# Patient Record
Sex: Female | Born: 1935
Health system: Southern US, Community
[De-identification: ages and names within clinical notes are randomized; demographics above are authoritative.]

## PROBLEM LIST (undated history)

## (undated) DIAGNOSIS — E7800 Pure hypercholesterolemia, unspecified: Secondary | ICD-10-CM

## (undated) DIAGNOSIS — I447 Left bundle-branch block, unspecified: Secondary | ICD-10-CM

## (undated) DIAGNOSIS — R55 Syncope and collapse: Secondary | ICD-10-CM

## (undated) DIAGNOSIS — E559 Vitamin D deficiency, unspecified: Secondary | ICD-10-CM

## (undated) DIAGNOSIS — N183 Chronic kidney disease, stage 3 (moderate): Secondary | ICD-10-CM

## (undated) DIAGNOSIS — I10 Essential (primary) hypertension: Secondary | ICD-10-CM

## (undated) DIAGNOSIS — H3530 Unspecified macular degeneration: Secondary | ICD-10-CM

## (undated) DIAGNOSIS — D649 Anemia, unspecified: Secondary | ICD-10-CM

## (undated) DIAGNOSIS — K219 Gastro-esophageal reflux disease without esophagitis: Secondary | ICD-10-CM

## (undated) HISTORY — DX: Syncope and collapse: R55

## (undated) HISTORY — DX: Anemia, unspecified: D64.9

## (undated) HISTORY — DX: Chronic kidney disease, stage 3 (moderate): N18.3

## (undated) HISTORY — DX: Gastro-esophageal reflux disease without esophagitis: K21.9

## (undated) HISTORY — DX: Unspecified macular degeneration: H35.30

## (undated) HISTORY — DX: Vitamin D deficiency, unspecified: E55.9

## (undated) HISTORY — DX: Left bundle-branch block, unspecified: I44.7

---

## 2015-05-23 DIAGNOSIS — R51 Headache: Secondary | ICD-10-CM | POA: Diagnosis not present

## 2015-05-23 DIAGNOSIS — E784 Other hyperlipidemia: Secondary | ICD-10-CM | POA: Diagnosis not present

## 2015-05-23 DIAGNOSIS — J309 Allergic rhinitis, unspecified: Secondary | ICD-10-CM | POA: Diagnosis not present

## 2015-05-23 DIAGNOSIS — I10 Essential (primary) hypertension: Secondary | ICD-10-CM | POA: Diagnosis not present

## 2015-05-24 DIAGNOSIS — H353221 Exudative age-related macular degeneration, left eye, with active choroidal neovascularization: Secondary | ICD-10-CM | POA: Diagnosis not present

## 2015-08-16 DIAGNOSIS — H35361 Drusen (degenerative) of macula, right eye: Secondary | ICD-10-CM | POA: Diagnosis not present

## 2015-08-16 DIAGNOSIS — H353221 Exudative age-related macular degeneration, left eye, with active choroidal neovascularization: Secondary | ICD-10-CM | POA: Diagnosis not present

## 2015-08-26 DIAGNOSIS — Z79891 Long term (current) use of opiate analgesic: Secondary | ICD-10-CM | POA: Diagnosis not present

## 2015-08-26 DIAGNOSIS — I10 Essential (primary) hypertension: Secondary | ICD-10-CM | POA: Diagnosis not present

## 2015-08-26 DIAGNOSIS — E784 Other hyperlipidemia: Secondary | ICD-10-CM | POA: Diagnosis not present

## 2015-08-26 DIAGNOSIS — K219 Gastro-esophageal reflux disease without esophagitis: Secondary | ICD-10-CM | POA: Diagnosis not present

## 2015-10-05 DIAGNOSIS — Z1231 Encounter for screening mammogram for malignant neoplasm of breast: Secondary | ICD-10-CM | POA: Diagnosis not present

## 2015-10-05 LAB — HM MAMMOGRAPHY

## 2015-10-26 DIAGNOSIS — H353221 Exudative age-related macular degeneration, left eye, with active choroidal neovascularization: Secondary | ICD-10-CM | POA: Diagnosis not present

## 2015-12-30 DIAGNOSIS — E784 Other hyperlipidemia: Secondary | ICD-10-CM | POA: Diagnosis not present

## 2015-12-30 DIAGNOSIS — K635 Polyp of colon: Secondary | ICD-10-CM | POA: Diagnosis not present

## 2015-12-30 DIAGNOSIS — I10 Essential (primary) hypertension: Secondary | ICD-10-CM | POA: Diagnosis not present

## 2015-12-30 DIAGNOSIS — Z79891 Long term (current) use of opiate analgesic: Secondary | ICD-10-CM | POA: Diagnosis not present

## 2015-12-30 LAB — HEPATIC FUNCTION PANEL
ALK PHOS: 84 (ref 25–125)
ALT: 14 (ref 7–35)
AST: 18 (ref 13–35)
BILIRUBIN, TOTAL: 0.7

## 2015-12-30 LAB — LIPID PANEL
Cholesterol: 177 (ref 0–200)
HDL: 59 (ref 35–70)
LDL CALC: 87
LDl/HDL Ratio: 1.5
TRIGLYCERIDES: 156 (ref 40–160)

## 2015-12-30 LAB — CBC AND DIFFERENTIAL
HCT: 41 (ref 36–46)
Hemoglobin: 13.1 (ref 12.0–16.0)
Platelets: 289 (ref 150–399)
WBC: 7.8

## 2015-12-30 LAB — BASIC METABOLIC PANEL
BUN: 16 (ref 4–21)
CREATININE: 1.4 — AB (ref <=1.1)
GLUCOSE: 97
Potassium: 4.3 (ref 3.4–5.3)
Sodium: 141 (ref 137–147)

## 2016-01-17 DIAGNOSIS — H353221 Exudative age-related macular degeneration, left eye, with active choroidal neovascularization: Secondary | ICD-10-CM | POA: Diagnosis not present

## 2016-01-17 DIAGNOSIS — H35363 Drusen (degenerative) of macula, bilateral: Secondary | ICD-10-CM | POA: Diagnosis not present

## 2016-01-25 DIAGNOSIS — Z86010 Personal history of colonic polyps: Secondary | ICD-10-CM | POA: Diagnosis not present

## 2016-01-25 DIAGNOSIS — K219 Gastro-esophageal reflux disease without esophagitis: Secondary | ICD-10-CM | POA: Diagnosis not present

## 2016-03-12 DIAGNOSIS — Z0000 Encounter for general adult medical examination without abnormal findings: Secondary | ICD-10-CM | POA: Diagnosis not present

## 2016-03-20 DIAGNOSIS — H26493 Other secondary cataract, bilateral: Secondary | ICD-10-CM | POA: Diagnosis not present

## 2016-04-06 DIAGNOSIS — L259 Unspecified contact dermatitis, unspecified cause: Secondary | ICD-10-CM | POA: Diagnosis not present

## 2016-04-06 DIAGNOSIS — I10 Essential (primary) hypertension: Secondary | ICD-10-CM | POA: Diagnosis not present

## 2016-04-06 DIAGNOSIS — M25519 Pain in unspecified shoulder: Secondary | ICD-10-CM | POA: Diagnosis not present

## 2016-04-11 DIAGNOSIS — H353221 Exudative age-related macular degeneration, left eye, with active choroidal neovascularization: Secondary | ICD-10-CM | POA: Diagnosis not present

## 2016-07-04 ENCOUNTER — Encounter (INDEPENDENT_AMBULATORY_CARE_PROVIDER_SITE_OTHER): Payer: Medicare Other | Admitting: Ophthalmology

## 2016-07-04 DIAGNOSIS — H43813 Vitreous degeneration, bilateral: Secondary | ICD-10-CM | POA: Diagnosis not present

## 2016-07-04 DIAGNOSIS — H353221 Exudative age-related macular degeneration, left eye, with active choroidal neovascularization: Secondary | ICD-10-CM

## 2016-07-04 DIAGNOSIS — H35033 Hypertensive retinopathy, bilateral: Secondary | ICD-10-CM

## 2016-07-04 DIAGNOSIS — I10 Essential (primary) hypertension: Secondary | ICD-10-CM | POA: Diagnosis not present

## 2016-07-25 DIAGNOSIS — I10 Essential (primary) hypertension: Secondary | ICD-10-CM | POA: Diagnosis not present

## 2016-07-25 DIAGNOSIS — M159 Polyosteoarthritis, unspecified: Secondary | ICD-10-CM | POA: Diagnosis not present

## 2016-07-25 DIAGNOSIS — K219 Gastro-esophageal reflux disease without esophagitis: Secondary | ICD-10-CM | POA: Diagnosis not present

## 2016-07-25 DIAGNOSIS — E785 Hyperlipidemia, unspecified: Secondary | ICD-10-CM | POA: Diagnosis not present

## 2016-09-01 ENCOUNTER — Emergency Department (HOSPITAL_COMMUNITY): Payer: Medicare Other

## 2016-09-01 ENCOUNTER — Encounter (HOSPITAL_COMMUNITY): Payer: Self-pay | Admitting: *Deleted

## 2016-09-01 ENCOUNTER — Observation Stay (HOSPITAL_COMMUNITY)
Admission: EM | Admit: 2016-09-01 | Discharge: 2016-09-02 | Disposition: A | Discharge status: Home / Self Care | Payer: Medicare Other | Attending: Internal Medicine | Admitting: Internal Medicine

## 2016-09-01 ENCOUNTER — Observation Stay (HOSPITAL_COMMUNITY): Payer: Medicare Other

## 2016-09-01 DIAGNOSIS — R404 Transient alteration of awareness: Secondary | ICD-10-CM | POA: Diagnosis not present

## 2016-09-01 DIAGNOSIS — Z79899 Other long term (current) drug therapy: Secondary | ICD-10-CM | POA: Insufficient documentation

## 2016-09-01 DIAGNOSIS — M25512 Pain in left shoulder: Secondary | ICD-10-CM | POA: Insufficient documentation

## 2016-09-01 DIAGNOSIS — R52 Pain, unspecified: Secondary | ICD-10-CM

## 2016-09-01 DIAGNOSIS — I447 Left bundle-branch block, unspecified: Secondary | ICD-10-CM | POA: Diagnosis not present

## 2016-09-01 DIAGNOSIS — N1832 Chronic kidney disease, stage 3b: Secondary | ICD-10-CM | POA: Diagnosis present

## 2016-09-01 DIAGNOSIS — R42 Dizziness and giddiness: Secondary | ICD-10-CM | POA: Diagnosis not present

## 2016-09-01 DIAGNOSIS — N179 Acute kidney failure, unspecified: Secondary | ICD-10-CM | POA: Diagnosis not present

## 2016-09-01 DIAGNOSIS — R55 Syncope and collapse: Principal | ICD-10-CM | POA: Insufficient documentation

## 2016-09-01 DIAGNOSIS — I10 Essential (primary) hypertension: Secondary | ICD-10-CM | POA: Insufficient documentation

## 2016-09-01 DIAGNOSIS — E785 Hyperlipidemia, unspecified: Secondary | ICD-10-CM | POA: Diagnosis not present

## 2016-09-01 DIAGNOSIS — N183 Chronic kidney disease, stage 3 unspecified: Secondary | ICD-10-CM

## 2016-09-01 DIAGNOSIS — Z7982 Long term (current) use of aspirin: Secondary | ICD-10-CM | POA: Diagnosis not present

## 2016-09-01 HISTORY — DX: Syncope and collapse: R55

## 2016-09-01 HISTORY — DX: Left bundle-branch block, unspecified: I44.7

## 2016-09-01 HISTORY — DX: Chronic kidney disease, stage 3 unspecified: N18.30

## 2016-09-01 HISTORY — DX: Essential (primary) hypertension: I10

## 2016-09-01 HISTORY — DX: Pure hypercholesterolemia, unspecified: E78.00

## 2016-09-01 LAB — BASIC METABOLIC PANEL
Anion gap: 9 (ref 5–15)
BUN: 18 mg/dL (ref 6–20)
CHLORIDE: 105 mmol/L (ref 101–111)
CO2: 25 mmol/L (ref 22–32)
Calcium: 9 mg/dL (ref 8.9–10.3)
Creatinine, Ser: 1.4 mg/dL — ABNORMAL HIGH (ref 0.44–1.00)
GFR calc Af Amer: 39 mL/min — ABNORMAL LOW (ref >60)
GFR calc non Af Amer: 33 mL/min — ABNORMAL LOW (ref >60)
Glucose, Bld: 99 mg/dL (ref 65–99)
POTASSIUM: 4.2 mmol/L (ref 3.5–5.1)
SODIUM: 139 mmol/L (ref 135–145)

## 2016-09-01 LAB — CBC
HCT: 35.5 % — ABNORMAL LOW (ref 36.0–46.0)
HEMATOCRIT: 36.6 % (ref 36.0–46.0)
Hemoglobin: 12.1 g/dL (ref 12.0–15.0)
Hemoglobin: 11.8 g/dL — ABNORMAL LOW (ref 12.0–15.0)
MCH: 27 pg (ref 26.0–34.0)
MCH: 26.9 pg (ref 26.0–34.0)
MCHC: 33.1 g/dL (ref 30.0–36.0)
MCHC: 33.2 g/dL (ref 30.0–36.0)
MCV: 81.7 fL (ref 78.0–100.0)
MCV: 80.9 fL (ref 78.0–100.0)
Platelets: 284 10*3/uL (ref 150–400)
Platelets: 302 10*3/uL (ref 150–400)
RBC: 4.48 MIL/uL (ref 3.87–5.11)
RBC: 4.39 MIL/uL (ref 3.87–5.11)
RDW: 13.7 % (ref 11.5–15.5)
RDW: 13.7 % (ref 11.5–15.5)
WBC: 7.4 10*3/uL (ref 4.0–10.5)
WBC: 9.1 10*3/uL (ref 4.0–10.5)

## 2016-09-01 LAB — D-DIMER, QUANTITATIVE: D-Dimer, Quant: 0.4 ug/mL-FEU (ref 0.00–0.50)

## 2016-09-01 LAB — CREATININE, SERUM
CREATININE: 1.21 mg/dL — AB (ref 0.44–1.00)
GFR calc Af Amer: 46 mL/min — ABNORMAL LOW (ref >60)
GFR calc non Af Amer: 40 mL/min — ABNORMAL LOW (ref >60)

## 2016-09-01 LAB — I-STAT TROPONIN, ED: Troponin i, poc: 0 ng/mL (ref 0.00–0.08)

## 2016-09-01 LAB — TROPONIN I

## 2016-09-01 IMAGING — DX DG SHOULDER 2+V*L*
3 series · 4 of 4 positions shown · non-contrast
Comparison: None.

CLINICAL DATA: Left shoulder pain for 3 weeks, no known injury.

EXAM:
LEFT SHOULDER - 2+ VIEW

[Series 1: shoulder grashey · 0.14mm/px · 2 of 2 slices shown]
[im 1/2]
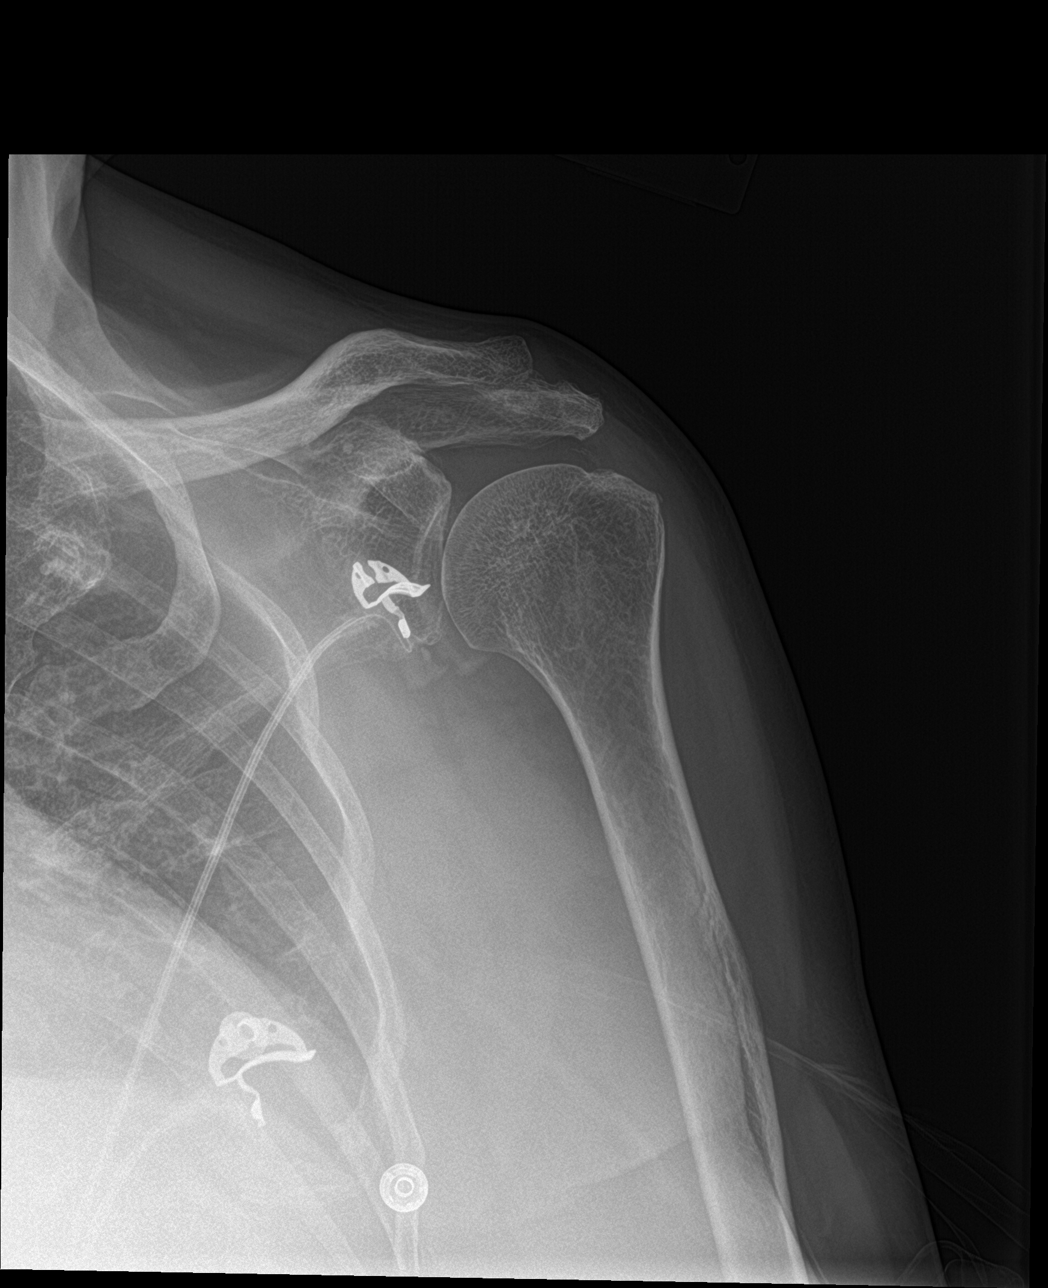
[im 2/2]
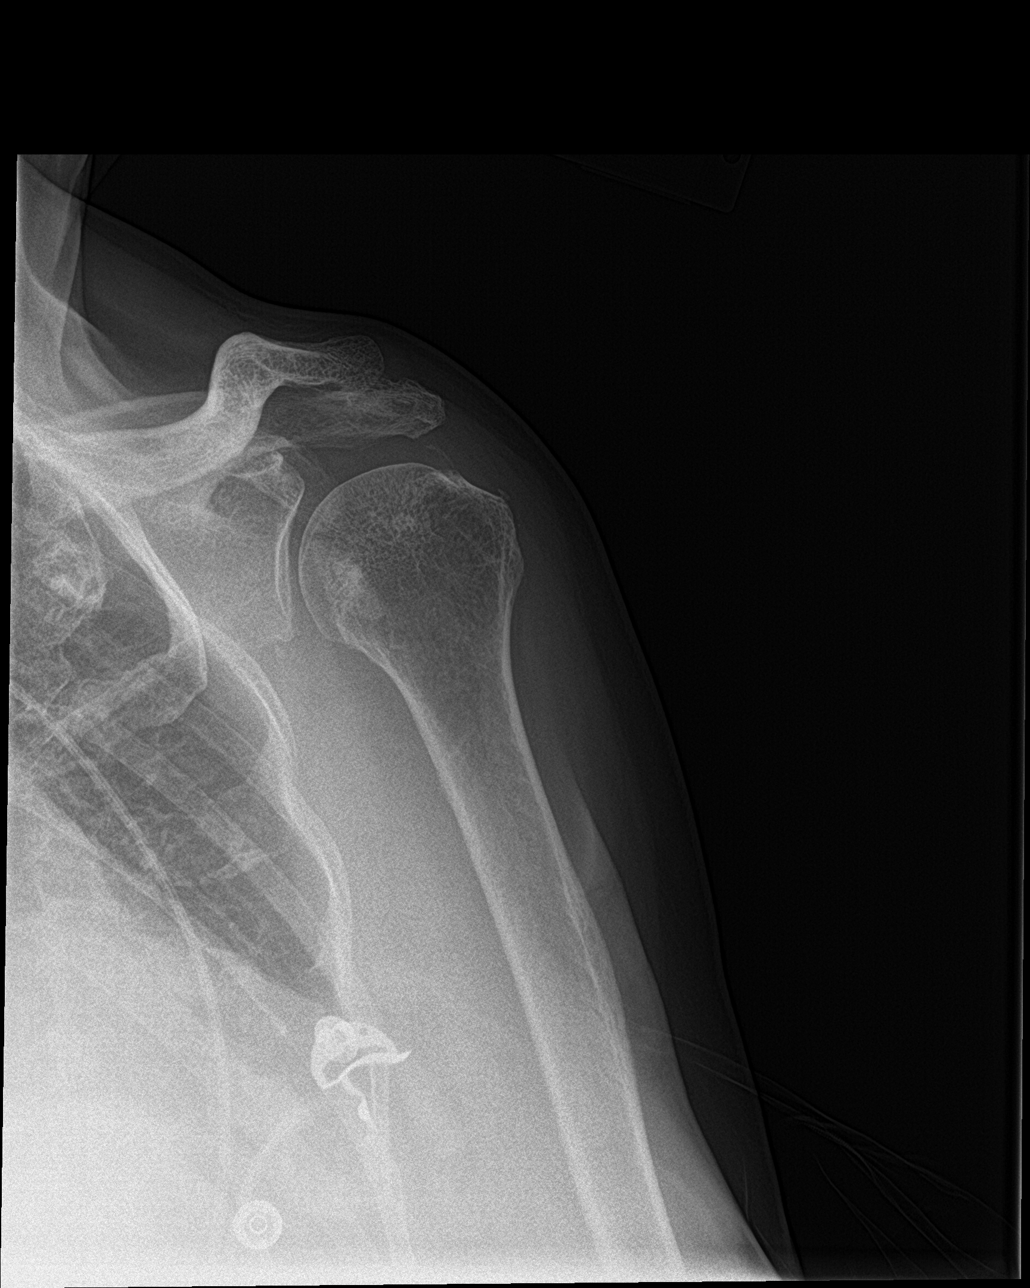

[shoulder y view]
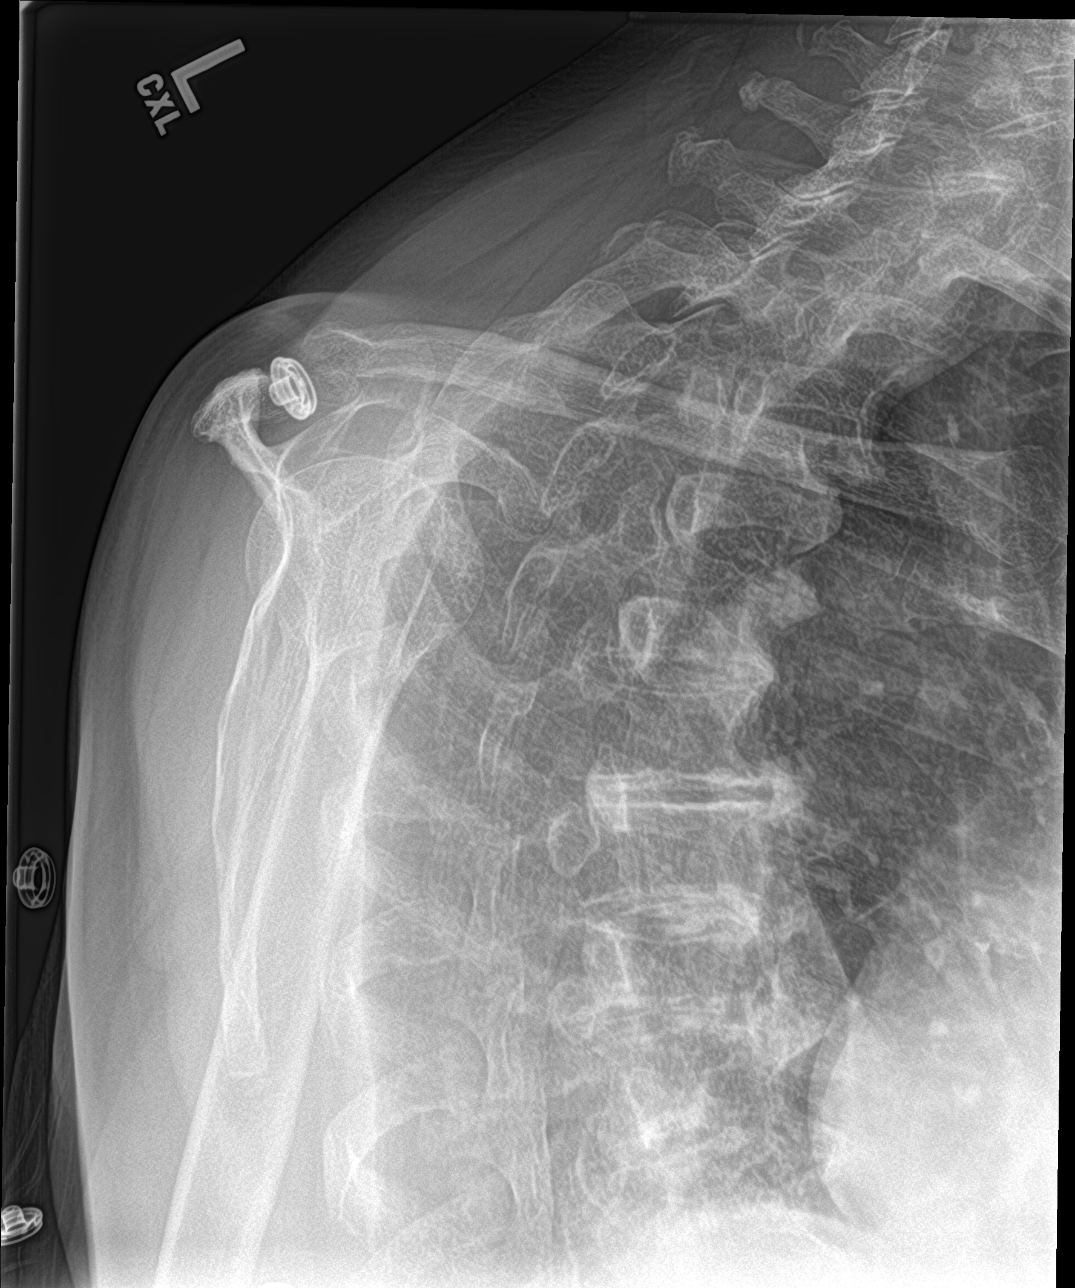

[shoulder axillary]
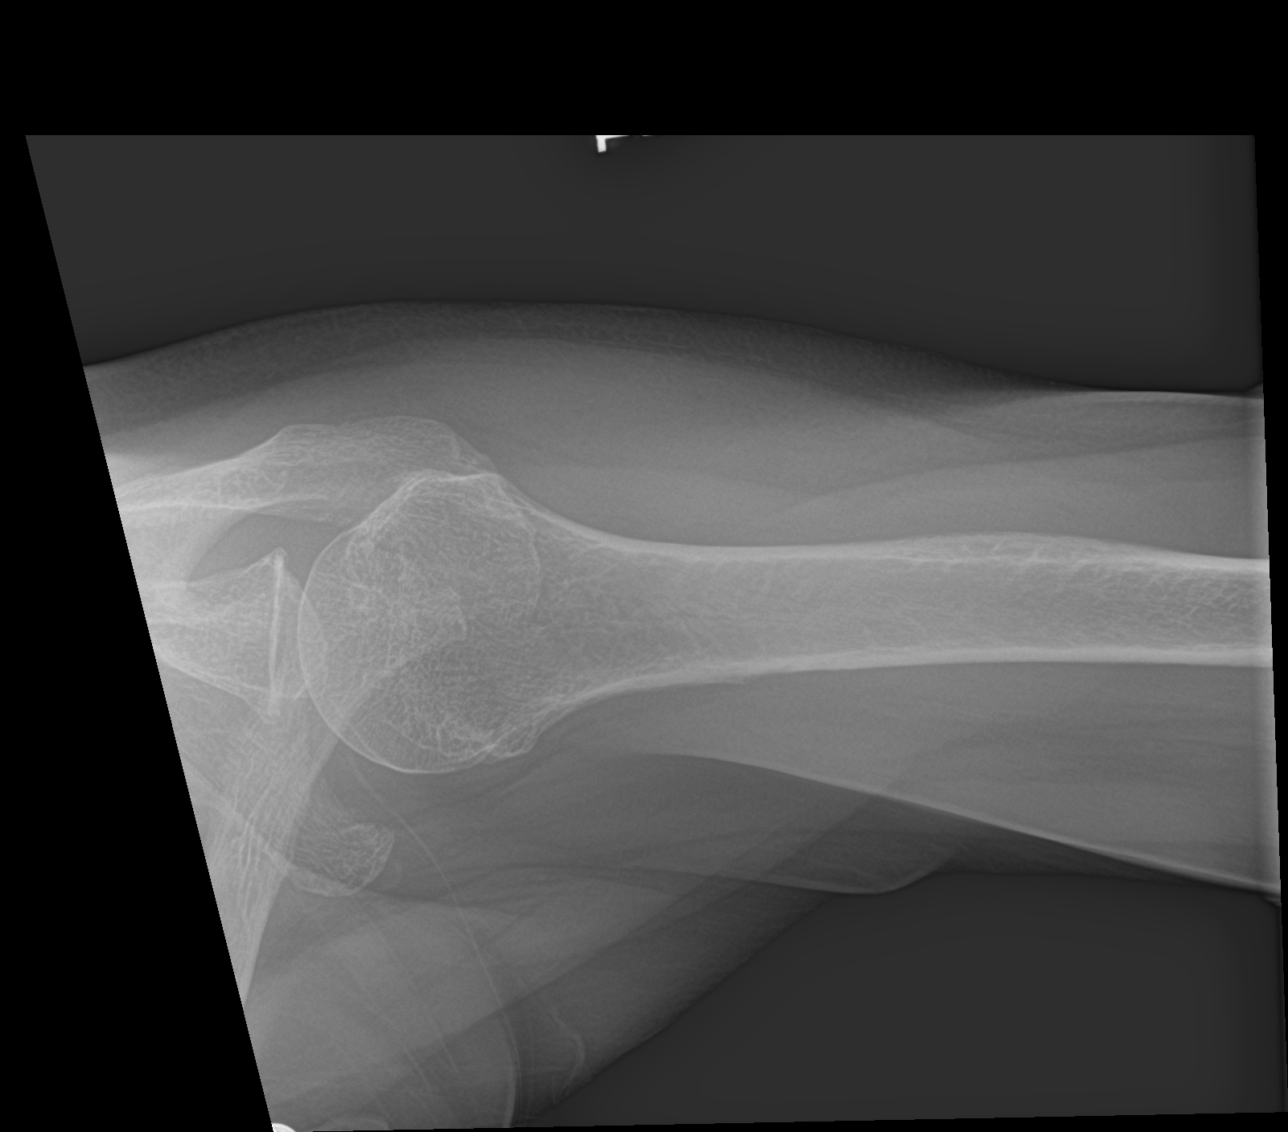

[4 of 4 positions shown; findings below may reference images not displayed]

FINDINGS: Osseous alignment is normal. No fracture seen. Mild degenerative
spurring noted along the inferior margin of the glenoid. Subtle
calcific density overlies the left humeral head, likely related to
chronic calcific tendinopathy of the rotator cuff.
IMPRESSION: 1. No acute findings.  No osseous fracture or dislocation.
2. Subtle calcific changes overlying the left humeral head,
suggesting chronic calcific tendinopathy of the rotator cuff.
Probable additional degenerative spurring along the inferior margin
of the glenoid labrum.

## 2016-09-01 MED ORDER — ASPIRIN EC 81 MG PO TBEC
81.0000 mg | DELAYED_RELEASE_TABLET | Freq: Every day | ORAL | Status: DC
  Administered 2016-09-01 – 2016-09-02 (×2): 81 mg via ORAL
  Filled 2016-09-01 (×2): qty 1

## 2016-09-01 MED ORDER — AMLODIPINE BESYLATE 5 MG PO TABS
5.0000 mg | ORAL_TABLET | Freq: Every day | ORAL | Status: DC
  Administered 2016-09-01 – 2016-09-02 (×2): 5 mg via ORAL
  Filled 2016-09-01 (×2): qty 1

## 2016-09-01 MED ORDER — PRAVASTATIN SODIUM 20 MG PO TABS
20.0000 mg | ORAL_TABLET | Freq: Every day | ORAL | Status: DC
  Administered 2016-09-01 – 2016-09-02 (×2): 20 mg via ORAL
  Filled 2016-09-01 (×2): qty 1

## 2016-09-01 MED ORDER — ACETAMINOPHEN 325 MG PO TABS: 650.0000 mg | ORAL_TABLET | Freq: Four times a day (QID) | ORAL | Status: DC | PRN

## 2016-09-01 MED ORDER — SODIUM CHLORIDE 0.9% FLUSH
3.0000 mL | Freq: Two times a day (BID) | INTRAVENOUS | Status: DC
  Administered 2016-09-02: 3 mL via INTRAVENOUS

## 2016-09-01 MED ORDER — CHOLECALCIFEROL 25 MCG (1000 UT) PO CAPS: 1000.0000 [IU] | ORAL_CAPSULE | Freq: Every day | ORAL | Status: DC

## 2016-09-01 MED ORDER — OMEGA-3-ACID ETHYL ESTERS 1 G PO CAPS
1.0000 g | ORAL_CAPSULE | Freq: Two times a day (BID) | ORAL | Status: DC
  Administered 2016-09-01 – 2016-09-02 (×2): 1 g via ORAL
  Filled 2016-09-01 (×2): qty 1

## 2016-09-01 MED ORDER — VITAMIN D 1000 UNITS PO TABS
1000.0000 [IU] | ORAL_TABLET | Freq: Every day | ORAL | Status: DC
  Administered 2016-09-02: 1000 [IU] via ORAL
  Filled 2016-09-01 (×2): qty 1

## 2016-09-01 MED ORDER — ACETAMINOPHEN 650 MG RE SUPP: 650.0000 mg | Freq: Four times a day (QID) | RECTAL | Status: DC | PRN

## 2016-09-01 MED ORDER — PANTOPRAZOLE SODIUM 40 MG PO TBEC
40.0000 mg | DELAYED_RELEASE_TABLET | Freq: Every day | ORAL | Status: DC
  Administered 2016-09-01 – 2016-09-02 (×2): 40 mg via ORAL
  Filled 2016-09-01 (×2): qty 1

## 2016-09-01 MED ORDER — ENOXAPARIN SODIUM 30 MG/0.3ML ~~LOC~~ SOLN
30.0000 mg | SUBCUTANEOUS | Status: DC
  Administered 2016-09-01: 30 mg via SUBCUTANEOUS
  Filled 2016-09-01: qty 0.3

## 2016-09-01 MED ORDER — SODIUM CHLORIDE 0.9 % IV SOLN
INTRAVENOUS | Status: DC
  Administered 2016-09-01 – 2016-09-02 (×2): via INTRAVENOUS

## 2016-09-01 NOTE — ED Provider Notes (Signed)
[facility]-EMERGENCY DEPT Provider Note   CSN: [id number] Arrival date & time: 09/01/16  1307     History   Chief Complaint Chief Complaint  Patient presents with  . Loss of Consciousness  . Chest Pain    HPI Whitney Davidson is a 83 y.o. female.  HPI  83 year old female history of hypertension and high cholesterol who presents today complaining of left shoulder pain and syncope. She states that she is having intermittent left shoulder pain for the past month. It comes and goes. She cannot tell you what brings it on. She has had no trauma. Pain is posterior aspect of the shoulder but is not reproducible. Today she was sitting and talking to her nephew when she stated that she felt lightheaded. She then had a syncopal episode slumping in the chair. He caught her before she fell out. He states that last about 10 seconds. There is no evidence of seizure activity without loss of bowel or bladder control. After the event she was awake and alert. She is not currently having left shoulder pain. She has not had any previous syncopal episodes.  Past Medical History:  Diagnosis Date  . High cholesterol   . Hypertension     There are no active problems to display for this patient.   History reviewed. No pertinent surgical history.  OB History    No data available       Home Medications    Prior to Admission medications   Not on File    Family History No family history on file.  Social History Social History  Substance Use Topics  . Smoking status: Never Smoker  . Smokeless tobacco: Never Used  . Alcohol use No     Allergies   Patient has no allergy information on record.   Review of Systems Review of Systems  All other systems reviewed and are negative.    Physical Exam Updated Vital Signs BP (!) 158/71   Pulse 63   Temp 98.4 F (36.9 C) (Oral)   Resp 11   SpO2 100%   Physical Exam  Constitutional: She is oriented to person, place, and time. She appears  well-developed and well-nourished. No distress.  HENT:  Head: Normocephalic and atraumatic.  Right Ear: External ear normal.  Left Ear: External ear normal.  Mouth/Throat: Oropharynx is clear and moist.  Eyes: EOM are normal. Pupils are equal, round, and reactive to light.  Neck: Normal range of motion. Neck supple.  Cardiovascular: Normal rate, regular rhythm and normal heart sounds.   Pulmonary/Chest: Effort normal.  Abdominal: Soft.  Musculoskeletal: Normal range of motion.  Neurological: She is alert and oriented to person, place, and time.  Nursing note and vitals reviewed.    ED Treatments / Results  Labs (all labs ordered are listed, but only abnormal results are displayed) Labs Reviewed  CBC  BASIC METABOLIC PANEL  I-STAT TROPOININ, ED    EKG  EKG Interpretation  Date/Time:  Saturday Sep 01 2016 13:14:47 EDT Ventricular Rate:  62 PR Interval:    QRS Duration: 128 QT Interval:  443 QTC Calculation: 450 R Axis:   62 Text Interpretation:  Sinus or ectopic atrial rhythm Prolonged PR interval Left bundle branch block Confirmed by Tyjuan Demetro MD, Duwayne Heck ([id number]) on 09/01/2016 1:24:13 PM Also confirmed by Breanna Mcdaniel MD, Duwayne Heck [phone]), editor Misty Stanley [id number])  on 09/01/2016 1:40:31 PM       Radiology Dg Chest 2 View  Result Date: 09/01/2016 CLINICAL DATA:  Syncope.  Left arm  pain. EXAM: CHEST  2 VIEW COMPARISON:  None. FINDINGS: The heart size and mediastinal contours are within normal limits. Both lungs are clear. The visualized skeletal structures are unremarkable. IMPRESSION: No active cardiopulmonary disease. Electronically Signed   By: Gerome Samavid  Williams III M.D   On: 09/01/2016 14:09    Procedures Procedures (including critical care time)  Medications Ordered in ED Medications - No data to display   Initial Impression / Assessment and Plan / ED Course  I have reviewed the triage vital signs and the nursing notes.  Pertinent labs & imaging results that were  available during my care of the patient were reviewed by me and considered in my medical decision making (see chart for details).     Patient with left bundle branch block on EKG and no old to compare. Patient is not considered low risk [facility]. Plan admission to unassigned medicine for further evaluation. She has remained hemodynamically stable here in ED. I discussed plan with patient and family.  Final Clinical Impressions(s) / ED Diagnoses   Final diagnoses:  Syncope, unspecified syncope type   Left shoulder pain Discussed with hospitalist and will see for admission New Prescriptions New Prescriptions   No medications on file     Margarita Grizzleay, Jacie Tristan, MD 09/01/16 1541

## 2016-09-01 NOTE — H&P (Addendum)
History and Physical        Hospital Admission Note Date: 09/01/2016  Patient name: Whitney Davidson Medical record number: [id number] Date of birth: 01/22/1931 Age: 83 y.o. Gender: female  PCP:  just relocated from [place], has not established any PCP yet.     Patient coming from: Home   Chief Complaint:  Passed out at home  HPI: Patient is a 83 year old female with history of hypertension, hyperlipidemia, GERD presented from home today with the syncopal episode. 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 Her niece's husband went to get his wife who lives next door. When she arrived, patient had another episode of dizziness and subsequently slumped in the chair for about 10 seconds. Patient denied any chest pain, palpitations, shortness of breath or any diaphoresis. She reported nausea and one episode of vomiting. Patient's family did not notice any seizure activity or loss of bowel or bladder. Patient was alert and oriented after she gained consciousness. Patient denies any prior episodes of syncopal episodes. Per family, she did not have any breakfast yet this morning. Also patient has just relocated 3 weeks ago from [place], she has not established any PCP yet. Patient also has been complaining of left shoulder and arm pain off and on for last 1 month. Per patient's family, she was also complaining of the pain today. However during my examination, patient has no pain. Per patient, the pain feels similar to the prior episodes of bursitis in her right shoulder in the past.  ED work-up/course:  Temp 98.4, pulse 60, BP 145/68 at the time of triage EKG showed rate 62, prolonged PR interval, left bundle branch block, no prior EKG  to compare with BMET showed BUN 18, creatinine 1.4, no prior labs to compare with CBC unremarkable, troponin 0.0  Review of Systems: Positives marked in 'bold' Constitutional: Denies fever, chills, diaphoresis, poor appetite and fatigue.  HEENT: Denies photophobia, eye pain, redness, hearing loss, ear pain, congestion, sore throat, rhinorrhea, sneezing, mouth sores, trouble swallowing, neck pain, neck stiffness and tinnitus.   Respiratory: Denies SOB, DOE, cough, chest tightness,  and wheezing.   Cardiovascular: Denies chest pain, palpitations and leg swelling.  Gastrointestinal: Denies nausea, vomiting, abdominal pain, diarrhea, constipation, blood in stool and abdominal distention.  Genitourinary: Denies dysuria, urgency, frequency, hematuria, flank pain and difficulty urinating.  Musculoskeletal: Denies myalgias, back pain, joint swelling, arthralgias and gait problem. + left shoulder and arm pain for last 1 month off and on Skin: Denies pallor, rash and wound.  Neurological: Please see history of present illness, denies any seizures Hematological: Denies adenopathy. Easy bruising, personal or family bleeding history  Psychiatric/Behavioral: Denies suicidal ideation, mood changes, confusion, nervousness, sleep disturbance and agitation  Past Medical History: Past Medical History:  Diagnosis Date  . High cholesterol   . Hypertension     History reviewed. No pertinent surgical history.  Medications: Prior to Admission medications   Medication Sig Start Date End Date Taking? Authorizing Provider  amLODipine (NORVASC) 5 MG tablet Take 5  mg by mouth daily.   Yes [provider]  aspirin EC 81 MG tablet Take 81 mg by mouth daily.   Yes [provider]  bisoprolol (ZEBETA) 5 MG tablet Take 5 mg by mouth daily.   Yes [provider]  Cholecalciferol (D3-1000) 1000 units capsule Take 1,000 Units by mouth daily.   Yes [provider]  Omega-3 Fatty Acids  (FISH OIL) 1000 MG CAPS Take 1,000 mg by mouth 2 (two) times daily.   Yes [provider]  omeprazole (PRILOSEC) 40 MG capsule Take 40 mg by mouth daily before breakfast.   Yes [provider]  pravastatin (PRAVACHOL) 20 MG tablet Take 20 mg by mouth daily.   Yes [provider]    Allergies:   Allergies  Allergen Reactions  . Lisinopril-Hydrochlorothiazide Swelling    Lips became swollen  . Penicillins     Has patient had a PCN reaction causing immediate rash, facial/tongue/throat swelling, SOB or lightheadedness with hypotension: Yes Has patient had a PCN reaction causing severe rash involving mucus membranes or skin necrosis: Unknown Has patient had a PCN reaction that required hospitalization: Unknown Has patient had a PCN reaction occurring within the last 10 years: No If all of the above answers are "NO", then may proceed with Cephalosporin use.     Social History:  reports that she has never smoked. She has never used smokeless tobacco. She reports that she does not drink alcohol or use drugs.Currently lives at home, patient's niece and her husband lives next door  Family History: The patient reports that her brother had coronary disease and diabetes mellitus Her mother had coronary artery disease No history of cancers in the family  Physical Exam: Blood pressure 133/65, pulse 66, temperature 98 F (36.7 C), temperature source Oral, resp. rate 11, SpO2 97 %. General: Alert, awake, oriented x3, in no acute distress. HEENT: normocephalic, atraumatic, anicteric sclera, pink conjunctiva, pupils equal and reactive to light and accomodation, oropharynx clear Neck: supple, no masses or lymphadenopathy, no goiter, no bruits  Heart: Regular rate and rhythm, without murmurs, rubs or gallops. Lungs: Clear to auscultation bilaterally, no wheezing, rales or rhonchi. Abdomen: Soft, nontender, nondistended, positive bowel sounds, no masses. Extremities: No  clubbing, cyanosis or edema with positive pedal pulses. Range of movement intact in the left shoulder. Neuro: Grossly intact, no focal neurological deficits, strength 5/5 upper and lower extremities bilaterally Psych: alert and oriented x 3, normal mood and affect Skin: no rashes or lesions, warm and dry   LABS on Admission:  Basic Metabolic Panel:  Recent Labs Lab 09/01/16 1330  NA 139  K 4.2  CL 105  CO2 25  GLUCOSE 99  BUN 18  CREATININE 1.40*  CALCIUM 9.0   Liver Function Tests: No results for input(s): AST, ALT, ALKPHOS, BILITOT, PROT, ALBUMIN in the last 168 hours. No results for input(s): LIPASE, AMYLASE in the last 168 hours. No results for input(s): AMMONIA in the last 168 hours. CBC:  Recent Labs Lab 09/01/16 1330  WBC 7.4  HGB 12.1  HCT 36.6  MCV 81.7  PLT 284   Cardiac Enzymes: No results for input(s): CKTOTAL, CKMB, CKMBINDEX, TROPONINI in the last 168 hours. BNP: Invalid input(s): POCBNP CBG: No results for input(s): GLUCAP in the last 168 hours.  Radiological Exams on Admission:  CLINICAL DATA:  Syncope.  Left arm pain.  EXAM: CHEST  2 VIEW  COMPARISON:  None.  FINDINGS: The heart size and mediastinal contours are within  normal limits. Both lungs are clear. The visualized skeletal structures are unremarkable.  IMPRESSION: No active cardiopulmonary disease.   *I have personally reviewed the images above*  EKG: Independently reviewed. EKG showed rate 62, prolonged PR interval, left bundle branch block, no prior EKG to compare with.    Assessment/Plan Principal Problem:   Syncope: Likely vasovagal, however possibly due to dehydration as patient also has acute kidney injury, rule out cardiac  - Admit for observation under telemetry, obtain serial cardiac enzymes, d-dimer - EKG showed rate 62 with prolonged PR interval and LBBB, no prior EKG to compare with, currently troponin 0.0. Will obtain repeat EKG in the morning, hold beta  blocker.  - Obtain 2-D echocardiogram for further workup. Discussed with the Dr. Jens Somrenshaw with cardiology regarding LBBB with no prior EKG (official consult not obtained), agreed with the current management.   Active Problems:   Essential hypertension - BP currently stable, for now continue amlodipine, hold beta blocker due to bradycardia    Hyperlipidemia -Continue statin, obtain lipid panel     LBBB (left bundle branch block) - No old EKG to compare with, obtain 2-D echocardiogram for further workup and management as #1    AKI (acute kidney injury) (HCC) - Place on gentle hydration, no prior creatinine function to compare with - will follow with BMET in a.m.   DVT prophylaxis: Lovenox   CODE STATUS: Full CODE STATUS   Consults called:   Family Communication: Admission, patients condition and plan of care including tests being ordered have been discussed with the patient, niece, niece's husband who indicates understanding and agree with the plan and Code Status  Admission status:   Disposition plan: Further plan will depend as patient's clinical course evolves and further radiologic and laboratory data become available. Likely home tomorrow  At the time of admission, it appears that the appropriate admission status for this patient is observation.  This is judged to be reasonable and necessary in order to provide the required intensity of service to ensure the patient's safety given the presenting symptoms syncopal episode, left shoulder pain, physical exam findings, and initial radiographic and laboratory data, creatinine 1.4, EKG,  in the context of their chronic comorbidities.     Time Spent on Admission: 65 minutes    Anesa Fronek M.D. [facility] 09/01/2016, 4:50 PM Pager: [phone]  If 7PM-7AM, please contact night-coverage [email or web address] Password [email or web address]

## 2016-09-01 NOTE — ED Triage Notes (Signed)
Pt in from home c/o L CP & L arm pain onset x 2 wks, reports witnessed syncopal episode today lasting 30 secs denies hitting head, A&O x4 upon arrival to ED, baseline ambulatory, rcvd 324 mg ASA pta, CBG 131

## 2016-09-02 ENCOUNTER — Observation Stay (HOSPITAL_BASED_OUTPATIENT_CLINIC_OR_DEPARTMENT_OTHER): Payer: Medicare Other

## 2016-09-02 ENCOUNTER — Encounter (HOSPITAL_COMMUNITY): Payer: Self-pay | Admitting: *Deleted

## 2016-09-02 DIAGNOSIS — E785 Hyperlipidemia, unspecified: Secondary | ICD-10-CM | POA: Diagnosis not present

## 2016-09-02 DIAGNOSIS — R52 Pain, unspecified: Secondary | ICD-10-CM | POA: Diagnosis not present

## 2016-09-02 DIAGNOSIS — R55 Syncope and collapse: Secondary | ICD-10-CM

## 2016-09-02 DIAGNOSIS — I10 Essential (primary) hypertension: Secondary | ICD-10-CM | POA: Diagnosis not present

## 2016-09-02 DIAGNOSIS — N179 Acute kidney failure, unspecified: Secondary | ICD-10-CM | POA: Diagnosis not present

## 2016-09-02 DIAGNOSIS — I447 Left bundle-branch block, unspecified: Secondary | ICD-10-CM | POA: Diagnosis not present

## 2016-09-02 LAB — TROPONIN I: Troponin I: <0.03 ng/mL (ref <0.03)

## 2016-09-02 LAB — LIPID PANEL
Cholesterol: 138 mg/dL (ref 0–200)
HDL: 44 mg/dL (ref >40)
LDL Cholesterol: 77 mg/dL (ref 0–99)
TRIGLYCERIDES: 86 mg/dL (ref <150)
Total CHOL/HDL Ratio: 3.1 RATIO
VLDL: 17 mg/dL (ref 0–40)

## 2016-09-02 LAB — ECHOCARDIOGRAM COMPLETE
Height: 62 in
Weight: 2355.2 oz

## 2016-09-02 LAB — BASIC METABOLIC PANEL
ANION GAP: 7 (ref 5–15)
BUN: 14 mg/dL (ref 6–20)
CHLORIDE: 109 mmol/L (ref 101–111)
CO2: 24 mmol/L (ref 22–32)
CREATININE: 1.18 mg/dL — AB (ref 0.44–1.00)
Calcium: 8.5 mg/dL — ABNORMAL LOW (ref 8.9–10.3)
GFR calc non Af Amer: 41 mL/min — ABNORMAL LOW (ref >60)
GFR, EST AFRICAN AMERICAN: 48 mL/min — AB (ref >60)
Glucose, Bld: 97 mg/dL (ref 65–99)
Potassium: 3.9 mmol/L (ref 3.5–5.1)
SODIUM: 140 mmol/L (ref 135–145)

## 2016-09-02 LAB — GLUCOSE, CAPILLARY: Glucose-Capillary: 83 mg/dL (ref 65–99)

## 2016-09-02 MED ORDER — ACETAMINOPHEN 325 MG PO TABS: 650.0000 mg | ORAL_TABLET | Freq: Four times a day (QID) | ORAL | 0 refills | Status: DC | PRN

## 2016-09-02 MED ORDER — BISOPROLOL FUMARATE 5 MG PO TABS: 5.0000 mg | ORAL_TABLET | Freq: Every day | ORAL | Status: DC

## 2016-09-02 MED ORDER — UNABLE TO FIND: 0 refills | Status: DC

## 2016-09-02 NOTE — Evaluation (Signed)
Physical Therapy Evaluation Patient Details Name: Whitney Davidson MRN: [id number] DOB: [birth year] Today's Date: 09/02/2016   History of Present Illness  Patient is a 83 year old female with history of hypertension, hyperlipidemia, GERD presented from home today with the syncopal episode.  Clinical Impression   Patient evaluated by Physical Therapy with no further acute PT needs identified. All education has been completed and the patient has no further questions. We discussed going to Outpt PT for her shoulder -- she will need a prescription for Outpt PT; she can get it from Dr. Tana Coast here (paged Dr. Tana Coast - thanks!) or her PCP (she has plans to establish with one as soon as she can; and then she can call the PT clinic of her choice to set up an appt;  See below for any follow-up Physical Therapy or equipment needs. PT is signing off. Thank you for this referral.     Follow Up Recommendations Outpatient PT;Other (comment) (for L shoulder)    Equipment Recommendations  None recommended by PT    Recommendations for Other Services       Precautions / Restrictions Precautions Precautions: None      Mobility  Bed Mobility                  Transfers Overall transfer level: Independent Equipment used: None                Ambulation/Gait Ambulation/Gait assistance: Modified independent (Device/Increase time) Ambulation Distance (Feet): 150 Feet Assistive device: None Gait Pattern/deviations: Step-through pattern   Gait velocity interpretation: Below normal speed for age/gender General Gait Details: A bit slower cadence, but walking well with no loss of balance  Stairs            Wheelchair Mobility    Modified Rankin (Stroke Patients Only)       Balance Overall balance assessment: No apparent balance deficits (not formally assessed)                                           Pertinent Vitals/Pain Pain Assessment: 0-10 Pain Score: 5  Pain  Location: L shoulder Pain Descriptors / Indicators: Aching Pain Intervention(s): Monitored during session;Ice applied    Home Living Family/patient expects to be discharged to:: Private residence Living Arrangements: Alone Available Help at Discharge: Family;Available PRN/intermittently (Neice lives next door) Type of Home: House Home Access: Level entry     Home Layout: One level Home Equipment: None      Prior Function Level of Independence: Independent         Comments: Recently moved here from [place]: Right    Extremity/Trunk Assessment   Upper Extremity Assessment Upper Extremity Assessment: LUE deficits/detail LUE Deficits / Details: Full shoulder flexion and abduction ROM against gravity, though not pain free    Lower Extremity Assessment Lower Extremity Assessment: Overall WFL for tasks assessed       Communication   Communication: No difficulties  Cognition Arousal/Alertness: Awake/alert Behavior During Therapy: WFL for tasks assessed/performed Overall Cognitive Status: Within Functional Limits for tasks assessed                                        General Comments General comments (skin integrity, edema, etc.): We  discussed therapeutic use of Ice and Heat for L shoulder    Exercises     Assessment/Plan    PT Assessment All further PT needs can be met in the next venue of care  PT Problem List Decreased strength;Decreased activity tolerance;Pain       PT Treatment Interventions      PT Goals (Current goals can be found in the Care Plan section)  Acute Rehab PT Goals Patient Stated Goal: hopes to get home soon PT Goal Formulation: All assessment and education complete, DC therapy    Frequency     Barriers to discharge        Co-evaluation               AM-PAC PT "6 Clicks" Daily Activity  Outcome Measure Difficulty turning over in bed (including adjusting bedclothes, sheets  and blankets)?: None Difficulty moving from lying on back to sitting on the side of the bed? : None Difficulty sitting down on and standing up from a chair with arms (e.g., wheelchair, bedside commode, etc,.)?: None Help needed moving to and from a bed to chair (including a wheelchair)?: None Help needed walking in hospital room?: None Help needed climbing 3-5 steps with a railing? : None 6 Click Score: 24    End of Session   Activity Tolerance: Patient tolerated treatment well Patient left: in chair;with call bell/phone within reach;with family/visitor present Nurse Communication: Mobility status PT Visit Diagnosis: Pain Pain - Right/Left: Left Pain - part of body: Shoulder    Time: 1345-1410 PT Time Calculation (min) (ACUTE ONLY): 25 min   Charges:   PT Evaluation $PT Eval Low Complexity: 1 Procedure PT Treatments $Gait Training: 8-22 mins   PT G Codes:   PT G-Codes **NOT FOR INPATIENT CLASS** Functional Assessment Tool Used: Clinical judgement Functional Limitation: Mobility: Walking and moving around Mobility: Walking and Moving Around Current Status (C7639): 0 percent impaired, limited or restricted Mobility: Walking and Moving Around Goal Status (E3200): 0 percent impaired, limited or restricted Mobility: Walking and Moving Around Discharge Status (V7944): 0 percent impaired, limited or restricted    Roney Marion, PT  Acute Rehabilitation Services Pager [phone] Office [phone]   [place] 09/02/2016, 2:24 PM

## 2016-09-02 NOTE — Progress Notes (Signed)
  Echocardiogram 2D Echocardiogram has been performed.  Whitney PartridgeBrooke Davidson Whitney Davidson 09/02/2016, 12:27 PM

## 2016-09-02 NOTE — Progress Notes (Signed)
Had spoken with pt and nephew in regards to establishing a primary care physician and they have indicated they will get her nephew's PCP to see the pt going forward as her new PCP.

## 2016-09-02 NOTE — Discharge Summary (Addendum)
Physician Discharge Summary   Patient ID: Matilde SprangRuby Faul MRN: [id number] DOB/AGE: 81/27/1932 81 y.o.  Admit date: 09/01/2016 Discharge date: 09/02/2016  Primary Care Physician:  Patient, No Pcp Per  Discharge Diagnoses:    . Essential hypertension . Hyperlipidemia . Syncope Likely vasovagal . LBBB (left bundle branch block) . AKI (acute kidney injury) (HCC)   Consults:  None  Recommendations for Outpatient Follow-up:  1. Please repeat CBC/BMET at next visit 2. Patient recommended to follow up outpatient with orthopedics regarding her chronic left shoulder pain   DIET: Heart healthy diet    Allergies:   Allergies  Allergen Reactions  . Lisinopril-Hydrochlorothiazide Swelling    Lips became swollen  . Penicillins     Has patient had a PCN reaction causing immediate rash, facial/tongue/throat swelling, SOB or lightheadedness with hypotension: Yes Has patient had a PCN reaction causing severe rash involving mucus membranes or skin necrosis: Unknown Has patient had a PCN reaction that required hospitalization: Unknown Has patient had a PCN reaction occurring within the last 10 years: No If all of the above answers are "NO", then may proceed with Cephalosporin use.      DISCHARGE MEDICATIONS: Current Discharge Medication List    START taking these medications   Details  acetaminophen (TYLENOL) 325 MG tablet Take 2 tablets (650 mg total) by mouth every 6 (six) hours as needed for mild pain or moderate pain. OTC Qty: 30 tablet, Refills: 0    UNABLE TO FIND Outpatient Physical Therapy for Left shoulder   Diagnosis: possible rotator cuff arthropathy Qty: 1 Mutually Defined, Refills: 0      CONTINUE these medications which have CHANGED   Details  bisoprolol (ZEBETA) 5 MG tablet Take 1 tablet (5 mg total) by mouth daily. Please HOLD and follow your BP daily for a week      CONTINUE these medications which have NOT CHANGED   Details  amLODipine (NORVASC) 5 MG tablet  Take 5 mg by mouth daily.    aspirin EC 81 MG tablet Take 81 mg by mouth daily.    Cholecalciferol (D3-1000) 1000 units capsule Take 1,000 Units by mouth daily.    Omega-3 Fatty Acids (FISH OIL) 1000 MG CAPS Take 1,000 mg by mouth 2 (two) times daily.    omeprazole (PRILOSEC) 40 MG capsule Take 40 mg by mouth daily before breakfast.    pravastatin (PRAVACHOL) 20 MG tablet Take 20 mg by mouth daily.         Brief H and P: For complete details please refer to admission H and P, but in briefPatient is a 83 year old female with history of hypertension, hyperlipidemia, GERD presented from home today with the syncopal episode. 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 Her niece's husband went to get his wife who lives next door. When she arrived, patient had another episode of dizziness and subsequently slumped in the chair for about 10 seconds. Patient denied any chest pain, palpitations, shortness of breath or any diaphoresis. She reported nausea and one episode of vomiting. Patient's family did not notice any seizure activity or loss of bowel or bladder. Patient was alert and oriented after she gained consciousness. Patient denies any prior episodes of syncopal episodes. Per family, she did not have any breakfast yet this morning. Also patient has just relocated 3 weeks ago from [place], she has  not established any PCP yet. Patient also has been complaining of left shoulder and arm pain off and on for last 1 month. Per patient's family, she was also complaining of the pain on the day of admission but none during the examination.   Hospital Course:   Syncope: Likely vasovagal, however possibly due to dehydration as patient also has acute kidney injury, rule out cardiac  - Patient was admitted, no arrhythmias on telemetry. Serial cardiac  enzymes were negative, d-dimer negative -  EKG showed rate 62 with prolonged PR interval and LBBB, no prior EKG to compare with, troponins remained negative. Beta blocker was held. - 2-D echo showed EF of 50-55% with normal wall motion, no regional wall motion abnormalities, grade 1 diastolic dysfunction     Essential hypertension - BP currently stable, for now continue amlodipine, hold beta blocker due to bradycardia. I strongly recommend the patient to check her BP daily. If her BP starts to rise, she may restart bisoprolol. However I strongly recommended to follow-up and establish with PCP here in [place].    Hyperlipidemia -Continue statin, lipid panel showed LDL 77      LBBB (left bundle branch block) - No old EKG to compare with, 2-D echo showed EF of 55%, no regional wall motion abnormalities    AKI (acute kidney injury) (HCC) - Creatinine 1.4 at the time of admission, no prior creatinine function to compare with - Patient was placed on gentle hydration, creatinine improved 1.1 at the time of discharge.   Left shoulder pain chronic Left shoulder x-ray was obtained, showed no fracture or dislocation however showed subtle calcific changes over the left humeral head suggesting chronic calcific tendinopathy of the rotator cuff Strongly recommended to follow up with PCP and a referral for outpatient orthopedics, may need further workup with MRI to rule out rotator cuff tear. Currently no pain, she can take as needed Tylenol - PT to see patient prior to discharge  Day of Discharge BP (!) 139/44 (BP Location: Right Arm)   Pulse 61   Temp 97.9 F (36.6 C) (Oral)   Resp 18   Ht 5\' 2"  (1.575 m)   Wt 66.8 kg (147 lb 3.2 oz)   SpO2 99%   BMI 26.92 kg/m   Physical Exam: General: Alert and awake oriented x3 not in any acute distress. HEENT: anicteric sclera, pupils reactive to light and accommodation CVS: S1-S2 clear no murmur rubs or gallops Chest: clear to auscultation  bilaterally, no wheezing rales or rhonchi Abdomen: soft nontender, nondistended, normal bowel sounds Extremities: no cyanosis, clubbing or edema noted bilaterally Neuro: Cranial nerves II-XII intact, no focal neurological deficits   The results of significant diagnostics from this hospitalization (including imaging, microbiology, ancillary and laboratory) are listed below for reference.    LAB RESULTS: Basic Metabolic Panel:  Recent Labs Lab 09/01/16 1330 09/01/16 1755 09/02/16 0557  NA 139  --  140  K 4.2  --  3.9  CL 105  --  109  CO2 25  --  24  GLUCOSE 99  --  97  BUN 18  --  14  CREATININE 1.40* 1.21* 1.18*  CALCIUM 9.0  --  8.5*   Liver Function Tests: No results for input(s): AST, ALT, ALKPHOS, BILITOT, PROT, ALBUMIN in the last 168 hours. No results for input(s): LIPASE, AMYLASE in the last 168 hours. No results for input(s): AMMONIA in the last 168 hours. CBC:  Recent Labs Lab 09/01/16 1330 09/01/16 1755  WBC 7.4  9.1  HGB 12.1 11.8*  HCT 36.6 35.5*  MCV 81.7 80.9  PLT 284 302   Cardiac Enzymes:  Recent Labs Lab 09/01/16 2334 09/02/16 0537  TROPONINI <0.03 <0.03   BNP: Invalid input(s): POCBNP CBG:  Recent Labs Lab 09/02/16 0506  GLUCAP 83    Significant Diagnostic Studies:  Dg Chest 2 View  Result Date: 09/01/2016 CLINICAL DATA:  Syncope.  Left arm pain. EXAM: CHEST  2 VIEW COMPARISON:  None. FINDINGS: The heart size and mediastinal contours are within normal limits. Both lungs are clear. The visualized skeletal structures are unremarkable. IMPRESSION: No active cardiopulmonary disease. Electronically Signed   By: Gerome Sam III M.D   On: 09/01/2016 14:09   Dg Shoulder Left  Result Date: 09/01/2016 CLINICAL DATA:  Left shoulder pain for 3 weeks, no known injury. EXAM: LEFT SHOULDER - 2+ VIEW COMPARISON:  None. FINDINGS: Osseous alignment is normal. No fracture seen. Mild degenerative spurring noted along the inferior margin of the  glenoid. Subtle calcific density overlies the left humeral head, likely related to chronic calcific tendinopathy of the rotator cuff. IMPRESSION: 1. No acute findings.  No osseous fracture or dislocation. 2. Subtle calcific changes overlying the left humeral head, suggesting chronic calcific tendinopathy of the rotator cuff. Probable additional degenerative spurring along the inferior margin of the glenoid labrum. Electronically Signed   By: Bary Richard M.D.   On: 09/01/2016 18:55    2D ECHO: Study Conclusions  - Left ventricle: The cavity size was normal. There was mild   concentric hypertrophy. Systolic function was normal. The   estimated ejection fraction was in the range of 50% to 55%. Wall   motion was normal; there were no regional wall motion   abnormalities. Doppler parameters are consistent with abnormal   left ventricular relaxation (grade 1 diastolic dysfunction).   There was no evidence of elevated ventricular filling pressure by   Doppler parameters. - Aortic valve: Trileaflet; normal thickness leaflets. There was   trivial regurgitation. - Aortic root: The aortic root was normal in size. - Mitral valve: Structurally normal valve. There was no   regurgitation. - Left atrium: The atrium was normal in size. - Right ventricle: Systolic function was normal. - Right atrium: The atrium was normal in size. - Tricuspid valve: There was mild regurgitation. - Pulmonary arteries: Systolic pressure was mildly increased. PA   peak pressure: 35 mm Hg (S). - Inferior vena cava: The vessel was normal in size. - Pericardium, extracardiac: There was no pericardial effusion.   Disposition and Follow-up: Discharge Instructions    Diet - low sodium heart healthy    Complete by:  As directed    Discharge instructions    Complete by:  As directed    Please check your BP daily. If your BP is above 140's /90's, you can restart zabeta (your second BP medication).   Please see Dr Clarene Duke  and discuss about your left shoulder. You will need to see an orthopedic doctor.   Increase activity slowly    Complete by:  As directed        DISPOSITION: home    DISCHARGE FOLLOW-UP Follow-up Information    Little, Caryn Bee, MD. Schedule an appointment as soon as possible for a visit in 2 week(s).   Specialty:  Family Medicine Contact information: [address] [place] [address] [phone]            Time spent on Discharge:   Signed:   Ripudeep Rai  M.D. [facility] 09/02/2016, 3:14 PM Pager: [phone]

## 2016-10-03 ENCOUNTER — Encounter (INDEPENDENT_AMBULATORY_CARE_PROVIDER_SITE_OTHER): Payer: Medicare Other | Admitting: Ophthalmology

## 2016-10-03 DIAGNOSIS — H35033 Hypertensive retinopathy, bilateral: Secondary | ICD-10-CM

## 2016-10-03 DIAGNOSIS — H43813 Vitreous degeneration, bilateral: Secondary | ICD-10-CM

## 2016-10-03 DIAGNOSIS — M67912 Unspecified disorder of synovium and tendon, left shoulder: Secondary | ICD-10-CM | POA: Diagnosis not present

## 2016-10-03 DIAGNOSIS — H353231 Exudative age-related macular degeneration, bilateral, with active choroidal neovascularization: Secondary | ICD-10-CM

## 2016-10-03 DIAGNOSIS — I10 Essential (primary) hypertension: Secondary | ICD-10-CM | POA: Diagnosis not present

## 2016-10-08 DIAGNOSIS — M25512 Pain in left shoulder: Secondary | ICD-10-CM | POA: Diagnosis not present

## 2016-10-08 DIAGNOSIS — M7542 Impingement syndrome of left shoulder: Secondary | ICD-10-CM | POA: Diagnosis not present

## 2016-10-12 ENCOUNTER — Telehealth: Payer: Self-pay

## 2016-10-12 NOTE — Telephone Encounter (Signed)
Unable to conduct prev-isit call.  No phone number on file for patient.

## 2016-10-15 ENCOUNTER — Ambulatory Visit (INDEPENDENT_AMBULATORY_CARE_PROVIDER_SITE_OTHER): Payer: Medicare Other | Admitting: Family Medicine

## 2016-10-15 ENCOUNTER — Telehealth: Payer: Self-pay

## 2016-10-15 ENCOUNTER — Encounter: Payer: Self-pay | Admitting: Family Medicine

## 2016-10-15 VITALS — BP 156/78 | HR 90 | Temp 98.1°F | Ht 62.0 in | Wt 154.6 lb

## 2016-10-15 DIAGNOSIS — R8299 Other abnormal findings in urine: Secondary | ICD-10-CM | POA: Diagnosis not present

## 2016-10-15 DIAGNOSIS — N179 Acute kidney failure, unspecified: Secondary | ICD-10-CM | POA: Diagnosis not present

## 2016-10-15 DIAGNOSIS — R55 Syncope and collapse: Secondary | ICD-10-CM

## 2016-10-15 DIAGNOSIS — I10 Essential (primary) hypertension: Secondary | ICD-10-CM

## 2016-10-15 DIAGNOSIS — H353130 Nonexudative age-related macular degeneration, bilateral, stage unspecified: Secondary | ICD-10-CM | POA: Diagnosis not present

## 2016-10-15 DIAGNOSIS — D649 Anemia, unspecified: Secondary | ICD-10-CM

## 2016-10-15 DIAGNOSIS — N183 Chronic kidney disease, stage 3 unspecified: Secondary | ICD-10-CM

## 2016-10-15 DIAGNOSIS — K219 Gastro-esophageal reflux disease without esophagitis: Secondary | ICD-10-CM | POA: Diagnosis not present

## 2016-10-15 DIAGNOSIS — E559 Vitamin D deficiency, unspecified: Secondary | ICD-10-CM | POA: Diagnosis not present

## 2016-10-15 DIAGNOSIS — E785 Hyperlipidemia, unspecified: Secondary | ICD-10-CM | POA: Diagnosis not present

## 2016-10-15 DIAGNOSIS — H3530 Unspecified macular degeneration: Secondary | ICD-10-CM

## 2016-10-15 DIAGNOSIS — R82998 Other abnormal findings in urine: Secondary | ICD-10-CM | POA: Insufficient documentation

## 2016-10-15 HISTORY — DX: Vitamin D deficiency, unspecified: E55.9

## 2016-10-15 HISTORY — DX: Anemia, unspecified: D64.9

## 2016-10-15 HISTORY — DX: Unspecified macular degeneration: H35.30

## 2016-10-15 HISTORY — DX: Gastro-esophageal reflux disease without esophagitis: K21.9

## 2016-10-15 LAB — URINALYSIS, ROUTINE W REFLEX MICROSCOPIC
Bilirubin Urine: NEGATIVE
Hgb urine dipstick: NEGATIVE
Ketones, ur: NEGATIVE
Nitrite: NEGATIVE
RBC / HPF: NONE SEEN (ref 0–?)
Specific Gravity, Urine: 1.01 (ref 1.000–1.030)
Total Protein, Urine: NEGATIVE
Urine Glucose: NEGATIVE
Urobilinogen, UA: 0.2 (ref 0.0–1.0)
pH: 6 (ref 5.0–8.0)

## 2016-10-15 LAB — CBC WITH DIFFERENTIAL/PLATELET
Basophils Absolute: 0 10*3/uL (ref 0.0–0.1)
Basophils Relative: 0.3 % (ref 0.0–3.0)
Eosinophils Absolute: 0.1 10*3/uL (ref 0.0–0.7)
Eosinophils Relative: 1.8 % (ref 0.0–5.0)
HCT: 39.7 % (ref 36.0–46.0)
Hemoglobin: 13 g/dL (ref 12.0–15.0)
Lymphocytes Relative: 50.2 % — ABNORMAL HIGH (ref 12.0–46.0)
Lymphs Abs: 3.8 10*3/uL (ref 0.7–4.0)
MCHC: 32.7 g/dL (ref 30.0–36.0)
MCV: 83.6 fl (ref 78.0–100.0)
Monocytes Absolute: 0.4 10*3/uL (ref 0.1–1.0)
Monocytes Relative: 5.7 % (ref 3.0–12.0)
Neutro Abs: 3.2 10*3/uL (ref 1.4–7.7)
Neutrophils Relative %: 42 % — ABNORMAL LOW (ref 43.0–77.0)
Platelets: 322 10*3/uL (ref 150.0–400.0)
RBC: 4.75 Mil/uL (ref 3.87–5.11)
RDW: 14.5 % (ref 11.5–15.5)
WBC: 7.6 10*3/uL (ref 4.0–10.5)

## 2016-10-15 LAB — COMPREHENSIVE METABOLIC PANEL
ALT: 13 U/L (ref 0–35)
AST: 13 U/L (ref 0–37)
Albumin: 4 g/dL (ref 3.5–5.2)
Alkaline Phosphatase: 88 U/L (ref 39–117)
BUN: 20 mg/dL (ref 6–23)
CO2: 29 mEq/L (ref 19–32)
Calcium: 9.7 mg/dL (ref 8.4–10.5)
Chloride: 101 mEq/L (ref 96–112)
Creatinine, Ser: 1.34 mg/dL — ABNORMAL HIGH (ref 0.40–1.20)
GFR: 48.43 mL/min — ABNORMAL LOW (ref >60.00)
Glucose, Bld: 106 mg/dL — ABNORMAL HIGH (ref 70–99)
Potassium: 4.6 mEq/L (ref 3.5–5.1)
Sodium: 138 mEq/L (ref 135–145)
Total Bilirubin: 0.3 mg/dL (ref 0.2–1.2)
Total Protein: 7.3 g/dL (ref 6.0–8.3)

## 2016-10-15 MED ORDER — OMEPRAZOLE 40 MG PO CPDR: 40.0000 mg | DELAYED_RELEASE_CAPSULE | Freq: Every day | ORAL | 3 refills | Status: DC

## 2016-10-15 NOTE — Telephone Encounter (Signed)
ROI faxed to Dr. Ahmad Ibrahim in [place].  [phone].

## 2016-10-15 NOTE — Progress Notes (Addendum)
Mora BellmanRuby Etheleen MayhewLeach is a 83 y.o. [facility]   Patient Care Team: Helane RimaWallace, Sheela Mcculley, DO as PCP - General (Family Medicine)   History of Present Illness:  Britt BottomJamie Wheeley CMA acting as scribe for Dr. Earlene PlaterWallace.  HPI:  Patient comes in today to establish care. She has moved from [place] here to be with family. She was recently in the ER for syncope thought to be secondary to dehydration. Visit, labs, and studies were reviewed with the patient. Chronic and new issues reviewed below.   Health Maintenance Due  Topic Date Due  . TETANUS/TDAP  07/03/1949  . DEXA SCAN  07/02/1997  . PNA vac Low Risk Adult (1 of 2 - PCV13) 07/02/1997   PMHx, SurgHx, SocialHx, Medications, and Allergies were reviewed in the Visit Navigator and updated as appropriate.   Past Medical History:  Diagnosis Date  . Anemia 10/15/2016  . CKD (chronic kidney disease) stage 3, GFR 30-59 ml/min 09/01/2016  . GERD (gastroesophageal reflux disease) 10/15/2016  . High cholesterol   . Hypertension   . LBBB (left bundle branch block) 09/01/2016  . Macular degeneration 10/15/2016  . Syncope 09/01/2016   Due to dehydration. See ER visit on 09/01/2016.  . Vitamin D deficiency 10/15/2016   No past surgical history on file. No family history on file..  Social History  Substance Use Topics  . Smoking status: Never Smoker  . Smokeless tobacco: Never Used  . Alcohol use No   Current Medications and Allergies:   .  acetaminophen (TYLENOL) 325 MG tablet, Take 2 tablets (650 mg total) by mouth every 6 (six) hours as needed for mild pain or moderate pain. OTC, Disp: 30 tablet, Rfl: 0 .  amLODipine (NORVASC) 5 MG tablet, Take 5 mg by mouth daily., Disp: , Rfl:  .  aspirin EC 81 MG tablet, Take 81 mg by mouth daily., Disp: , Rfl:  .  Cholecalciferol (D3-1000) 1000 units capsule, Take 1,000 Units by mouth daily., Disp: , Rfl:  .  Omega-3 Fatty Acids (FISH OIL) 1000 MG CAPS, Take 1,000 mg by mouth 2 (two) times  daily., Disp: , Rfl:  .  omeprazole (PRILOSEC) 40 MG capsule, Take 1 capsule (40 mg total) by mouth daily before breakfast., Disp: 90 capsule, Rfl: 3 .  pravastatin (PRAVACHOL) 20 MG tablet, Take 20 mg by mouth daily., Disp: , Rfl:  .  trimethoprim-polymyxin b (POLYTRIM) ophthalmic solution, INT 1 GTT INTO OS QID 2 DAYS AFTER EACH MONTHLY INJECTION, Disp: , Rfl: 12  Allergies  Allergen Reactions  . Lisinopril-Hydrochlorothiazide Swelling    Angioedema  . Penicillins     Has patient had a PCN reaction causing immediate rash, facial/tongue/throat swelling, SOB or lightheadedness with hypotension: Yes Has patient had a PCN reaction causing severe rash involving mucus membranes or skin necrosis: Unknown Has patient had a PCN reaction that required hospitalization: Unknown Has patient had a PCN reaction occurring within the last 10 years: No If all of the above answers are "NO", then may proceed with Cephalosporin use.   Review of Systems:   Review of Systems  Constitutional: Negative for chills, fever, malaise/fatigue and weight loss.  Respiratory: Negative for cough, shortness of breath and wheezing.   Cardiovascular: Negative for chest pain, palpitations and leg swelling.  Gastrointestinal: Negative for abdominal pain, constipation, diarrhea, nausea and vomiting.  Genitourinary: Negative for dysuria and urgency.  Musculoskeletal: Negative for joint pain and myalgias.  Skin: Negative for rash.  Neurological: Negative for  dizziness and headaches.  Psychiatric/Behavioral: Negative for depression, substance abuse and suicidal ideas. The patient is not nervous/anxious.    Vitals:   Vitals:   10/15/16 0754  BP: (!) 156/78  Pulse: 90  Temp: 98.1 F (36.7 C)  TempSrc: Oral  SpO2: 97%  Weight: 154 lb 9.6 oz (70.1 kg)  Height: 5\' 2"  (1.575 m)     Body mass index is 28.28 kg/m.  Physical Exam:   Physical Exam  Constitutional: She appears well-nourished.  HENT:  Head:  Normocephalic and atraumatic.  Eyes: EOM are normal. Pupils are equal, round, and reactive to light.  Neck: Normal range of motion. Neck supple.  Cardiovascular: Normal rate, regular rhythm, normal heart sounds and intact distal pulses.   Pulmonary/Chest: Effort normal.  Abdominal: Soft.  Skin: Skin is warm.  Psychiatric: She has a normal mood and affect. Her behavior is normal.  Nursing note and vitals reviewed.  Results for orders placed or performed in visit on 10/15/16  CBC with Differential/Platelet  Result Value Ref Range   WBC 7.6 4.0 - 10.5 K/uL   RBC 4.75 3.87 - 5.11 Mil/uL   Hemoglobin 13.0 12.0 - 15.0 g/dL   HCT 16.1 09.6 - 04.5 %   MCV 83.6 78.0 - 100.0 fl   MCHC 32.7 30.0 - 36.0 g/dL   RDW 40.9 81.1 - 91.4 %   Platelets 322.0 150.0 - 400.0 K/uL   Neutrophils Relative % 42.0 (L) 43.0 - 77.0 %   Lymphocytes Relative 50.2 (H) 12.0 - 46.0 %   Monocytes Relative 5.7 3.0 - 12.0 %   Eosinophils Relative 1.8 0.0 - 5.0 %   Basophils Relative 0.3 0.0 - 3.0 %   Neutro Abs 3.2 1.4 - 7.7 K/uL   Lymphs Abs 3.8 0.7 - 4.0 K/uL   Monocytes Absolute 0.4 0.1 - 1.0 K/uL   Eosinophils Absolute 0.1 0.0 - 0.7 K/uL   Basophils Absolute 0.0 0.0 - 0.1 K/uL  Comprehensive metabolic panel  Result Value Ref Range   Sodium 138 135 - 145 mEq/L   Potassium 4.6 3.5 - 5.1 mEq/L   Chloride 101 96 - 112 mEq/L   CO2 29 19 - 32 mEq/L   Glucose, Bld 106 (H) 70 - 99 mg/dL   BUN 20 6 - 23 mg/dL   Creatinine, Ser 7.82 (H) 0.40 - 1.20 mg/dL   Total Bilirubin 0.3 0.2 - 1.2 mg/dL   Alkaline Phosphatase 88 39 - 117 U/L   AST 13 0 - 37 U/L   ALT 13 0 - 35 U/L   Total Protein 7.3 6.0 - 8.3 g/dL   Albumin 4.0 3.5 - 5.2 g/dL   Calcium 9.7 8.4 - 95.6 mg/dL   GFR 21.30 (L) >86.57 mL/min  Urinalysis, Routine w reflex microscopic  Result Value Ref Range   Color, Urine YELLOW Yellow;Lt. Yellow   APPearance CLEAR Clear   Specific Gravity, Urine 1.010 1.000 - 1.030   pH 6.0 5.0 - 8.0   Total Protein, Urine  NEGATIVE Negative   Urine Glucose NEGATIVE Negative   Ketones, ur NEGATIVE Negative   Bilirubin Urine NEGATIVE Negative   Hgb urine dipstick NEGATIVE Negative   Urobilinogen, UA 0.2 0.0 - 1.0   Leukocytes, UA TRACE (A) Negative   Nitrite NEGATIVE Negative   WBC, UA 3-6/hpf (A) 0-2/hpf   RBC / HPF none seen 0-2/hpf   Squamous Epithelial / LPF Rare(0-4/hpf) Rare(0-4/hpf)    Assessment and Plan:   Diagnoses and all orders for this visit:  Problem  Gerd (Gastroesophageal Reflux Disease)   Medication: Omeprazole.   Macular Degeneration   Followed by [facility].   Vitamin D Deficiency   Taking vitamin D daily.   Leukocytes in Urine   Urine culture ordered. Genito-Urinary ROS: no dysuria, trouble voiding, or hematuria.   Essential Hypertension   Current medication: Norvasc, ASA. Home blood pressure readings not monitored.  Avoiding excessive salt intake? [x]   YES  []   NO Trying to exercise on a regular basis? []   YES  [x]   NO Review: taking medications as instructed, no medication side effects noted, no TIAs, no chest pain on exertion, no dyspnea on exertion, no swelling of ankles.   Wt Readings from Last 3 Encounters:  10/15/16 154 lb 9.6 oz (70.1 kg)  09/02/16 147 lb 3.2 oz (66.8 kg)   Reports that she has never smoked. She has never used smokeless tobacco.  BP Readings from Last 3 Encounters:  10/15/16 (!) 156/78  09/02/16 (!) 139/44   Lab Results  Component Value Date   CREATININE 1.34 (H) 10/15/2016     Hyperlipidemia   Medication: Pravastatin. Is the patient taking medications without problems? [x]   YES  []   NO Does the patient complain of muscle aches?   []   YES  [x]    NO Trying to exercise on a regular basis? []   YES  [x]   NO Diet Compliance: compliant most of the time.   Lipids:    Component Value Date/Time   CHOL 138 09/02/2016 0848   TRIG 86 09/02/2016 0848   HDL 44 09/02/2016 0848   VLDL 17 09/02/2016 0848   CHOLHDL 3.1 09/02/2016  0848     Ckd (Chronic Kidney Disease) Stage 3, Gfr 30-59 Ml/Min   Lab Results  Component Value Date   CREATININE 1.34 (H) 10/15/2016   CREATININE 1.18 (H) 09/02/2016   CREATININE 1.21 (H) 09/01/2016   Unknown baseline. Requesting records from previous physician in [place]. Patient not taking any concerning medications or supplements. I recommend hydration. Will refer to Nephrology for an establishing visit.    Anemia (Resolved)   CBC Latest Ref Rng & Units 10/15/2016 09/01/2016 09/01/2016  WBC 4.0 - 10.5 K/uL 7.6 9.1 7.4  Hemoglobin 12.0 - 15.0 g/dL 21.3 11.8(L) 12.1  Hematocrit 36.0 - 46.0 % 39.7 35.5(L) 36.6  Platelets 150.0 - 400.0 K/uL 322.0 302 284     Syncope (Resolved)   Due to dehydration. See ER visit on 09/01/2016.      Marland Kitchen Reviewed expectations re: course of current medical issues. . Discussed self-management of symptoms. . Outlined signs and symptoms indicating need for more acute intervention. . Patient verbalized understanding and all questions were answered. Marland Kitchen Health Maintenance issues including appropriate healthy diet, exercise, and smoking avoidance were discussed with patient. . See orders for this visit as documented in the electronic medical record. . Patient received an After Visit Summary.  CMA served as Neurosurgeon during this visit. History, Physical, and Plan performed by medical provider. The above documentation has been reviewed and is accurate and complete. Helane Rima, D.O.  Records requested if needed. Time spent with the patient: 45 minutes, of which >50% was spent in obtaining information about her symptoms, reviewing her previous labs, evaluations, and treatments, counseling her about her condition (please see the discussed topics above), and developing a plan to further investigate it; she had a number of questions which I addressed.   Helane Rima, DO [facility] [place] [place] 10/15/2016  Future Appointments Date  Time Provider Department Center    12/26/2016 7:30 AM Sherrie George, MD [facility] None  01/16/2017 7:30 AM Helane Rima, DO [facility] None

## 2016-10-16 DIAGNOSIS — M7542 Impingement syndrome of left shoulder: Secondary | ICD-10-CM | POA: Diagnosis not present

## 2016-10-16 DIAGNOSIS — M25512 Pain in left shoulder: Secondary | ICD-10-CM | POA: Diagnosis not present

## 2016-10-17 LAB — URINE CULTURE

## 2016-10-18 DIAGNOSIS — M25512 Pain in left shoulder: Secondary | ICD-10-CM | POA: Diagnosis not present

## 2016-10-18 DIAGNOSIS — M7542 Impingement syndrome of left shoulder: Secondary | ICD-10-CM | POA: Diagnosis not present

## 2016-10-22 DIAGNOSIS — M25512 Pain in left shoulder: Secondary | ICD-10-CM | POA: Diagnosis not present

## 2016-10-22 DIAGNOSIS — M7542 Impingement syndrome of left shoulder: Secondary | ICD-10-CM | POA: Diagnosis not present

## 2016-10-25 DIAGNOSIS — Z1231 Encounter for screening mammogram for malignant neoplasm of breast: Secondary | ICD-10-CM | POA: Diagnosis not present

## 2016-10-25 LAB — HM MAMMOGRAPHY

## 2016-10-29 DIAGNOSIS — M7542 Impingement syndrome of left shoulder: Secondary | ICD-10-CM | POA: Diagnosis not present

## 2016-10-29 DIAGNOSIS — M25512 Pain in left shoulder: Secondary | ICD-10-CM | POA: Diagnosis not present

## 2016-10-31 DIAGNOSIS — M25512 Pain in left shoulder: Secondary | ICD-10-CM | POA: Diagnosis not present

## 2016-10-31 DIAGNOSIS — M7542 Impingement syndrome of left shoulder: Secondary | ICD-10-CM | POA: Diagnosis not present

## 2016-11-05 ENCOUNTER — Encounter: Payer: Self-pay | Admitting: Family Medicine

## 2016-11-07 DIAGNOSIS — M7542 Impingement syndrome of left shoulder: Secondary | ICD-10-CM | POA: Diagnosis not present

## 2016-11-07 DIAGNOSIS — M25512 Pain in left shoulder: Secondary | ICD-10-CM | POA: Diagnosis not present

## 2016-11-12 DIAGNOSIS — M7542 Impingement syndrome of left shoulder: Secondary | ICD-10-CM | POA: Diagnosis not present

## 2016-11-12 DIAGNOSIS — M25512 Pain in left shoulder: Secondary | ICD-10-CM | POA: Diagnosis not present

## 2016-11-14 DIAGNOSIS — M25512 Pain in left shoulder: Secondary | ICD-10-CM | POA: Diagnosis not present

## 2016-11-14 DIAGNOSIS — M7542 Impingement syndrome of left shoulder: Secondary | ICD-10-CM | POA: Diagnosis not present

## 2016-11-19 DIAGNOSIS — M25512 Pain in left shoulder: Secondary | ICD-10-CM | POA: Diagnosis not present

## 2016-11-19 DIAGNOSIS — M7542 Impingement syndrome of left shoulder: Secondary | ICD-10-CM | POA: Diagnosis not present

## 2016-11-21 DIAGNOSIS — M7542 Impingement syndrome of left shoulder: Secondary | ICD-10-CM | POA: Diagnosis not present

## 2016-11-21 DIAGNOSIS — M25512 Pain in left shoulder: Secondary | ICD-10-CM | POA: Diagnosis not present

## 2016-12-05 DIAGNOSIS — N390 Urinary tract infection, site not specified: Secondary | ICD-10-CM | POA: Diagnosis not present

## 2016-12-05 DIAGNOSIS — I129 Hypertensive chronic kidney disease with stage 1 through stage 4 chronic kidney disease, or unspecified chronic kidney disease: Secondary | ICD-10-CM | POA: Diagnosis not present

## 2016-12-05 DIAGNOSIS — Z6827 Body mass index (BMI) 27.0-27.9, adult: Secondary | ICD-10-CM | POA: Diagnosis not present

## 2016-12-05 DIAGNOSIS — N183 Chronic kidney disease, stage 3 (moderate): Secondary | ICD-10-CM | POA: Diagnosis not present

## 2016-12-11 ENCOUNTER — Telehealth: Payer: Self-pay | Admitting: Family Medicine

## 2016-12-11 NOTE — Telephone Encounter (Signed)
MEDICATION: amLODipine (NORVASC) 5 MG tablet pravastatin (PRAVACHOL) 20 MG tablet  PHARMACY: [facility] [id number] - [place], [place] - [address]    IS THIS A 90 DAY SUPPLY : yes for both  IS PATIENT OUT OF MEDICATION: yes for both  IF NOT; HOW MUCH IS LEFT: n/a  LAST APPOINTMENT DATE:10/15/16  NEXT APPOINTMENT DATE:01/16/17  OTHER COMMENTS:    **Let patient know to contact pharmacy at the end of the day to make sure medication is ready. **  ** Please notify patient to allow 48-72 hours to process**  **Encourage patient to contact the pharmacy for refills or they can [facility]**

## 2016-12-12 MED ORDER — AMLODIPINE BESYLATE 5 MG PO TABS: 5.0000 mg | ORAL_TABLET | Freq: Every day | ORAL | 1 refills | Status: DC

## 2016-12-12 MED ORDER — PRAVASTATIN SODIUM 20 MG PO TABS: 20.0000 mg | ORAL_TABLET | Freq: Every day | ORAL | 1 refills | Status: DC

## 2016-12-12 NOTE — Telephone Encounter (Signed)
Medication sent in for patient. 

## 2016-12-26 ENCOUNTER — Encounter (INDEPENDENT_AMBULATORY_CARE_PROVIDER_SITE_OTHER): Payer: Medicare Other | Admitting: Ophthalmology

## 2016-12-26 DIAGNOSIS — H353231 Exudative age-related macular degeneration, bilateral, with active choroidal neovascularization: Secondary | ICD-10-CM | POA: Diagnosis not present

## 2016-12-26 DIAGNOSIS — I10 Essential (primary) hypertension: Secondary | ICD-10-CM

## 2016-12-26 DIAGNOSIS — H35033 Hypertensive retinopathy, bilateral: Secondary | ICD-10-CM

## 2016-12-26 DIAGNOSIS — H43813 Vitreous degeneration, bilateral: Secondary | ICD-10-CM

## 2017-01-08 ENCOUNTER — Telehealth: Payer: Self-pay | Admitting: *Deleted

## 2017-01-08 NOTE — Telephone Encounter (Signed)
Whitney Davidson patient's  niece called and stated that she made an appt with [facility] in [facility] for the patient  .  The appt is scheduled for Oct 22 at 3:30pm The office is needing a referral fax to them . The office direct number is [phone]. If you have any questions you can call [name], Her contact number is [phone].

## 2017-01-09 ENCOUNTER — Other Ambulatory Visit: Payer: Self-pay

## 2017-01-09 DIAGNOSIS — H9193 Unspecified hearing loss, bilateral: Secondary | ICD-10-CM

## 2017-01-09 NOTE — Telephone Encounter (Signed)
Referral has been placed in Epic. 

## 2017-01-16 ENCOUNTER — Encounter: Payer: Self-pay | Admitting: Family Medicine

## 2017-01-16 ENCOUNTER — Ambulatory Visit (INDEPENDENT_AMBULATORY_CARE_PROVIDER_SITE_OTHER): Payer: Medicare Other | Admitting: Family Medicine

## 2017-01-16 VITALS — BP 130/78 | HR 77 | Temp 98.1°F | Ht 62.0 in | Wt 153.4 lb

## 2017-01-16 DIAGNOSIS — N183 Chronic kidney disease, stage 3 unspecified: Secondary | ICD-10-CM

## 2017-01-16 DIAGNOSIS — I10 Essential (primary) hypertension: Secondary | ICD-10-CM

## 2017-01-16 DIAGNOSIS — K219 Gastro-esophageal reflux disease without esophagitis: Secondary | ICD-10-CM

## 2017-01-16 DIAGNOSIS — E785 Hyperlipidemia, unspecified: Secondary | ICD-10-CM | POA: Diagnosis not present

## 2017-01-16 NOTE — Progress Notes (Signed)
Whitney Davidson is a 83 y.o. female is here for follow up.  History of Present Illness:   Britt Bottom CMA acting as scribe for Dr. Earlene Plater.  HPI: Patient comes in today for follow up on her hypertension: Blood pressure looks good today. Patient has no other concerns today.   1. Essential hypertension.   Avoiding excessive salt intake?   YES    NO Trying to exercise on a regular basis?   YES    NO Review: taking medications as instructed, no medication side effects noted, no TIAs, no chest pain on exertion, no dyspnea on exertion, no swelling of ankles.   Wt Readings from Last 3 Encounters:  01/16/17 153 lb 6.4 oz (69.6 kg)  10/15/16 154 lb 9.6 oz (70.1 kg)  09/02/16 147 lb 3.2 oz (66.8 kg)   Reports that she has never smoked. She has never used smokeless tobacco.  BP Readings from Last 3 Encounters:  01/16/17 130/78  10/15/16 (!) 156/78  09/02/16 (!) 139/44   Lab Results  Component Value Date   CREATININE 1.34 (H) 10/15/2016     2. Hyperlipidemia.  Is the patient taking medications without problems?   YES    NO Does the patient complain of muscle aches?     YES     NO  Lipids:    Component Value Date/Time   CHOL 138 09/02/2016 0848   TRIG 86 09/02/2016 0848   HDL 44 09/02/2016 0848   VLDL 17 09/02/2016 0848   CHOLHDL 3.1 09/02/2016 0848     3. Gastroesophageal reflux disease. Doing well and without concerns.     4. CKD (chronic kidney disease) stage 3, GFR 30-59 ml/min (HCC).   Lab Results  Component Value Date   CREATININE 1.34 (H) 10/15/2016   CREATININE 1.18 (H) 09/02/2016   CREATININE 1.21 (H) 09/01/2016     Health Maintenance Due  Topic Date Due  . DEXA SCAN  07/02/1997   Depression screen PHQ 2/9 10/15/2016  Decreased Interest 0  Down, Depressed, Hopeless 0  PHQ - 2 Score 0   PMHx, SurgHx, SocialHx, FamHx, Medications, and Allergies were reviewed in the Visit Navigator and updated as appropriate.   Patient Active Problem List     Diagnosis Date Noted  . GERD (gastroesophageal reflux disease) 10/15/2016  . Macular degeneration 10/15/2016  . Vitamin D deficiency 10/15/2016  . Essential hypertension 09/01/2016  . Hyperlipidemia 09/01/2016  . LBBB (left bundle branch block) 09/01/2016  . CKD (chronic kidney disease) stage 3, GFR 30-59 ml/min (HCC) 09/01/2016   Social History  Substance Use Topics  . Smoking status: Never Smoker  . Smokeless tobacco: Never Used  . Alcohol use No   Current Medications and Allergies:   .  acetaminophen (TYLENOL) 325 MG tablet, Take 2 tablets (650 mg total) by mouth every 6 (six) hours as needed for mild pain or moderate pain. OTC, Disp: 30 tablet, Rfl: 0 .  amLODipine (NORVASC) 5 MG tablet, Take 1 tablet (5 mg total) by mouth daily., Disp: 90 tablet, Rfl: 1 .  aspirin EC 81 MG tablet, Take 81 mg by mouth daily., Disp: , Rfl:  .  Cholecalciferol (D3-1000) 1000 units capsule, Take 1,000 Units by mouth daily., Disp: , Rfl:  .  Omega-3 Fatty Acids (FISH OIL) 1000 MG CAPS, Take 1,000 mg by mouth 2 (two) times daily., Disp: , Rfl:  .  omeprazole (PRILOSEC) 40 MG capsule, Take 1 capsule (40 mg total) by mouth daily before breakfast., Disp: 90  capsule, Rfl: 3 .  pravastatin (PRAVACHOL) 20 MG tablet, Take 1 tablet (20 mg total) by mouth daily., Disp: 90 tablet, Rfl: 1 .  trimethoprim-polymyxin b (POLYTRIM) ophthalmic solution, INT 1 GTT INTO OS QID 2 DAYS AFTER EACH MONTHLY INJECTION, Disp: , Rfl: 12   Allergies  Allergen Reactions  . Lisinopril-Hydrochlorothiazide Swelling    Angioedema  . Penicillins     Has patient had a PCN reaction causing immediate rash, facial/tongue/throat swelling, SOB or lightheadedness with hypotension: Yes Has patient had a PCN reaction causing severe rash involving mucus membranes or skin necrosis: Unknown Has patient had a PCN reaction that required hospitalization: Unknown Has patient had a PCN reaction occurring within the last 10 years: No If all of  the above answers are "NO", then may proceed with Cephalosporin use.   Review of Systems   Pertinent items are noted in the HPI. Otherwise, ROS is negative.  Vitals:   Vitals:   01/16/17 0749  BP: 130/78  Pulse: 77  Temp: 98.1 F (36.7 C)  TempSrc: Oral  SpO2: 96%  Weight: 153 lb 6.4 oz (69.6 kg)  Height:  (1.575 m)     Body mass index is 28.06 kg/m.   Physical Exam:   Physical Exam  Constitutional: She appears well-nourished.  HENT:  Head: Normocephalic and atraumatic.  Eyes: Pupils are equal, round, and reactive to light. EOM are normal.  Neck: Normal range of motion. Neck supple.  Cardiovascular: Normal rate, regular rhythm, normal heart sounds and intact distal pulses.   Pulmonary/Chest: Effort normal.  Abdominal: Soft.  Skin: Skin is warm.  Psychiatric: She has a normal mood and affect. Her behavior is normal.  Nursing note and vitals reviewed.   Assessment and Plan:   Diagnoses and all orders for this visit:  Essential hypertension Comments: Doing well.   Hyperlipidemia, unspecified hyperlipidemia type Comments: Doing well.  Gastroesophageal reflux disease without esophagitis Comments: Doing well.  CKD (chronic kidney disease) stage 3, GFR 30-59 ml/min (HCC) Comments: Recheck at next visit.    . Reviewed expectations re: course of current medical issues. . Discussed self-management of symptoms. . Outlined signs and symptoms indicating need for more acute intervention. . Patient verbalized understanding and all questions were answered. Marland Kitchen Health Maintenance issues including appropriate healthy diet, exercise, and smoking avoidance were discussed with patient. . See orders for this visit as documented in the electronic medical record. . Patient received an After Visit Summary.  CMA served as Neurosurgeon during this visit. History, Physical, and Plan performed by medical provider. The above documentation has been reviewed and is accurate and  complete. Helane Rima, D.O.  Helane Rima, DO [place], [facility] 01/20/2017  Future Appointments Date Time Provider Department Center  03/20/2017 7:30 AM Sherrie George, MD [facility] None  04/18/2017 7:30 AM Helane Rima, DO [facility] None

## 2017-02-04 DIAGNOSIS — H903 Sensorineural hearing loss, bilateral: Secondary | ICD-10-CM | POA: Diagnosis not present

## 2017-03-20 ENCOUNTER — Encounter (INDEPENDENT_AMBULATORY_CARE_PROVIDER_SITE_OTHER): Payer: Medicare Other | Admitting: Ophthalmology

## 2017-04-17 ENCOUNTER — Encounter (INDEPENDENT_AMBULATORY_CARE_PROVIDER_SITE_OTHER): Payer: Medicare Other | Admitting: Ophthalmology

## 2017-04-17 DIAGNOSIS — H35033 Hypertensive retinopathy, bilateral: Secondary | ICD-10-CM | POA: Diagnosis not present

## 2017-04-17 DIAGNOSIS — H43813 Vitreous degeneration, bilateral: Secondary | ICD-10-CM | POA: Diagnosis not present

## 2017-04-17 DIAGNOSIS — I10 Essential (primary) hypertension: Secondary | ICD-10-CM | POA: Diagnosis not present

## 2017-04-17 DIAGNOSIS — H353231 Exudative age-related macular degeneration, bilateral, with active choroidal neovascularization: Secondary | ICD-10-CM | POA: Diagnosis not present

## 2017-04-18 ENCOUNTER — Ambulatory Visit (INDEPENDENT_AMBULATORY_CARE_PROVIDER_SITE_OTHER): Payer: Medicare Other | Admitting: Family Medicine

## 2017-04-18 ENCOUNTER — Encounter: Payer: Self-pay | Admitting: Family Medicine

## 2017-04-18 VITALS — BP 140/74 | HR 76 | Temp 97.9°F | Wt 157.8 lb

## 2017-04-18 DIAGNOSIS — E538 Deficiency of other specified B group vitamins: Secondary | ICD-10-CM

## 2017-04-18 DIAGNOSIS — D649 Anemia, unspecified: Secondary | ICD-10-CM

## 2017-04-18 DIAGNOSIS — N183 Chronic kidney disease, stage 3 unspecified: Secondary | ICD-10-CM

## 2017-04-18 DIAGNOSIS — R252 Cramp and spasm: Secondary | ICD-10-CM | POA: Diagnosis not present

## 2017-04-18 DIAGNOSIS — E785 Hyperlipidemia, unspecified: Secondary | ICD-10-CM | POA: Diagnosis not present

## 2017-04-18 DIAGNOSIS — I10 Essential (primary) hypertension: Secondary | ICD-10-CM | POA: Diagnosis not present

## 2017-04-18 DIAGNOSIS — K219 Gastro-esophageal reflux disease without esophagitis: Secondary | ICD-10-CM | POA: Diagnosis not present

## 2017-04-18 DIAGNOSIS — E2839 Other primary ovarian failure: Secondary | ICD-10-CM | POA: Diagnosis not present

## 2017-04-18 LAB — VITAMIN B12: Vitamin B-12: 357 pg/mL (ref 211–911)

## 2017-04-18 LAB — CBC WITH DIFFERENTIAL/PLATELET
Basophils Absolute: 0 10*3/uL (ref 0.0–0.1)
Basophils Relative: 0.3 % (ref 0.0–3.0)
Eosinophils Absolute: 0.1 10*3/uL (ref 0.0–0.7)
Eosinophils Relative: 1.7 % (ref 0.0–5.0)
HCT: 40.1 % (ref 36.0–46.0)
Hemoglobin: 13 g/dL (ref 12.0–15.0)
Lymphocytes Relative: 55 % — ABNORMAL HIGH (ref 12.0–46.0)
Lymphs Abs: 3.6 10*3/uL (ref 0.7–4.0)
MCHC: 32.4 g/dL (ref 30.0–36.0)
MCV: 85.5 fl (ref 78.0–100.0)
Monocytes Absolute: 0.4 10*3/uL (ref 0.1–1.0)
Monocytes Relative: 5.6 % (ref 3.0–12.0)
Neutro Abs: 2.4 10*3/uL (ref 1.4–7.7)
Neutrophils Relative %: 37.4 % — ABNORMAL LOW (ref 43.0–77.0)
Platelets: 299 10*3/uL (ref 150.0–400.0)
RBC: 4.69 Mil/uL (ref 3.87–5.11)
RDW: 14.2 % (ref 11.5–15.5)
WBC: 6.5 10*3/uL (ref 4.0–10.5)

## 2017-04-18 LAB — TSH: TSH: 2.57 u[IU]/mL (ref 0.35–4.50)

## 2017-04-18 LAB — COMPREHENSIVE METABOLIC PANEL
ALT: 13 U/L (ref 0–35)
AST: 14 U/L (ref 0–37)
Albumin: 4 g/dL (ref 3.5–5.2)
Alkaline Phosphatase: 86 U/L (ref 39–117)
BUN: 14 mg/dL (ref 6–23)
CO2: 26 mEq/L (ref 19–32)
Calcium: 9.1 mg/dL (ref 8.4–10.5)
Chloride: 105 mEq/L (ref 96–112)
Creatinine, Ser: 1.24 mg/dL — ABNORMAL HIGH (ref 0.40–1.20)
GFR: 52.9 mL/min — ABNORMAL LOW (ref >60.00)
Glucose, Bld: 107 mg/dL — ABNORMAL HIGH (ref 70–99)
Potassium: 4 mEq/L (ref 3.5–5.1)
Sodium: 141 mEq/L (ref 135–145)
Total Bilirubin: 0.7 mg/dL (ref 0.2–1.2)
Total Protein: 7.1 g/dL (ref 6.0–8.3)

## 2017-04-18 MED ORDER — OMEPRAZOLE 40 MG PO CPDR: 40.0000 mg | DELAYED_RELEASE_CAPSULE | Freq: Every day | ORAL | 3 refills | Status: DC

## 2017-04-18 NOTE — Progress Notes (Signed)
Whitney Davidson is a 84 y.o. female is here for follow up.  History of Present Illness:   Whitney Davidson, CMA acting as scribe for Dr. Helane RimaErica Sabine Tenenbaum.   HPI:  Hypertension Avoiding excessive salt intake? [x]   YES  []   NO Trying to exercise on a regular basis? [x]   YES  []   NO Review: taking medications as instructed, no medication side effects noted, no TIAs, no chest pain on exertion, no dyspnea on exertion, no swelling of ankles.  Smoker: No.   Patient has had some cramping in right leg every now and then. She has been taking mustard seed with success.   Wt Readings from Last 3 Encounters:  04/18/17 157 lb 12.8 oz (71.6 kg)  01/16/17 153 lb 6.4 oz (69.6 kg)  10/15/16 154 lb 9.6 oz (70.1 kg)   BP Readings from Last 3 Encounters:  04/18/17 140/74  01/16/17 130/78  10/15/16 (!) 156/78   Lab Results  Component Value Date   CREATININE 1.24 (H) 04/18/2017   Health Maintenance Due  Topic Date Due  . DEXA SCAN  07/02/1997   Depression screen PHQ 2/9 10/15/2016  Decreased Interest 0  Down, Depressed, Hopeless 0  PHQ - 2 Score 0   PMHx, SurgHx, SocialHx, FamHx, Medications, and Allergies were reviewed in the Visit Navigator and updated as appropriate.   Patient Active Problem List   Diagnosis Date Noted  . GERD (gastroesophageal reflux disease) 10/15/2016  . Macular degeneration 10/15/2016  . Vitamin D deficiency 10/15/2016  . Essential hypertension 09/01/2016  . Hyperlipidemia 09/01/2016  . LBBB (left bundle branch block) 09/01/2016  . CKD (chronic kidney disease) stage 3, GFR 30-59 ml/min (HCC) 09/01/2016   Social History   Tobacco Use  . Smoking status: Never Smoker  . Smokeless tobacco: Never Used  Substance Use Topics  . Alcohol use: No  . Drug use: No   Current Medications and Allergies:   .  acetaminophen (TYLENOL) 325 MG tablet, Take 2 tablets (650 mg total) by mouth every 6 (six) hours as needed for mild pain or moderate pain. OTC, Disp: 30 tablet, Rfl:  0 .  amLODipine (NORVASC) 5 MG tablet, Take 1 tablet (5 mg total) by mouth daily., Disp: 90 tablet, Rfl: 1 .  aspirin EC 81 MG tablet, Take 81 mg by mouth daily., Disp: , Rfl:  .  Cholecalciferol (D3-1000) 1000 units capsule, Take 1,000 Units by mouth daily., Disp: , Rfl:  .  Omega-3 Fatty Acids (FISH OIL) 1000 MG CAPS, Take 1,000 mg by mouth 2 (two) times daily., Disp: , Rfl:  .  omeprazole (PRILOSEC) 40 MG capsule, Take 1 capsule (40 mg total) by mouth daily before breakfast., Disp: 90 capsule, Rfl: 3 .  pravastatin (PRAVACHOL) 20 MG tablet, Take 1 tablet (20 mg total) by mouth daily., Disp: 90 tablet, Rfl: 1 .  trimethoprim-polymyxin b (POLYTRIM) ophthalmic solution, INT 1 GTT INTO OS QID 2 DAYS AFTER EACH MONTHLY INJECTION, Disp: , Rfl: 12   Allergies  Allergen Reactions  . Lisinopril-Hydrochlorothiazide Swelling    Angioedema  . Penicillins     Has patient had a PCN reaction causing immediate rash, facial/tongue/throat swelling, SOB or lightheadedness with hypotension: Yes Has patient had a PCN reaction causing severe rash involving mucus membranes or skin necrosis: Unknown Has patient had a PCN reaction that required hospitalization: Unknown Has patient had a PCN reaction occurring within the last 10 years: No If all of the above answers are "NO", then may proceed with Cephalosporin  use.   Review of Systems   Pertinent items are noted in the HPI. Otherwise, ROS is negative.  Vitals:   Vitals:   04/18/17 0751  BP: 140/74  Pulse: 76  Temp: 97.9 F (36.6 C)  TempSrc: Oral  SpO2: 97%  Weight: 157 lb 12.8 oz (71.6 kg)     Body mass index is 28.86 kg/m.  Physical Exam:   Physical Exam  Constitutional: She is oriented to person, place, and time. She appears well-developed and well-nourished. No distress.  HENT:  Head: Normocephalic and atraumatic.  Right Ear: External ear normal.  Left Ear: External ear normal.  Nose: Nose normal.  Mouth/Throat: Oropharynx is clear and  moist.  Eyes: Conjunctivae and EOM are normal. Pupils are equal, round, and reactive to light.  Neck: Normal range of motion. Neck supple. No thyromegaly present.  Cardiovascular: Normal rate, regular rhythm, normal heart sounds and intact distal pulses.  Pulmonary/Chest: Effort normal and breath sounds normal.  Abdominal: Soft. Bowel sounds are normal.  Musculoskeletal: Normal range of motion.  Lymphadenopathy:    She has no cervical adenopathy.  Neurological: She is alert and oriented to person, place, and time.  Skin: Skin is warm and dry. Capillary refill takes less than 2 seconds.  Psychiatric: She has a normal mood and affect. Her behavior is normal.  Nursing note and vitals reviewed.   Assessment and Plan:   1. Hyperlipidemia, unspecified hyperlipidemia type Is the patient taking medications without problems? [x]   YES  []   NO Does the patient complain of muscle aches?   []   YES  [x]    NO Trying to exercise on a regular basis? []   YES  [x]   NO Diet Compliance: compliant most of the time. Cardiovascular ROS: no chest pain or dyspnea on exertion.   Lipids:    Component Value Date/Time   CHOL 138 09/02/2016 0848   TRIG 86 09/02/2016 0848   HDL 44 09/02/2016 0848   VLDL 17 09/02/2016 0848   CHOLHDL 3.1 09/02/2016 0848   - CBC with Differential/Platelet - Comprehensive metabolic panel  2. Essential hypertension Well controlled.  No signs of complications, medication side effects, or red flags.  Continue current regimen.    - Comprehensive metabolic panel  3. CKD (chronic kidney disease) stage 3, GFR 30-59 ml/min (HCC) Assessment: CKD: stage 3 - GFR 30-59 stable. Plan: blood pressure control, avoid NSAIDs and consult physician early if nausea, vomiting, or diarrhea.   - Comprehensive metabolic panel  4. Anemia, unspecified type Due for labs.  - CBC with Differential/Platelet  5. Estrogen deficiency Due for DEXA. Recommend calcium and vitamin D.  - DG Bone Density;  Future  6. Vitamin B 12 deficiency - Vitamin B12  7. Leg cramps Mild. No red flags. Will check labs.  - TSH  8. Gastroesophageal reflux disease without esophagitis Well controlled.  No signs of complications, medication side effects, or red flags.  Continue current regimen.    - omeprazole (PRILOSEC) 40 MG capsule; Take 1 capsule (40 mg total) by mouth daily before breakfast.  Dispense: 90 capsule; Refill: 3   . Reviewed expectations re: course of current medical issues. . Discussed self-management of symptoms. . Outlined signs and symptoms indicating need for more acute intervention. . Patient verbalized understanding and all questions were answered. Marland Kitchen Health Maintenance issues including appropriate healthy diet, exercise, and smoking avoidance were discussed with patient. . See orders for this visit as documented in the electronic medical record. . Patient received an After  Visit Summary.  CMA served as Neurosurgeon during this visit. History, Physical, and Plan performed by medical provider. The above documentation has been reviewed and is accurate and complete. Helane Rima, D.O.  Helane Rima, DO [place], [facility] 04/18/2017

## 2017-04-29 ENCOUNTER — Other Ambulatory Visit (HOSPITAL_BASED_OUTPATIENT_CLINIC_OR_DEPARTMENT_OTHER): Payer: Medicare Other

## 2017-04-30 ENCOUNTER — Ambulatory Visit (HOSPITAL_BASED_OUTPATIENT_CLINIC_OR_DEPARTMENT_OTHER)
Admission: RE | Admit: 2017-04-30 | Discharge: 2017-04-30 | Disposition: A | Discharge status: Home / Self Care | Payer: Medicare Other | Source: Ambulatory Visit | Attending: Family Medicine | Admitting: Family Medicine

## 2017-04-30 DIAGNOSIS — M810 Age-related osteoporosis without current pathological fracture: Secondary | ICD-10-CM | POA: Insufficient documentation

## 2017-04-30 DIAGNOSIS — M8588 Other specified disorders of bone density and structure, other site: Secondary | ICD-10-CM | POA: Insufficient documentation

## 2017-04-30 DIAGNOSIS — Z780 Asymptomatic menopausal state: Secondary | ICD-10-CM | POA: Diagnosis not present

## 2017-04-30 DIAGNOSIS — E2839 Other primary ovarian failure: Secondary | ICD-10-CM | POA: Insufficient documentation

## 2017-05-06 ENCOUNTER — Encounter (INDEPENDENT_AMBULATORY_CARE_PROVIDER_SITE_OTHER): Payer: Medicare Other | Admitting: Ophthalmology

## 2017-05-06 DIAGNOSIS — H318 Other specified disorders of choroid: Secondary | ICD-10-CM | POA: Diagnosis not present

## 2017-05-14 ENCOUNTER — Encounter (INDEPENDENT_AMBULATORY_CARE_PROVIDER_SITE_OTHER): Payer: Medicare Other | Admitting: Ophthalmology

## 2017-05-14 DIAGNOSIS — H353231 Exudative age-related macular degeneration, bilateral, with active choroidal neovascularization: Secondary | ICD-10-CM | POA: Diagnosis not present

## 2017-05-14 DIAGNOSIS — H318 Other specified disorders of choroid: Secondary | ICD-10-CM | POA: Diagnosis not present

## 2017-06-05 ENCOUNTER — Other Ambulatory Visit: Payer: Self-pay | Admitting: Family Medicine

## 2017-06-25 ENCOUNTER — Telehealth: Payer: Self-pay | Admitting: Family Medicine

## 2017-06-25 NOTE — Telephone Encounter (Signed)
Copied from CRM [phone]. Topic: Quick Communication - Rx Refill/Question >> Jun 25, 2017  4:21 PM Cipriano BunkerLambe, Annette S wrote:  Medication:  pravastatin (PRAVACHOL) 20 MG tablet  [facility] does not have this prescription   Has the patient contacted their pharmacy? Yes.     (Agent: If no, request that the patient contact the pharmacy for the refill.)   Preferred Pharmacy (with phone number or street name): [id number] - [place], [place] - [address] [facility] [address] & [address] [place] [id number] Phone: [phone] Fax: [phone]     Agent: Please be advised that RX refills may take up to 3 business days. We ask that you follow-up with your pharmacy.

## 2017-06-26 ENCOUNTER — Other Ambulatory Visit: Payer: Self-pay | Admitting: *Deleted

## 2017-06-26 MED ORDER — PRAVASTATIN SODIUM 20 MG PO TABS: ORAL_TABLET | ORAL | 0 refills | Status: DC

## 2017-07-10 ENCOUNTER — Encounter (INDEPENDENT_AMBULATORY_CARE_PROVIDER_SITE_OTHER): Payer: Medicare Other | Admitting: Ophthalmology

## 2017-07-10 DIAGNOSIS — H43813 Vitreous degeneration, bilateral: Secondary | ICD-10-CM | POA: Diagnosis not present

## 2017-07-10 DIAGNOSIS — H35033 Hypertensive retinopathy, bilateral: Secondary | ICD-10-CM | POA: Diagnosis not present

## 2017-07-10 DIAGNOSIS — H353231 Exudative age-related macular degeneration, bilateral, with active choroidal neovascularization: Secondary | ICD-10-CM

## 2017-07-10 DIAGNOSIS — I10 Essential (primary) hypertension: Secondary | ICD-10-CM | POA: Diagnosis not present

## 2017-07-18 ENCOUNTER — Ambulatory Visit: Payer: Medicare Other | Admitting: Family Medicine

## 2017-07-18 ENCOUNTER — Ambulatory Visit: Payer: Medicare Other | Admitting: *Deleted

## 2017-07-19 ENCOUNTER — Encounter: Payer: Self-pay | Admitting: Family Medicine

## 2017-07-19 ENCOUNTER — Ambulatory Visit (INDEPENDENT_AMBULATORY_CARE_PROVIDER_SITE_OTHER): Payer: Medicare Other | Admitting: Family Medicine

## 2017-07-19 VITALS — BP 150/70 | HR 95 | Temp 98.2°F | Ht 62.0 in | Wt 157.4 lb

## 2017-07-19 DIAGNOSIS — M816 Localized osteoporosis [Lequesne]: Secondary | ICD-10-CM

## 2017-07-19 DIAGNOSIS — I10 Essential (primary) hypertension: Secondary | ICD-10-CM | POA: Diagnosis not present

## 2017-07-19 DIAGNOSIS — E663 Overweight: Secondary | ICD-10-CM

## 2017-07-19 DIAGNOSIS — M810 Age-related osteoporosis without current pathological fracture: Secondary | ICD-10-CM | POA: Insufficient documentation

## 2017-07-19 MED ORDER — AMLODIPINE BESYLATE 5 MG PO TABS: 5.0000 mg | ORAL_TABLET | Freq: Every day | ORAL | 1 refills | Status: DC

## 2017-07-19 NOTE — Progress Notes (Signed)
Whitney Davidson is a 84 y.o. female is here for follow up.  History of Present Illness:   Barnie Mort, CMA acting as scribe for Dr. Helane Rima.   HPI:  1. Localized osteoporosis without current pathological fracture.  DEXA reviewed today with patient.  She does have osteoporosis.  She does take calcium and vitamin D regularly.  History of the same and she brought in a previous DEXA from 2009 with similar findings.  She states that she saw a rheumatologist and was offered treatment but declined at that time.  We discussed treatment options today including bisphosphonates, Prolia, and Evista.  She declines all today.  She understands the risks of fractures.  She will continue to take her supplements and strength training.   2. Essential hypertension.  Compliant with medication.  Doing well.  No chest pain, shortness of breath, edema.  Needs refill today.   Depression screen PHQ 2/9 10/15/2016  Decreased Interest 0  Down, Depressed, Hopeless 0  PHQ - 2 Score 0   PMHx, SurgHx, SocialHx, FamHx, Medications, and Allergies were reviewed in the Visit Navigator and updated as appropriate.   Patient Active Problem List   Diagnosis Date Noted  . GERD (gastroesophageal reflux disease) 10/15/2016  . Macular degeneration 10/15/2016  . Vitamin D deficiency 10/15/2016  . Essential hypertension 09/01/2016  . Hyperlipidemia 09/01/2016  . LBBB (left bundle branch block) 09/01/2016  . CKD (chronic kidney disease) stage 3, GFR 30-59 ml/min (HCC) 09/01/2016   Social History   Tobacco Use  . Smoking status: Never Smoker  . Smokeless tobacco: Never Used  Substance Use Topics  . Alcohol use: No  . Drug use: No   Current Medications and Allergies:   .  acetaminophen (TYLENOL) 325 MG tablet, Take 2 tablets (650 mg total) by mouth every 6 (six) hours as needed for mild pain or moderate pain. OTC, Disp: 30 tablet, Rfl: 0 .  amLODipine (NORVASC) 5 MG tablet, Take 1 tablet (5 mg total) by mouth  daily., Disp: 90 tablet, Rfl: 1 .  aspirin EC 81 MG tablet, Take 81 mg by mouth daily., Disp: , Rfl:  .  Cholecalciferol (D3-1000) 1000 units capsule, Take 1,000 Units by mouth daily., Disp: , Rfl:  .  Omega-3 Fatty Acids (FISH OIL) 1000 MG CAPS, Take 1,000 mg by mouth 2 (two) times daily., Disp: , Rfl:  .  omeprazole (PRILOSEC) 40 MG capsule, Take 1 capsule (40 mg total) by mouth daily before breakfast., Disp: 90 capsule, Rfl: 3 .  pravastatin (PRAVACHOL) 20 MG tablet, TAKE 1 TABLET(20 MG) BY MOUTH DAILY, Disp: 90 tablet, Rfl: 0   Allergies  Allergen Reactions  . Lisinopril-Hydrochlorothiazide Swelling    Angioedema  . Penicillins     Has patient had a PCN reaction causing immediate rash, facial/tongue/throat swelling, SOB or lightheadedness with hypotension: Yes Has patient had a PCN reaction causing severe rash involving mucus membranes or skin necrosis: Unknown Has patient had a PCN reaction that required hospitalization: Unknown Has patient had a PCN reaction occurring within the last 10 years: No If all of the above answers are "NO", then may proceed with Cephalosporin use.   Review of Systems   Pertinent items are noted in the HPI. Otherwise, ROS is negative.  Vitals:   Vitals:   07/19/17 1531  BP: (!) 150/70  Pulse: 95  Temp: 98.2 F (36.8 C)  TempSrc: Oral  SpO2: 98%  Weight: 157 lb 6.4 oz (71.4 kg)  Height: 5\' 2"  (1.575 m)  Body mass index is 28.79 kg/m.   Physical Exam:   Physical Exam  Constitutional: She appears well-nourished.  HENT:  Head: Normocephalic and atraumatic.  Eyes: Pupils are equal, round, and reactive to light. EOM are normal.  Neck: Normal range of motion. Neck supple.  Cardiovascular: Normal rate, regular rhythm, normal heart sounds and intact distal pulses.  Pulmonary/Chest: Effort normal.  Abdominal: Soft.  Skin: Skin is warm.  Psychiatric: She has a normal mood and affect. Her behavior is normal.  Nursing note and vitals  reviewed.    Results for orders placed or performed in visit on 04/18/17  CBC with Differential/Platelet  Result Value Ref Range   WBC 6.5 4.0 - 10.5 K/uL   RBC 4.69 3.87 - 5.11 Mil/uL   Hemoglobin 13.0 12.0 - 15.0 g/dL   HCT 16.140.1 09.636.0 - 04.546.0 %   MCV 85.5 78.0 - 100.0 fl   MCHC 32.4 30.0 - 36.0 g/dL   RDW 40.914.2 81.111.5 - 91.415.5 %   Platelets 299.0 150.0 - 400.0 K/uL   Neutrophils Relative % 37.4 (L) 43.0 - 77.0 %   Lymphocytes Relative 55.0 (H) 12.0 - 46.0 %   Monocytes Relative 5.6 3.0 - 12.0 %   Eosinophils Relative 1.7 0.0 - 5.0 %   Basophils Relative 0.3 0.0 - 3.0 %   Neutro Abs 2.4 1.4 - 7.7 K/uL   Lymphs Abs 3.6 0.7 - 4.0 K/uL   Monocytes Absolute 0.4 0.1 - 1.0 K/uL   Eosinophils Absolute 0.1 0.0 - 0.7 K/uL   Basophils Absolute 0.0 0.0 - 0.1 K/uL  Comprehensive metabolic panel  Result Value Ref Range   Sodium 141 135 - 145 mEq/L   Potassium 4.0 3.5 - 5.1 mEq/L   Chloride 105 96 - 112 mEq/L   CO2 26 19 - 32 mEq/L   Glucose, Bld 107 (H) 70 - 99 mg/dL   BUN 14 6 - 23 mg/dL   Creatinine, Ser 7.821.24 (H) 0.40 - 1.20 mg/dL   Total Bilirubin 0.7 0.2 - 1.2 mg/dL   Alkaline Phosphatase 86 39 - 117 U/L   AST 14 0 - 37 U/L   ALT 13 0 - 35 U/L   Total Protein 7.1 6.0 - 8.3 g/dL   Albumin 4.0 3.5 - 5.2 g/dL   Calcium 9.1 8.4 - 95.610.5 mg/dL   GFR 21.3052.90 (L) >86.57>60.00 mL/min  Vitamin B12  Result Value Ref Range   Vitamin B-12 357 211 - 911 pg/mL  TSH  Result Value Ref Range   TSH 2.57 0.35 - 4.50 uIU/mL   Assessment and Plan:   Icy was seen today for follow-up.  Diagnoses and all orders for this visit:  Localized osteoporosis without current pathological fracture Comments: Patient declines treatment at this time.  We will readdress in 2 years when her next bone scan is due.  Essential hypertension Comments: Controlled.  Continue current treatment. Orders: -     amLODipine (NORVASC) 5 MG tablet; Take 1 tablet (5 mg total) by mouth daily.  Overweight (BMI  25.0-29.9) Comments: Reviewed the importance of strength training.   . Reviewed expectations re: course of current medical issues. . Discussed self-management of symptoms. . Outlined signs and symptoms indicating need for more acute intervention. . Patient verbalized understanding and all questions were answered. Marland Kitchen. Health Maintenance issues including appropriate healthy diet, exercise, and smoking avoidance were discussed with patient. . See orders for this visit as documented in the electronic medical record. . Patient received an After Visit Summary.  Helane Rima, DO [place], [facility] 07/19/2017  Future Appointments  Date Time Provider Department Center  10/01/2017  7:30 AM Sherrie George, MD [facility] None  10/21/2017  9:40 AM Helane Rima, DO [facility]

## 2017-09-18 ENCOUNTER — Telehealth: Payer: Self-pay | Admitting: Family Medicine

## 2017-09-18 MED ORDER — PRAVASTATIN SODIUM 20 MG PO TABS: ORAL_TABLET | ORAL | 0 refills | Status: DC

## 2017-09-18 NOTE — Telephone Encounter (Signed)
Copied from CRM [id number]. Topic: Inquiry >> Sep 18, 2017  7:59 AM Yvonna Alanisobinson, Andra M wrote: Reason for CRM: Patient called requesting refills of AmLODipine (NORVASC) 5 MG tablet and Pravastatin (PRAVACHOL) 20 MG tablet. [id number] [place] - [address] AT [facility] [address] & [address] [phone] (Phone) [phone] (Fax).       Thank You!!!

## 2017-09-18 NOTE — Telephone Encounter (Signed)
Request refill on Amlodipine and Pravastatin. Last refill on Amlodipine; 07/19/17; # 90; RF x 1 Last refill on Pravastatin: 06/26/17; # 90; no refills Last office visit 07/19/17 PCP: Dr. Earlene PlaterWallace [facility] [place]   Phone call to pt.; advised she should have refill on Amlodipine.  Called [facility]; confirmed that there is a current Rx on file for Amlodipine 5 mg.; Pharmacy Tech will get this ready for the pt.      Pravastin refilled per protocol.  Pt. notified of the above.

## 2017-10-01 ENCOUNTER — Encounter (INDEPENDENT_AMBULATORY_CARE_PROVIDER_SITE_OTHER): Payer: Medicare Other | Admitting: Ophthalmology

## 2017-10-01 DIAGNOSIS — I10 Essential (primary) hypertension: Secondary | ICD-10-CM | POA: Diagnosis not present

## 2017-10-01 DIAGNOSIS — H353231 Exudative age-related macular degeneration, bilateral, with active choroidal neovascularization: Secondary | ICD-10-CM | POA: Diagnosis not present

## 2017-10-01 DIAGNOSIS — H35033 Hypertensive retinopathy, bilateral: Secondary | ICD-10-CM | POA: Diagnosis not present

## 2017-10-01 DIAGNOSIS — H43813 Vitreous degeneration, bilateral: Secondary | ICD-10-CM

## 2017-10-20 NOTE — Progress Notes (Signed)
Whitney Davidson is a 84 y.o. female is here for follow up.  History of Present Illness:   HPI:  1. Essential hypertension, on Norvasc and ASA. Compliant. No CP, SOB, edema. No dizziness.   2. Mixed hyperlipidemia. Compliant with Pravastatin. No myalgias.   3. CKD (chronic kidney disease) stage 3, GFR 30-59 ml/min (HCC).   Lab Results  Component Value Date   CREATININE 1.34 (H) 10/21/2017   CREATININE 1.24 (H) 04/18/2017   CREATININE 1.34 (H) 10/15/2016    4. Localized osteoporosis without current pathological fracture, takes calcium and D, refuses medication.   5. Overweight (BMI 25.0-29.9).   Wt Readings from Last 3 Encounters:  10/21/17 155 lb 9.6 oz (70.6 kg)  07/19/17 157 lb 6.4 oz (71.4 kg)  04/18/17 157 lb 12.8 oz (71.6 kg)      There are no preventive care reminders to display for this patient. [facility] 10/26/2017 10/15/2016  Decreased Interest 0 0  Down, Depressed, Hopeless 0 0  PHQ - 2 Score 0 0   PMHx, SurgHx, SocialHx, FamHx, Medications, and Allergies were reviewed in the Visit Navigator and updated as appropriate.   Patient Active Problem List   Diagnosis Date Noted  . Localized osteoporosis without current pathological fracture, takes calcium and D, refuses medication 07/19/2017  . Overweight (BMI 25.0-29.9) 07/19/2017  . GERD (gastroesophageal reflux disease), on Omeprazole 10/15/2016  . Macular degeneration 10/15/2016  . Vitamin D deficiency 10/15/2016  . Essential hypertension, on Norvasc and ASA 09/01/2016  . Hyperlipidemia, on Pravastatin 09/01/2016  . LBBB (left bundle branch block) 09/01/2016  . CKD (chronic kidney disease) stage 3, GFR 30-59 ml/min (HCC) 09/01/2016   Social History   Tobacco Use  . Smoking status: Never Smoker  . Smokeless tobacco: Never Used  Substance Use Topics  . Alcohol use: No  . Drug use: No   Current Medications and Allergies:   Current Outpatient Medications:  .  acetaminophen (TYLENOL) 325 MG  tablet, Take 2 tablets (650 mg total) by mouth every 6 (six) hours as needed for mild pain or moderate pain. OTC, Disp: 30 tablet, Rfl: 0 .  amLODipine (NORVASC) 5 MG tablet, Take 1 tablet (5 mg total) by mouth daily., Disp: 90 tablet, Rfl: 1 .  aspirin EC 81 MG tablet, Take 81 mg by mouth daily., Disp: , Rfl:  .  Cholecalciferol (D3-1000) 1000 units capsule, Take 1,000 Units by mouth daily., Disp: , Rfl:  .  Omega-3 Fatty Acids (FISH OIL) 1000 MG CAPS, Take 1,000 mg by mouth 2 (two) times daily., Disp: , Rfl:  .  omeprazole (PRILOSEC) 40 MG capsule, Take 1 capsule (40 mg total) by mouth daily before breakfast., Disp: 90 capsule, Rfl: 3 .  pravastatin (PRAVACHOL) 20 MG tablet, TAKE 1 TABLET(20 MG) BY MOUTH DAILY, Disp: 90 tablet, Rfl: 0 .  trimethoprim-polymyxin b (POLYTRIM) ophthalmic solution, INT 1 GTT INTO OS QID 2 DAYS AFTER EACH MONTHLY INJECTION, Disp: , Rfl: 12   Allergies  Allergen Reactions  . Lisinopril-Hydrochlorothiazide Swelling    Angioedema  . Penicillins     Has patient had a PCN reaction causing immediate rash, facial/tongue/throat swelling, SOB or lightheadedness with hypotension: Yes Has patient had a PCN reaction causing severe rash involving mucus membranes or skin necrosis: Unknown Has patient had a PCN reaction that required hospitalization: Unknown Has patient had a PCN reaction occurring within the last 10 years: No If all of the above answers are "NO", then may proceed with Cephalosporin use.  Review of Systems   Pertinent items are noted in the HPI. Otherwise, ROS is negative.  Vitals:   Vitals:   10/21/17 0951  BP: (!) 152/74  Pulse: 78  Temp: 97.9 F (36.6 C)  TempSrc: Oral  SpO2: 95%  Weight: 155 lb 9.6 oz (70.6 kg)  Height: 5\' 2"  (1.575 m)     Body mass index is 28.46 kg/m.  Physical Exam:   Physical Exam  Constitutional: She appears well-nourished.  HENT:  Head: Normocephalic and atraumatic.  Eyes: Pupils are equal, round, and reactive  to light. EOM are normal.  Neck: Normal range of motion. Neck supple.  Cardiovascular: Normal rate, regular rhythm, normal heart sounds and intact distal pulses.  Pulmonary/Chest: Effort normal.  Abdominal: Soft.  Skin: Skin is warm.  Psychiatric: She has a normal mood and affect. Her behavior is normal.  Nursing note and vitals reviewed.  Results for orders placed or performed in visit on 10/21/17  CBC with Differential/Platelet  Result Value Ref Range   WBC 6.0 4.0 - 10.5 K/uL   RBC 4.56 3.87 - 5.11 Mil/uL   Hemoglobin 12.6 12.0 - 15.0 g/dL   HCT 16.1 09.6 - 04.5 %   MCV 84.2 78.0 - 100.0 fl   MCHC 32.8 30.0 - 36.0 g/dL   RDW 40.9 81.1 - 91.4 %   Platelets 275.0 150.0 - 400.0 K/uL   Neutrophils Relative % 45.8 43.0 - 77.0 %   Lymphocytes Relative 47.0 (H) 12.0 - 46.0 %   Monocytes Relative 5.6 3.0 - 12.0 %   Eosinophils Relative 1.2 0.0 - 5.0 %   Basophils Relative 0.4 0.0 - 3.0 %   Neutro Abs 2.7 1.4 - 7.7 K/uL   Lymphs Abs 2.8 0.7 - 4.0 K/uL   Monocytes Absolute 0.3 0.1 - 1.0 K/uL   Eosinophils Absolute 0.1 0.0 - 0.7 K/uL   Basophils Absolute 0.0 0.0 - 0.1 K/uL  Comprehensive metabolic panel  Result Value Ref Range   Sodium 141 135 - 145 mEq/L   Potassium 4.7 3.5 - 5.1 mEq/L   Chloride 104 96 - 112 mEq/L   CO2 28 19 - 32 mEq/L   Glucose, Bld 157 (H) 70 - 99 mg/dL   BUN 17 6 - 23 mg/dL   Creatinine, Ser 7.82 (H) 0.40 - 1.20 mg/dL   Total Bilirubin 0.6 0.2 - 1.2 mg/dL   Alkaline Phosphatase 81 39 - 117 U/L   AST 13 0 - 37 U/L   ALT 13 0 - 35 U/L   Total Protein 6.7 6.0 - 8.3 g/dL   Albumin 3.9 3.5 - 5.2 g/dL   Calcium 9.1 8.4 - 95.6 mg/dL   GFR 21.30 (L) >86.57 mL/min    Assessment and Plan:   Whitney Davidson was seen today for follow-up.  Diagnoses and all orders for this visit:  Mixed hyperlipidemia, on Pravastatin -     Comprehensive metabolic panel  CKD (chronic kidney disease) stage 3, GFR 30-59 ml/min (HCC) -     CBC with Differential/Platelet -      Comprehensive metabolic panel  Localized osteoporosis without current pathological fracture, takes calcium and D, refuses medication  Overweight (BMI 25.0-29.9) Comments: Encouraged strength training.   Essential hypertension Comments: Controlled.  Continue current treatment. Orders: -     amLODipine (NORVASC) 5 MG tablet; Take 1 tablet (5 mg total) by mouth daily. -     pravastatin (PRAVACHOL) 20 MG tablet; TAKE 1 TABLET(20 MG) BY MOUTH DAILY  Gastroesophageal reflux disease without  esophagitis -     omeprazole (PRILOSEC) 40 MG capsule; Take 1 capsule (40 mg total) by mouth daily before breakfast.    . Reviewed expectations re: course of current medical issues. . Discussed self-management of symptoms. . Outlined signs and symptoms indicating need for more acute intervention. . Patient verbalized understanding and all questions were answered. Marland Kitchen Health Maintenance issues including appropriate healthy diet, exercise, and smoking avoidance were discussed with patient. . See orders for this visit as documented in the electronic medical record. . Patient received an After Visit Summary.  Helane Rima, DO [place] 10/26/2017  Future Appointments  Date Time Provider Department Center  10/29/2017 11:00 AM [facility]  12/24/2017  7:30 AM Sherrie George, MD [facility] None    CMA served as scribe during this visit. History, Physical, and Plan performed by medical provider. The above documentation has been reviewed and is accurate and complete. Helane Rima, D.O.

## 2017-10-21 ENCOUNTER — Encounter: Payer: Self-pay | Admitting: Family Medicine

## 2017-10-21 ENCOUNTER — Ambulatory Visit (INDEPENDENT_AMBULATORY_CARE_PROVIDER_SITE_OTHER): Payer: Medicare Other | Admitting: Family Medicine

## 2017-10-21 VITALS — BP 152/74 | HR 78 | Temp 97.9°F | Ht 62.0 in | Wt 155.6 lb

## 2017-10-21 DIAGNOSIS — N183 Chronic kidney disease, stage 3 unspecified: Secondary | ICD-10-CM

## 2017-10-21 DIAGNOSIS — E663 Overweight: Secondary | ICD-10-CM | POA: Diagnosis not present

## 2017-10-21 DIAGNOSIS — M816 Localized osteoporosis [Lequesne]: Secondary | ICD-10-CM

## 2017-10-21 DIAGNOSIS — K219 Gastro-esophageal reflux disease without esophagitis: Secondary | ICD-10-CM

## 2017-10-21 DIAGNOSIS — E782 Mixed hyperlipidemia: Secondary | ICD-10-CM

## 2017-10-21 DIAGNOSIS — I10 Essential (primary) hypertension: Secondary | ICD-10-CM | POA: Diagnosis not present

## 2017-10-21 LAB — CBC WITH DIFFERENTIAL/PLATELET
Basophils Absolute: 0 10*3/uL (ref 0.0–0.1)
Basophils Relative: 0.4 % (ref 0.0–3.0)
Eosinophils Absolute: 0.1 10*3/uL (ref 0.0–0.7)
Eosinophils Relative: 1.2 % (ref 0.0–5.0)
HCT: 38.4 % (ref 36.0–46.0)
Hemoglobin: 12.6 g/dL (ref 12.0–15.0)
Lymphocytes Relative: 47 % — ABNORMAL HIGH (ref 12.0–46.0)
Lymphs Abs: 2.8 10*3/uL (ref 0.7–4.0)
MCHC: 32.8 g/dL (ref 30.0–36.0)
MCV: 84.2 fl (ref 78.0–100.0)
Monocytes Absolute: 0.3 10*3/uL (ref 0.1–1.0)
Monocytes Relative: 5.6 % (ref 3.0–12.0)
Neutro Abs: 2.7 10*3/uL (ref 1.4–7.7)
Neutrophils Relative %: 45.8 % (ref 43.0–77.0)
Platelets: 275 10*3/uL (ref 150.0–400.0)
RBC: 4.56 Mil/uL (ref 3.87–5.11)
RDW: 14.3 % (ref 11.5–15.5)
WBC: 6 10*3/uL (ref 4.0–10.5)

## 2017-10-21 LAB — COMPREHENSIVE METABOLIC PANEL
ALT: 13 U/L (ref 0–35)
AST: 13 U/L (ref 0–37)
Albumin: 3.9 g/dL (ref 3.5–5.2)
Alkaline Phosphatase: 81 U/L (ref 39–117)
BUN: 17 mg/dL (ref 6–23)
CO2: 28 mEq/L (ref 19–32)
Calcium: 9.1 mg/dL (ref 8.4–10.5)
Chloride: 104 mEq/L (ref 96–112)
Creatinine, Ser: 1.34 mg/dL — ABNORMAL HIGH (ref 0.40–1.20)
GFR: 48.31 mL/min — ABNORMAL LOW (ref >60.00)
Glucose, Bld: 157 mg/dL — ABNORMAL HIGH (ref 70–99)
Potassium: 4.7 mEq/L (ref 3.5–5.1)
Sodium: 141 mEq/L (ref 135–145)
Total Bilirubin: 0.6 mg/dL (ref 0.2–1.2)
Total Protein: 6.7 g/dL (ref 6.0–8.3)

## 2017-10-21 MED ORDER — OMEPRAZOLE 40 MG PO CPDR: 40.0000 mg | DELAYED_RELEASE_CAPSULE | Freq: Every day | ORAL | 3 refills | Status: DC

## 2017-10-21 MED ORDER — AMLODIPINE BESYLATE 5 MG PO TABS: 5.0000 mg | ORAL_TABLET | Freq: Every day | ORAL | 1 refills | Status: DC

## 2017-10-21 MED ORDER — PRAVASTATIN SODIUM 20 MG PO TABS: ORAL_TABLET | ORAL | 2 refills | Status: DC

## 2017-10-26 ENCOUNTER — Encounter: Payer: Self-pay | Admitting: Family Medicine

## 2017-10-28 NOTE — Progress Notes (Signed)
PCP notes:None   Health maintenance: Up to date.   Abnormal screenings: Hearing, Sees an Audiologist, bilateral hearing aids   Patient concerns: leg cramps, encouraged more water and discussed teaspoon of mustard, if no improvement will schedule with Dr. Earlene PlaterWallace   Nurse concerns: None   Next PCP appt: Schedule Follow Up visit at checkout

## 2017-10-28 NOTE — Progress Notes (Signed)
Subjective:   Whitney Davidson is a 84 y.o. female who presents for an Initial Medicare Annual Wellness Visit.  Review of Systems    No ROS.  Medicare Wellness Visit. Additional risk factors are reflected in the social history.  Lives in single story home alone. No pets. Niece lives next door.  Cardiac Risk Factors include: advanced age (>8men, >25 women)     Objective:    Today's Vitals   10/29/17 1141  BP: 128/64  Pulse: 70  SpO2: 95%  Weight: 157 lb 3.2 oz (71.3 kg)  Height: 5\' 2"  (1.575 m)   Body mass index is 28.75 kg/m.  Advanced Directives 10/29/2017 09/02/2016  Does Patient Have a Medical Advance Directive? Yes No  Type of Estate agent of [place];Living will -  Does patient want to make changes to medical advance directive? No - Patient declined -  Copy of Healthcare Power of Attorney in Chart? No - copy requested -  Would patient like information on creating a medical advance directive? - No - Patient declined    Current Medications (verified) Outpatient Encounter Medications as of 10/29/2017  Medication Sig  . acetaminophen (TYLENOL) 325 MG tablet Take 2 tablets (650 mg total) by mouth every 6 (six) hours as needed for mild pain or moderate pain. OTC  . amLODipine (NORVASC) 5 MG tablet Take 1 tablet (5 mg total) by mouth daily.  Marland Kitchen aspirin EC 81 MG tablet Take 81 mg by mouth daily.  . Cholecalciferol (D3-1000) 1000 units capsule Take 1,000 Units by mouth daily.  . Omega-3 Fatty Acids (FISH OIL) 1000 MG CAPS Take 1,000 mg by mouth 2 (two) times daily.  Marland Kitchen omeprazole (PRILOSEC) 40 MG capsule Take 1 capsule (40 mg total) by mouth daily before breakfast.  . pravastatin (PRAVACHOL) 20 MG tablet TAKE 1 TABLET(20 MG) BY MOUTH DAILY  . trimethoprim-polymyxin b (POLYTRIM) ophthalmic solution INT 1 GTT INTO OS QID 2 DAYS AFTER EACH MONTHLY INJECTION   No facility-administered encounter medications on file as of 10/29/2017.     Allergies  (verified) Lisinopril-hydrochlorothiazide and Penicillins   History: Past Medical History:  Diagnosis Date  . Anemia 10/15/2016  . CKD (chronic kidney disease) stage 3, GFR 30-59 ml/min (HCC) 09/01/2016  . GERD (gastroesophageal reflux disease) 10/15/2016  . High cholesterol   . Hypertension   . LBBB (left bundle branch block) 09/01/2016  . Macular degeneration 10/15/2016  . Syncope 09/01/2016   Due to dehydration. See ER visit on 09/01/2016.  . Vitamin D deficiency 10/15/2016   No past surgical history on file. Family History  Problem Relation Age of Onset  . Heart disease Mother   . Hypertension Father   . Early death Brother   . Heart disease Paternal Grandmother   . Heart disease Brother   . Heart disease Brother    Social History   Socioeconomic History  . Marital status: Widowed    Spouse name: Not on file  . Number of children: Not on file  . Years of education: Not on file  . Highest education level: Not on file  Occupational History  . Not on file  Social Needs  . Financial resource strain: Not on file  . Food insecurity:    Worry: Not on file    Inability: Not on file  . Transportation needs:    Medical: Not on file    Non-medical: Not on file  Tobacco Use  . Smoking status: Never Smoker  . Smokeless tobacco: Never Used  Substance and Sexual Activity  . Alcohol use: No  . Drug use: No  . Sexual activity: Not Currently    Birth control/protection: Post-menopausal  Lifestyle  . Physical activity:    Days per week: Not on file    Minutes per session: Not on file  . Stress: Not on file  Relationships  . Social connections:    Talks on phone: Not on file    Gets together: Not on file    Attends religious service: Not on file    Active member of club or organization: Not on file    Attends meetings of clubs or organizations: Not on file    Relationship status: Not on file  Other Topics Concern  . Not on file  Social History Narrative  . Not on  file    Tobacco Counseling Counseling given: Not Answered  Activities of Daily Living In your present state of health, do you have any difficulty performing the following activities: 10/29/2017  Hearing? Y  Comment Hearing Aids in bilaterally  Vision? N  Difficulty concentrating or making decisions? N  Walking or climbing stairs? N  Dressing or bathing? N  Doing errands, shopping? N  Preparing Food and eating ? N  Using the Toilet? N  In the past six months, have you accidently leaked urine? N  Do you have problems with loss of bowel control? N  Managing your Medications? N  Managing your Finances? N  Housekeeping or managing your Housekeeping? N  Some recent data might be hidden     Immunizations and Health Maintenance  There is no immunization history on file for this patient. There are no preventive care reminders to display for this patient.  Patient Care Team: Helane RimaWallace, Erica, DO as PCP - General (Family Medicine)  Indicate any recent Medical Services you may have received from other than [facility] providers in the past year (date may be approximate).     Assessment:   This is a routine wellness examination for Whitney Davidson.  Hearing/Vision screen  Visual Acuity Screening   Right eye Left eye Both eyes  Without correction:     With correction: 20/20 20/25 20/25   Comments: Wears glasses Gets monthly injections in left eye (Dr. Ashley RoyaltyMatthews)  Hearing Screening Comments: Wears hearing aids bilaterally Could not hear any of the frequencies done in testing here in the office [facility] [place] [phone]  Dietary issues and exercise activities discussed: Current Exercise Habits: Home exercise routine, Type of exercise: walking, Time (Minutes): 45, Frequency (Times/Week): 6, Weekly Exercise (Minutes/Week): 270, Intensity: Mild   Breakfast: [place], home fries, grits, english muffins, egg, and hot tea, sometimes coffee (cream and sugar) Lunch: eats  leftovers, skips lunch Dinner: Cabbage, Fried chicken, spaghetti  Drinks 2-3 bottles of water a day  Drinks 1 soda every couple of days  Goals    . Increase physical activity     Walk 6 days a week/ 45 minutes      Depression Screen PHQ 2/9 Scores 10/29/2017 10/26/2017 10/15/2016  PHQ - 2 Score 0 0 0  PHQ- 9 Score 1 - -    Fall Risk Fall Risk  10/29/2017 10/29/2017 10/21/2017 10/15/2016  Falls in the past year? No No No No   Cognitive Function:     6CIT Screen 10/29/2017  What Year? 0 points  What month? 0 points  What time? 0 points  Count back from 20 0 points  Months in reverse 0 points    Screening  Tests Health Maintenance  Topic Date Due  . DEXA SCAN  Completed  . INFLUENZA VACCINE  Discontinued  . TETANUS/TDAP  Discontinued  . PNA vac Low Risk Adult  Discontinued     Plan:   Follow up with PCP as directed.   I have personally reviewed and noted the following in the patient's chart:   . Medical and social history . Use of alcohol, tobacco or illicit drugs  . Current medications and supplements . Functional ability and status . Nutritional status . Physical activity . Advanced directives . List of other physicians . Vitals . Screenings to include cognitive, depression, and falls . Referrals and appointments  In addition, I have reviewed and discussed with patient certain preventive protocols, quality metrics, and best practice recommendations. A written personalized care plan for preventive services as well as general preventive health recommendations were provided to patient.     Angelina Sheriff [place], [place]   1/61/0960

## 2017-10-29 ENCOUNTER — Ambulatory Visit (INDEPENDENT_AMBULATORY_CARE_PROVIDER_SITE_OTHER): Payer: Medicare Other

## 2017-10-29 VITALS — BP 128/64 | HR 70 | Ht 62.0 in | Wt 157.2 lb

## 2017-10-29 DIAGNOSIS — Z0000 Encounter for general adult medical examination without abnormal findings: Secondary | ICD-10-CM

## 2017-10-29 NOTE — Progress Notes (Signed)
I have personally reviewed the Medicare Annual Wellness questionnaire and have noted 1. The patient's medical and social history 2. Their use of alcohol, tobacco or illicit drugs 3. Their current medications and supplements 4. The patient's functional ability including ADL's, fall risks, home safety risks and hearing or visual impairment. 5. Diet and physical activities 6. Evidence for depression or mood disorders 7. Reviewed Updated provider list, see scanned forms and [facility] Snapshot.   The patients weight, height, BMI and visual acuity have been recorded in the chart I have made referrals, counseling and provided education to the patient based review of the above and I have provided the pt with a written personalized care plan for preventive services.  I have provided the patient with a copy of your personalized plan for preventive services. Instructed to take the time to review along with their updated medication list.  Safiya Girdler, DO  

## 2017-10-29 NOTE — Patient Instructions (Signed)
Ms. Whitney Davidson , Thank you for taking time to come for your Medicare Wellness Visit. I appreciate your ongoing commitment to your health goals. Please review the following plan we discussed and let me know if I can assist you in the future.   These are the goals we discussed: Goals    . Increase physical activity     Walk 6 days a week/ 45 minutes       This is a list of the screening recommended for you and due dates:  Health Maintenance  Topic Date Due  . DEXA scan (bone density measurement)  Completed  . Flu Shot  Discontinued  . Tetanus Vaccine  Discontinued  . Pneumonia vaccines  Discontinued   Preventive Care for Adults  A healthy lifestyle and preventive care can promote health and wellness. Preventive health guidelines for adults include the following key practices.  . A routine yearly physical is a good way to check with your health care provider about your health and preventive screening. It is a chance to share any concerns and updates on your health and to receive a thorough exam.  . Visit your dentist for a routine exam and preventive care every 6 months. Brush your teeth twice a day and floss once a day. Good oral hygiene prevents tooth decay and gum disease.  . The frequency of eye exams is based on your age, health, family medical history, use  of contact lenses, and other factors. Follow your health care provider's recommendations for frequency of eye exams.  . Eat a healthy diet. Foods like vegetables, fruits, whole grains, low-fat dairy products, and lean protein foods contain the nutrients you need without too many calories. Decrease your intake of foods high in solid fats, added sugars, and salt. Eat the right amount of calories for you. Get information about a proper diet from your health care provider, if necessary.  . Regular physical exercise is one of the most important things you can do for your health. Most adults should get at least 150 minutes of  moderate-intensity exercise (any activity that increases your heart rate and causes you to sweat) each week. In addition, most adults need muscle-strengthening exercises on 2 or more days a week.  Silver Sneakers may be a benefit available to you. To determine eligibility, you may visit the website: www.silversneakers.com or contact program at [phone] Mon-Fri between 8AM-8PM.   . Maintain a healthy weight. The body mass index (BMI) is a screening tool to identify possible weight problems. It provides an estimate of body fat based on height and weight. Your health care provider can find your BMI and can help you achieve or maintain a healthy weight.   For adults 20 years and older: ? A BMI below 18.5 is considered underweight. ? A BMI of 18.5 to 24.9 is normal. ? A BMI of 25 to 29.9 is considered overweight. ? A BMI of 30 and above is considered obese.   . Maintain normal blood lipids and cholesterol levels by exercising and minimizing your intake of saturated fat. Eat a balanced diet with plenty of fruit and vegetables. Blood tests for lipids and cholesterol should begin at age 82 and be repeated every 5 years. If your lipid or cholesterol levels are high, you are over 50, or you are at high risk for heart disease, you may need your cholesterol levels checked more frequently. Ongoing high lipid and cholesterol levels should be treated with medicines if diet and exercise are not working.  .Marland Kitchen  If you smoke, find out from your health care provider how to quit. If you do not use tobacco, please do not start.  . If you choose to drink alcohol, please do not consume more than 2 drinks per day. One drink is considered to be 12 ounces (355 mL) of beer, 5 ounces (148 mL) of wine, or 1.5 ounces (44 mL) of liquor.  . If you are 66-15 years old, ask your health care provider if you should take aspirin to prevent strokes.  . Use sunscreen. Apply sunscreen liberally and repeatedly throughout the day. You  should seek shade when your shadow is shorter than you. Protect yourself by wearing long sleeves, pants, a wide-brimmed hat, and sunglasses year round, whenever you are outdoors.  . Once a month, do a whole body skin exam, using a mirror to look at the skin on your back. Tell your health care provider of new moles, moles that have irregular borders, moles that are larger than a pencil eraser, or moles that have changed in shape or color.

## 2017-12-10 ENCOUNTER — Ambulatory Visit: Payer: Self-pay | Admitting: *Deleted

## 2017-12-10 NOTE — Telephone Encounter (Signed)
Pt reports cough and "congestion", onset yesterday. States cough is productive for thick, whitish phlegm. Reports tenderness at sinus areas, chills and sweating at HS, unsure if febrile. Denies SOB,wheezing. States "Nose runny." Has taken multiple OTC medications, cannot recall names. Pt's friend present during call, states pt is  weaker than usual. Appt made for tomorrow with S. Worley. Care advise given per protocol.  Reason for Disposition . [1] Continuous (nonstop) coughing interferes with work or school AND [2] no improvement using cough treatment per Care Advice  Answer Assessment - Initial Assessment Questions 1. ONSET: "When did the cough begin?"      Yesterday 2. SEVERITY: "How bad is the cough today?"      Moderate 3. RESPIRATORY DISTRESS: "Describe your breathing."      Denies SOB 4. FEVER: "Do you have a fever?" If so, ask: "What is your temperature, how was it measured, and when did it start?"     Chills/sweating at night, no thermometer. 5. SPUTUM: "Describe the color of your sputum" (clear, white, yellow, green)     White 6. HEMOPTYSIS: "Are you coughing up any blood?" If so ask: "How much?" (flecks, streaks, tablespoons, etc.)     no 7. CARDIAC HISTORY: "Do you have any history of heart disease?" (e.g., heart attack, congestive heart failure)      LBBB, HTN 8. LUNG HISTORY: "Do you have any history of lung disease?"  (e.g., pulmonary embolus, asthma, emphysema)     no 9. PE RISK FACTORS: "Do you have a history of blood clots?" (or: recent major surgery, recent prolonged travel, bedridden)     no 10. OTHER SYMPTOMS: "Do you have any other symptoms?" (e.g., runny nose, wheezing, chest pain)       Tenderness at sinus area  Protocols used: COUGH - ACUTE PRODUCTIVE-A-AH

## 2017-12-11 ENCOUNTER — Encounter: Payer: Self-pay | Admitting: Physician Assistant

## 2017-12-11 ENCOUNTER — Ambulatory Visit (INDEPENDENT_AMBULATORY_CARE_PROVIDER_SITE_OTHER): Payer: Medicare Other | Admitting: Physician Assistant

## 2017-12-11 VITALS — BP 120/60 | HR 91 | Temp 98.6°F | Ht 62.0 in | Wt 153.0 lb

## 2017-12-11 DIAGNOSIS — J069 Acute upper respiratory infection, unspecified: Secondary | ICD-10-CM

## 2017-12-11 MED ORDER — BENZONATATE 100 MG PO CAPS: 100.0000 mg | ORAL_CAPSULE | Freq: Three times a day (TID) | ORAL | 1 refills | Status: DC | PRN

## 2017-12-11 MED ORDER — DOXYCYCLINE HYCLATE 100 MG PO TABS
100.0000 mg | ORAL_TABLET | Freq: Two times a day (BID) | ORAL | 0 refills | Status: DC
Start: 2017-12-11 — End: 2018-01-29

## 2017-12-11 NOTE — Progress Notes (Signed)
Whitney Davidson is a 84 y.o. female here for a new problem.  I acted as a Neurosurgeon for [employer], LPN  History of Present Illness:   Chief Complaint  Patient presents with  . Cough    Cough  This is a new problem. The current episode started yesterday. The problem has been gradually improving. The problem occurs every few hours. The cough is productive of sputum. Associated symptoms include chills, a fever (100.1 yesterday), nasal congestion and postnasal drip. Pertinent negatives include no ear pain, headaches, sore throat or shortness of breath. Associated symptoms comments: Nose bleed this AM, lasted about 5 minutes.. The symptoms are aggravated by lying down. She has tried OTC cough suppressant (Corciden, Delsym) for the symptoms. The treatment provided mild relief. There is no history of bronchiectasis or pneumonia.   Went to visit her niece recently who was very sick, Thursday or Friday, she cannot remember. Yesterday had some more fatigue than usual.  Pushing fluids. Denies SOB or CP.    Past Medical History:  Diagnosis Date  . Anemia 10/15/2016  . CKD (chronic kidney disease) stage 3, GFR 30-59 ml/min (HCC) 09/01/2016  . GERD (gastroesophageal reflux disease) 10/15/2016  . High cholesterol   . Hypertension   . LBBB (left bundle branch block) 09/01/2016  . Macular degeneration 10/15/2016  . Syncope 09/01/2016   Due to dehydration. See ER visit on 09/01/2016.  . Vitamin D deficiency 10/15/2016     Social History   Socioeconomic History  . Marital status: Widowed    Spouse name: Not on file  . Number of children: Not on file  . Years of education: Not on file  . Highest education level: Not on file  Occupational History  . Not on file  Social Needs  . Financial resource strain: Not on file  . Food insecurity:    Worry: Not on file    Inability: Not on file  . Transportation needs:    Medical: Not on file    Non-medical: Not on file  Tobacco Use   . Smoking status: Never Smoker  . Smokeless tobacco: Never Used  Substance and Sexual Activity  . Alcohol use: No  . Drug use: No  . Sexual activity: Not Currently    Birth control/protection: Post-menopausal  Lifestyle  . Physical activity:    Days per week: Not on file    Minutes per session: Not on file  . Stress: Not on file  Relationships  . Social connections:    Talks on phone: Not on file    Gets together: Not on file    Attends religious service: Not on file    Active member of club or organization: Not on file    Attends meetings of clubs or organizations: Not on file    Relationship status: Not on file  . Intimate partner violence:    Fear of current or ex partner: Not on file    Emotionally abused: Not on file    Physically abused: Not on file    Forced sexual activity: Not on file  Other Topics Concern  . Not on file  Social History Narrative  . Not on file    No past surgical history on file.  Family History  Problem Relation Age of Onset  . Heart disease Mother   . Hypertension Father   . Early death Brother   . Heart disease Paternal Grandmother   . Heart disease Brother   . Heart disease  Brother     Allergies  Allergen Reactions  . Lisinopril-Hydrochlorothiazide Swelling    Angioedema  . Penicillins     Has patient had a PCN reaction causing immediate rash, facial/tongue/throat swelling, SOB or lightheadedness with hypotension: Yes Has patient had a PCN reaction causing severe rash involving mucus membranes or skin necrosis: Unknown Has patient had a PCN reaction that required hospitalization: Unknown Has patient had a PCN reaction occurring within the last 10 years: No If all of the above answers are "NO", then may proceed with Cephalosporin use.    Current Medications:   Current Outpatient Medications:  .  acetaminophen (TYLENOL) 325 MG tablet, Take 2 tablets (650 mg total) by mouth every 6 (six) hours as needed for mild pain or moderate  pain. OTC, Disp: 30 tablet, Rfl: 0 .  amLODipine (NORVASC) 5 MG tablet, Take 1 tablet (5 mg total) by mouth daily., Disp: 90 tablet, Rfl: 1 .  aspirin EC 81 MG tablet, Take 81 mg by mouth daily., Disp: , Rfl:  .  benzonatate (TESSALON) 100 MG capsule, Take 1 capsule (100 mg total) by mouth 3 (three) times daily as needed for cough., Disp: 20 capsule, Rfl: 1 .  Cholecalciferol (D3-1000) 1000 units capsule, Take 1,000 Units by mouth daily., Disp: , Rfl:  .  doxycycline (VIBRA-TABS) 100 MG tablet, Take 1 tablet (100 mg total) by mouth 2 (two) times daily., Disp: 20 tablet, Rfl: 0 .  Omega-3 Fatty Acids (FISH OIL) 1000 MG CAPS, Take 1,000 mg by mouth 2 (two) times daily., Disp: , Rfl:  .  omeprazole (PRILOSEC) 40 MG capsule, Take 1 capsule (40 mg total) by mouth daily before breakfast., Disp: 90 capsule, Rfl: 3 .  pravastatin (PRAVACHOL) 20 MG tablet, TAKE 1 TABLET(20 MG) BY MOUTH DAILY, Disp: 90 tablet, Rfl: 2 .  trimethoprim-polymyxin b (POLYTRIM) ophthalmic solution, INT 1 GTT INTO OS QID 2 DAYS AFTER EACH MONTHLY INJECTION, Disp: , Rfl: 12   Review of Systems:   Review of Systems  Constitutional: Positive for chills and fever (100.1 yesterday).  HENT: Positive for postnasal drip. Negative for ear pain and sore throat.   Respiratory: Positive for cough. Negative for shortness of breath.   Neurological: Negative for headaches.    Vitals:   Vitals:   12/11/17 1051  BP: 120/60  Pulse: 91  Temp: 98.6 F (37 C)  TempSrc: Oral  SpO2: 95%  Weight: 153 lb (69.4 kg)  Height: 5\' 2"  (1.575 m)     Body mass index is 27.98 kg/m.  Physical Exam:   Physical Exam  Constitutional: She appears well-developed. She is cooperative.  Non-toxic appearance. She does not have a sickly appearance. She does not appear ill. No distress.  HENT:  Head: Normocephalic and atraumatic.  Right Ear: Tympanic membrane, external ear and ear canal normal. Tympanic membrane is not erythematous, not retracted and  not bulging.  Left Ear: Tympanic membrane, external ear and ear canal normal. Tympanic membrane is not erythematous, not retracted and not bulging.  Nose: Mucosal edema and rhinorrhea present. Right sinus exhibits no maxillary sinus tenderness and no frontal sinus tenderness. Left sinus exhibits no maxillary sinus tenderness and no frontal sinus tenderness.  Mouth/Throat: Uvula is midline. No posterior oropharyngeal edema or posterior oropharyngeal erythema.  Eyes: Conjunctivae and lids are normal.  Neck: Trachea normal.  Cardiovascular: Normal rate, regular rhythm, S1 normal, S2 normal and normal heart sounds.  Pulmonary/Chest: Effort normal and breath sounds normal. She has no decreased breath sounds. She  has no wheezes. She has no rhonchi. She has no rales.  Lymphadenopathy:    She has no cervical adenopathy.  Neurological: She is alert.  Skin: Skin is warm, dry and intact.  Psychiatric: She has a normal mood and affect. Her speech is normal and behavior is normal.  Nursing note and vitals reviewed.   Assessment and Plan:    Matia was seen today for cough.  Diagnoses and all orders for this visit:  Upper respiratory tract infection, unspecified type  Other orders -     doxycycline (VIBRA-TABS) 100 MG tablet; Take 1 tablet (100 mg total) by mouth 2 (two) times daily. -     benzonatate (TESSALON) 100 MG capsule; Take 1 capsule (100 mg total) by mouth 3 (three) times daily as needed for cough.   No red flags on exam.  Will initiate Doxycycline and Tessalon perles per orders. I also recommended that she purchase some nasal saline spray OTC to help with moisture in her nose to prevent further nose bleeds. Discussed taking medications as prescribed. Reviewed return precautions including worsening fever, SOB, worsening cough or other concerns. Push fluids and rest. I recommend that patient follow-up if symptoms worsen or persist despite treatment x 7-10 days, sooner if needed.  . Reviewed  expectations re: course of current medical issues. . Discussed self-management of symptoms. . Outlined signs and symptoms indicating need for more acute intervention. . Patient verbalized understanding and all questions were answered. . See orders for this visit as documented in the electronic medical record. . Patient received an After-Visit Summary.  CMA or LPN served as scribe during this visit. History, Physical, and Plan performed by medical provider. The above documentation has been reviewed and is accurate and complete.   Jarold Motto, PA-C

## 2017-12-11 NOTE — Patient Instructions (Signed)
It was great to see you!  Use medication as prescribed: start the tessalon perles for cough (prescription that was sent in) and doxycycline oral antibiotic  Consider purchasing some nasal saline spray over the counter to keep your nasal passages moist.  Push fluids and get plenty of rest. Please return if you are not improving as expected, or if you have high fevers (>101.5) or difficulty swallowing or worsening productive cough.  Call clinic with questions.  I hope you start feeling better soon!

## 2017-12-24 ENCOUNTER — Encounter (INDEPENDENT_AMBULATORY_CARE_PROVIDER_SITE_OTHER): Payer: Medicare Other | Admitting: Ophthalmology

## 2017-12-24 DIAGNOSIS — H353231 Exudative age-related macular degeneration, bilateral, with active choroidal neovascularization: Secondary | ICD-10-CM | POA: Diagnosis not present

## 2017-12-24 DIAGNOSIS — H43813 Vitreous degeneration, bilateral: Secondary | ICD-10-CM | POA: Diagnosis not present

## 2017-12-24 DIAGNOSIS — I10 Essential (primary) hypertension: Secondary | ICD-10-CM | POA: Diagnosis not present

## 2017-12-24 DIAGNOSIS — H35033 Hypertensive retinopathy, bilateral: Secondary | ICD-10-CM | POA: Diagnosis not present

## 2018-01-22 ENCOUNTER — Telehealth: Payer: Self-pay | Admitting: Family Medicine

## 2018-01-22 NOTE — Telephone Encounter (Signed)
Please advise 

## 2018-01-22 NOTE — Telephone Encounter (Signed)
I can't answer this question. She will need to discuss with an oral Careers adviser.

## 2018-01-22 NOTE — Telephone Encounter (Signed)
Notified patients nephew that they would need to contact the oral surgeon. He verbalized understanding.

## 2018-01-22 NOTE — Telephone Encounter (Signed)
Copied from CRM [id number]. Topic: Quick Communication - See Telephone Encounter >> Jan 22, 2018  1:09 PM Waymon Amato wrote: Pt niece Driscilla Grammes is calling to see if patient is able to have teeth implants to help hold in her dentures-she is on the hippa amd also so is kevin poindexter her husband -his number Is [phone] or kiya after 400 at [phone]

## 2018-01-29 ENCOUNTER — Encounter: Payer: Self-pay | Admitting: Family Medicine

## 2018-01-29 ENCOUNTER — Ambulatory Visit (INDEPENDENT_AMBULATORY_CARE_PROVIDER_SITE_OTHER): Payer: Medicare Other | Admitting: Family Medicine

## 2018-01-29 VITALS — BP 138/74 | HR 78 | Temp 97.9°F | Ht 62.0 in | Wt 158.0 lb

## 2018-01-29 DIAGNOSIS — N183 Chronic kidney disease, stage 3 unspecified: Secondary | ICD-10-CM

## 2018-01-29 DIAGNOSIS — J0100 Acute maxillary sinusitis, unspecified: Secondary | ICD-10-CM | POA: Diagnosis not present

## 2018-01-29 DIAGNOSIS — E782 Mixed hyperlipidemia: Secondary | ICD-10-CM

## 2018-01-29 DIAGNOSIS — I10 Essential (primary) hypertension: Secondary | ICD-10-CM

## 2018-01-29 LAB — LIPID PANEL
Cholesterol: 151 mg/dL (ref 0–200)
HDL: 52.1 mg/dL (ref >39.00)
LDL Cholesterol: 70 mg/dL (ref 0–99)
NonHDL: 98.83
Total CHOL/HDL Ratio: 3
Triglycerides: 146 mg/dL (ref 0.0–149.0)
VLDL: 29.2 mg/dL (ref 0.0–40.0)

## 2018-01-29 LAB — CBC WITH DIFFERENTIAL/PLATELET
Basophils Absolute: 0 10*3/uL (ref 0.0–0.1)
Basophils Relative: 0.4 % (ref 0.0–3.0)
Eosinophils Absolute: 0.1 10*3/uL (ref 0.0–0.7)
Eosinophils Relative: 2.3 % (ref 0.0–5.0)
HCT: 39.1 % (ref 36.0–46.0)
Hemoglobin: 12.8 g/dL (ref 12.0–15.0)
Lymphocytes Relative: 54.3 % — ABNORMAL HIGH (ref 12.0–46.0)
Lymphs Abs: 3.4 10*3/uL (ref 0.7–4.0)
MCHC: 32.8 g/dL (ref 30.0–36.0)
MCV: 83.6 fl (ref 78.0–100.0)
Monocytes Absolute: 0.3 10*3/uL (ref 0.1–1.0)
Monocytes Relative: 5.5 % (ref 3.0–12.0)
Neutro Abs: 2.3 10*3/uL (ref 1.4–7.7)
Neutrophils Relative %: 37.5 % — ABNORMAL LOW (ref 43.0–77.0)
Platelets: 286 10*3/uL (ref 150.0–400.0)
RBC: 4.67 Mil/uL (ref 3.87–5.11)
RDW: 14.4 % (ref 11.5–15.5)
WBC: 6.2 10*3/uL (ref 4.0–10.5)

## 2018-01-29 LAB — COMPREHENSIVE METABOLIC PANEL
ALT: 15 U/L (ref 0–35)
AST: 13 U/L (ref 0–37)
Albumin: 4 g/dL (ref 3.5–5.2)
Alkaline Phosphatase: 88 U/L (ref 39–117)
BUN: 13 mg/dL (ref 6–23)
CO2: 29 mEq/L (ref 19–32)
Calcium: 9.5 mg/dL (ref 8.4–10.5)
Chloride: 102 mEq/L (ref 96–112)
Creatinine, Ser: 1.43 mg/dL — ABNORMAL HIGH (ref 0.40–1.20)
GFR: 44.79 mL/min — ABNORMAL LOW (ref >60.00)
Glucose, Bld: 116 mg/dL — ABNORMAL HIGH (ref 70–99)
Potassium: 4.5 mEq/L (ref 3.5–5.1)
Sodium: 140 mEq/L (ref 135–145)
Total Bilirubin: 0.6 mg/dL (ref 0.2–1.2)
Total Protein: 7.1 g/dL (ref 6.0–8.3)

## 2018-01-29 LAB — PHOSPHORUS: Phosphorus: 4.4 mg/dL (ref 2.3–4.6)

## 2018-01-29 LAB — MAGNESIUM: Magnesium: 2 mg/dL (ref 1.5–2.5)

## 2018-01-29 MED ORDER — AZITHROMYCIN 250 MG PO TABS: ORAL_TABLET | ORAL | 0 refills | Status: DC

## 2018-01-29 NOTE — Progress Notes (Signed)
Whitney Davidson is a 84 y.o. female is here for follow up.  History of Present Illness:   HPI:   1. CKD (chronic kidney disease) stage 3, GFR 30-59 ml/min (HCC).   Lab Results  Component Value Date   CREATININE 1.34 (H) 10/21/2017   CREATININE 1.24 (H) 04/18/2017   CREATININE 1.34 (H) 10/15/2016    2. Mixed hyperlipidemia.   Is the patient taking medications without problems? [x]   YES  []   NO Does the patient complain of muscle aches?   []   YES  [x]    NO Trying to exercise on a regular basis? []   YES  [x]   NO Compliant with diet? []   YES  [x]   NO   Lipids:    Component Value Date/Time   CHOL 138 09/02/2016 0848   TRIG 86 09/02/2016 0848   HDL 44 09/02/2016 0848   VLDL 17 09/02/2016 0848   CHOLHDL 3.1 09/02/2016 0848     3. Essential hypertension, on Norvasc and ASA.   Review: taking medications as instructed, no medication side effects noted, no TIAs, no chest pain on exertion, no dyspnea on exertion, no swelling of ankles.   Wt Readings from Last 3 Encounters:  01/29/18 158 lb (71.7 kg)  12/11/17 153 lb (69.4 kg)  10/29/17 157 lb 3.2 oz (71.3 kg)   BP Readings from Last 3 Encounters:  01/29/18 138/74  12/11/17 120/60  10/29/17 128/64   Lab Results  Component Value Date   CREATININE 1.34 (H) 10/21/2017      Also, she has additional complaints of sinus pressure, worsening over the past week, without fever or dizziness or cough. Hx of allergic rhinitis.  There are no preventive care reminders to display for this patient.   Depression screen [facility] 2/9 10/29/2017 10/26/2017 10/15/2016  Decreased Interest 0 0 0  Down, Depressed, Hopeless 0 0 0  PHQ - 2 Score 0 0 0  Altered sleeping 0 - -  Tired, decreased energy 1 - -  Change in appetite 0 - -  Feeling bad or failure about yourself  0 - -  Trouble concentrating 0 - -  Moving slowly or fidgety/restless 0 - -  Suicidal thoughts 0 - -  PHQ-9 Score 1 - -  Difficult doing work/chores Not difficult at all - -   PMHx,  SurgHx, SocialHx, FamHx, Medications, and Allergies were reviewed in the Visit Navigator and updated as appropriate.   Patient Active Problem List   Diagnosis Date Noted  . Localized osteoporosis without current pathological fracture, takes calcium and D, refuses medication 07/19/2017  . Overweight (BMI 25.0-29.9) 07/19/2017  . GERD (gastroesophageal reflux disease), on Omeprazole 10/15/2016  . Macular degeneration 10/15/2016  . Vitamin D deficiency 10/15/2016  . Essential hypertension, on Norvasc and ASA 09/01/2016  . Hyperlipidemia, on Pravastatin 09/01/2016  . LBBB (left bundle branch block) 09/01/2016  . CKD (chronic kidney disease) stage 3, GFR 30-59 ml/min (HCC) 09/01/2016   Social History   Tobacco Use  . Smoking status: Never Smoker  . Smokeless tobacco: Never Used  Substance Use Topics  . Alcohol use: No  . Drug use: No   Current Medications and Allergies:   .  acetaminophen (TYLENOL) 325 MG tablet, Take 2 tablets (650 mg total) by mouth every 6 (six) hours as needed for mild pain or moderate pain. OTC, Disp: 30 tablet, Rfl: 0 .  amLODipine (NORVASC) 5 MG tablet, Take 1 tablet (5 mg total) by mouth daily., Disp: 90 tablet, Rfl: 1 .  aspirin EC 81 MG tablet, Take 81 mg by mouth daily., Disp: , Rfl:  .  Cholecalciferol (D3-1000) 1000 units capsule, Take 1,000 Units by mouth daily., Disp: , Rfl:  .  Omega-3 Fatty Acids (FISH OIL) 1000 MG CAPS, Take 1,000 mg by mouth 2 (two) times daily., Disp: , Rfl:  .  omeprazole (PRILOSEC) 40 MG capsule, Take 1 capsule (40 mg total) by mouth daily before breakfast., Disp: 90 capsule, Rfl: 3 .  pravastatin (PRAVACHOL) 20 MG tablet, TAKE 1 TABLET(20 MG) BY MOUTH DAILY, Disp: 90 tablet, Rfl: 2   Allergies  Allergen Reactions  . Lisinopril-Hydrochlorothiazide Swelling    Angioedema  . Penicillins     Has patient had a PCN reaction causing immediate rash, facial/tongue/throat swelling, SOB or lightheadedness with hypotension: Yes Has  patient had a PCN reaction causing severe rash involving mucus membranes or skin necrosis: Unknown Has patient had a PCN reaction that required hospitalization: Unknown Has patient had a PCN reaction occurring within the last 10 years: No If all of the above answers are "NO", then may proceed with Cephalosporin use.   Review of Systems   Pertinent items are noted in the HPI. Otherwise, ROS is negative.  Vitals:   Vitals:   01/29/18 0727  BP: 138/74  Pulse: 78  Temp: 97.9 F (36.6 C)  TempSrc: Oral  SpO2: 97%  Weight: 158 lb (71.7 kg)  Height: 5\' 2"  (1.575 m)     Body mass index is 28.9 kg/m.  Physical Exam:   Physical Exam  Constitutional: She appears well-nourished.  HENT:  Head: Normocephalic and atraumatic.  Nose: Right sinus exhibits frontal sinus tenderness. Left sinus exhibits frontal sinus tenderness.  Eyes: Pupils are equal, round, and reactive to light. EOM are normal.  Neck: Normal range of motion. Neck supple.  Cardiovascular: Normal rate, regular rhythm, normal heart sounds and intact distal pulses.  Pulmonary/Chest: Effort normal.  Abdominal: Soft.  Skin: Skin is warm.  Psychiatric: She has a normal mood and affect. Her behavior is normal.  Nursing note and vitals reviewed.  Assessment and Plan:   Diagnoses and all orders for this visit:  CKD (chronic kidney disease) stage 3, GFR 30-59 ml/min (HCC) -     CBC with Differential/Platelet -     Comprehensive metabolic panel -     Magnesium -     Phosphorus  Mixed hyperlipidemia -     Lipid panel  Essential hypertension, on Norvasc and ASA  Subacute maxillary sinusitis -     azithromycin (ZITHROMAX) 250 MG tablet; 2 po x 1 day, then 1 po daily each day until gone   . Reviewed expectations re: course of current medical issues. . Discussed self-management of symptoms. . Outlined signs and symptoms indicating need for more acute intervention. . Patient verbalized understanding and all questions were  answered. Marland Kitchen Health Maintenance issues including appropriate healthy diet, exercise, and smoking avoidance were discussed with patient. . See orders for this visit as documented in the electronic medical record. . Patient received an After Visit Summary.  Helane Rima, DO [place], [facility] 01/29/2018

## 2018-03-17 ENCOUNTER — Other Ambulatory Visit: Payer: Self-pay | Admitting: Family Medicine

## 2018-03-17 DIAGNOSIS — I10 Essential (primary) hypertension: Secondary | ICD-10-CM

## 2018-03-18 ENCOUNTER — Encounter (INDEPENDENT_AMBULATORY_CARE_PROVIDER_SITE_OTHER): Payer: Medicare Other | Admitting: Ophthalmology

## 2018-03-18 DIAGNOSIS — H43813 Vitreous degeneration, bilateral: Secondary | ICD-10-CM

## 2018-03-18 DIAGNOSIS — H35033 Hypertensive retinopathy, bilateral: Secondary | ICD-10-CM

## 2018-03-18 DIAGNOSIS — I10 Essential (primary) hypertension: Secondary | ICD-10-CM | POA: Diagnosis not present

## 2018-03-18 DIAGNOSIS — H353231 Exudative age-related macular degeneration, bilateral, with active choroidal neovascularization: Secondary | ICD-10-CM | POA: Diagnosis not present

## 2018-04-29 ENCOUNTER — Other Ambulatory Visit: Payer: Self-pay | Admitting: Family Medicine

## 2018-04-29 DIAGNOSIS — K219 Gastro-esophageal reflux disease without esophagitis: Secondary | ICD-10-CM

## 2018-04-30 NOTE — Progress Notes (Signed)
Whitney Davidson is a 85 y.o. female is here for follow up.  History of Present Illness:   Barnie Mort, CMA acting as scribe for Dr. Helane Rima.   HPI: At last visit patient was treated for sinus infection.   CKD (chronic kidney disease) stage 3, GFR 30-59 ml/min (HCC) Genito-Urinary ROS: no dysuria, trouble voiding, or hematuria.  Mixed hyperlipidemia Review: taking medications as instructed, no medication side effects noted, no TIAs, no chest pain on exertion, no dyspnea on exertion, no swelling of ankles. Smoker: No.   BP Readings from Last 3 Encounters:  05/02/18 (!) 148/72  01/29/18 138/74  12/11/17 120/60   Lab Results  Component Value Date   CREATININE 1.43 (H) 01/29/2018   CREATININE 1.34 (H) 10/21/2017   CREATININE 1.24 (H) 04/18/2017     Essential hypertension, on Norvasc and ASA Review: taking medications as instructed, no medication side effects noted, no TIAs, no chest pain on exertion, no dyspnea on exertion, no swelling of ankles. Smoker: No.   BP Readings from Last 3 Encounters:  05/02/18 (!) 148/72  01/29/18 138/74  12/11/17 120/60   Lab Results  Component Value Date   CREATININE 1.43 (H) 01/29/2018   CREATININE 1.34 (H) 10/21/2017   CREATININE 1.24 (H) 04/18/2017     There are no preventive care reminders to display for this patient.   Depression screen [facility] 2/9 10/29/2017 10/26/2017 10/15/2016  Decreased Interest 0 0 0  Down, Depressed, Hopeless 0 0 0  PHQ - 2 Score 0 0 0  Altered sleeping 0 - -  Tired, decreased energy 1 - -  Change in appetite 0 - -  Feeling bad or failure about yourself  0 - -  Trouble concentrating 0 - -  Moving slowly or fidgety/restless 0 - -  Suicidal thoughts 0 - -  PHQ-9 Score 1 - -  Difficult doing work/chores Not difficult at all - -   PMHx, SurgHx, SocialHx, FamHx, Medications, and Allergies were reviewed in the Visit Navigator and updated as appropriate.   Patient Active Problem List   Diagnosis Date Noted    . Localized osteoporosis without current pathological fracture, takes calcium and D, refuses medication 07/19/2017  . Overweight (BMI 25.0-29.9) 07/19/2017  . GERD (gastroesophageal reflux disease), on Omeprazole 10/15/2016  . Macular degeneration 10/15/2016  . Vitamin D deficiency 10/15/2016  . Essential hypertension, on Norvasc and ASA 09/01/2016  . Hyperlipidemia, on Pravastatin 09/01/2016  . LBBB (left bundle branch block) 09/01/2016  . CKD (chronic kidney disease) stage 3, GFR 30-59 ml/min (HCC) 09/01/2016   Social History   Tobacco Use  . Smoking status: Never Smoker  . Smokeless tobacco: Never Used  Substance Use Topics  . Alcohol use: No  . Drug use: No   Current Medications and Allergies:   .  acetaminophen (TYLENOL) 325 MG tablet, Take 2 tablets (650 mg total) by mouth every 6 (six) hours as needed for mild pain or moderate pain. OTC, Disp: 30 tablet, Rfl: 0 .  amLODipine (NORVASC) 5 MG tablet, Take 1 tablet (5 mg total) by mouth daily., Disp: 90 tablet, Rfl: 1 .  aspirin EC 81 MG tablet, Take 81 mg by mouth daily., Disp: , Rfl:  .  Cholecalciferol (D3-1000) 1000 units capsule, Take 1,000 Units by mouth daily., Disp: , Rfl:  .  Omega-3 Fatty Acids (FISH OIL) 1000 MG CAPS, Take 1,000 mg by mouth 2 (two) times daily., Disp: , Rfl:  .  omeprazole (PRILOSEC) 40 MG capsule, TAKE 1  CAPSULE(40 MG) BY MOUTH DAILY BEFORE BREAKFAST, Disp: 90 capsule, Rfl: 3 .  pravastatin (PRAVACHOL) 20 MG tablet, TAKE 1 TABLET(20 MG) BY MOUTH DAILY, Disp: 90 tablet, Rfl: 2 .  trimethoprim-polymyxin b (POLYTRIM) ophthalmic solution, INT 1 GTT INTO OS QID 2 DAYS AFTER EACH MONTHLY INJECTION, Disp: , Rfl: 12   Allergies  Allergen Reactions  . Lisinopril-Hydrochlorothiazide Swelling    Angioedema  . Penicillins     Has patient had a PCN reaction causing immediate rash, facial/tongue/throat swelling, SOB or lightheadedness with hypotension: Yes Has patient had a PCN reaction causing severe rash  involving mucus membranes or skin necrosis: Unknown Has patient had a PCN reaction that required hospitalization: Unknown Has patient had a PCN reaction occurring within the last 10 years: No If all of the above answers are "NO", then may proceed with Cephalosporin use.   Review of Systems   Pertinent items are noted in the HPI. Otherwise, a complete ROS is negative.  Vitals:   Vitals:   05/02/18 0738  BP: (!) 148/72  Pulse: 86  Temp: 98.1 F (36.7 C)  TempSrc: Oral  SpO2: 95%  Weight: 161 lb 6.4 oz (73.2 kg)  Height: 5\' 2"  (1.575 m)     Body mass index is 29.52 kg/m.  Physical Exam:   Physical Exam Vitals signs and nursing note reviewed.  HENT:     Head: Normocephalic and atraumatic.  Eyes:     Pupils: Pupils are equal, round, and reactive to light.  Neck:     Musculoskeletal: Normal range of motion and neck supple.  Cardiovascular:     Rate and Rhythm: Normal rate and regular rhythm.     Heart sounds: Normal heart sounds.  Pulmonary:     Effort: Pulmonary effort is normal.  Abdominal:     Palpations: Abdomen is soft.  Skin:    General: Skin is warm.  Psychiatric:        Behavior: Behavior normal.    Assessment and Plan:   Whitney Davidson was seen today for follow-up.  Diagnoses and all orders for this visit:  Essential hypertension, on Norvasc and ASA -     amLODipine (NORVASC) 5 MG tablet; Take 1 tablet (5 mg total) by mouth daily.  Mixed hyperlipidemia -     Comprehensive metabolic panel  CKD (chronic kidney disease) stage 3, GFR 30-59 ml/min (HCC) -     CBC with Differential/Platelet -     Comprehensive metabolic panel -     Magnesium -     Phosphorus  Gastroesophageal reflux disease without esophagitis Comments: Still symptomatic if she misses a dose. Continue.  Localized osteoporosis without current pathological fracture, takes calcium and D, refuses medication Comments: See AVS for [facility] information discussed.    . Orders and follow up  as documented in EpicCare, reviewed diet, exercise and weight control, cardiovascular risk and specific lipid/LDL goals reviewed, reviewed medications and side effects in detail.  . Reviewed expectations re: course of current medical issues. . Outlined signs and symptoms indicating need for more acute intervention. . Patient verbalized understanding and all questions were answered. . Patient received an After Visit Summary.  CMA served as Neurosurgeon during this visit. History, Physical, and Plan performed by medical provider. The above documentation has been reviewed and is accurate and complete. Helane Rima, D.O.  Helane Rima, DO [place], [facility] 05/02/2018

## 2018-05-01 NOTE — Patient Instructions (Addendum)
[facility]  After a diagnosis of osteoporosis or osteopenia, reduce your risk of bone fractures with [facility].  You'll work one-on-one with a physical therapist trained in the [place] Method, a 12-step program shown to effectively prevent, manage or improve symptoms of osteopenia, osteoporosis and postural problems.  You will begin with a comprehensive assessment. Your therapist will assess your posture, height, range of motion, strength, balance and more. If you've had a bone density test, such as a DEXA scan, bring your test results. Your therapist will ask about your goals, and then you'll work together to create a personalized treatment plan that fits your needs. You can expect to see your physical therapist for 45-minute sessions once or twice a week for four to six weeks. You'll learn about your condition, body mechanics and safe movement. You'll also get a personalized home exercise program.  When you follow your physical therapist's recommendations and home exercise program, you can expect benefits such as: Marland Kitchen A stronger core. . Better balance. . Less pain. . Increased height due to improved upright posture. . Improved confidence, outlook and quality of life.  High Triglycerides Eating Plan Triglycerides are a type of fat in the blood. High levels of triglycerides can increase your risk of heart disease and stroke. If your triglyceride levels are high, choosing the right foods can help lower your triglycerides and keep your heart healthy. Work with your health care provider or a diet and nutrition specialist (dietitian) to develop an eating plan that is right for you. What are tips for following this plan? General guidelines   Lose weight, if you are overweight. For most people, losing 5-10 lbs (2-5 kg) helps lower triglyceride levels. A weight-loss plan may include. ? 30 minutes of exercise at least 5 days a week. ? Reducing the amount of  calories, sugar, and fat you eat.  Eat a wide variety of fresh fruits, vegetables, and whole grains. These foods are high in fiber.  Eat foods that contain healthy fats, such as fatty fish, nuts, seeds, and olive oil.  Avoid foods that are high in added sugar, added salt (sodium), saturated fat, and trans fat.  Avoid low-fiber, refined carbohydrates such as white bread, crackers, noodles, and white rice.  Avoid foods with partially hydrogenated oils (trans fats), such as fried foods or stick margarine.  Limit alcohol intake to no more than 1 drink a day for nonpregnant women and 2 drinks a day for men. One drink equals 12 oz of beer, 5 oz of wine, or 1 oz of hard liquor. Your health care provider may recommend that you drink less depending on your overall health. Reading food labels  Check food labels for the amount of saturated fat. Choose foods with no or very little saturated fat.  Check food labels for the amount of trans fat. Choose foods with no trans fat.  Check food labels for the amount of cholesterol. Choose foods low in cholesterol. Ask your dietitian how much cholesterol you should have each day.  Check food labels for the amount of sodium. Choose foods with less than 140 milligrams (mg) per serving. Shopping  Buy dairy products labeled as nonfat (skim) or low-fat (1%).  Avoid buying processed or prepackaged foods. These are often high in added sugar, sodium, and fat. Cooking  Choose healthy fats when cooking, such as olive oil or canola oil.  Cook foods using lower fat methods, such as baking, broiling, boiling, or grilling.  Make your  own sauces, dressings, and marinades when possible, instead of buying them. Store-bought sauces, dressings, and marinades are often high in sodium and sugar. Meal planning  Eat more home-cooked food and less restaurant, buffet, and fast food.  Eat fatty fish at least 2 times each week. Examples of fatty fish include salmon, trout,  mackerel, tuna, and herring.  If you eat whole eggs, do not eat more than 3 egg yolks per week. What foods are recommended? The items listed may not be a complete list. Talk with your dietitian about what dietary choices are best for you. Grains Whole wheat or whole grain breads, crackers, cereals, and pasta. Unsweetened oatmeal. Bulgur. Barley. Quinoa. Brown rice. Whole wheat flour tortillas. Vegetables Fresh or frozen vegetables. Low-sodium canned vegetables. Fruits All fresh, canned (in natural juice), or frozen fruits. Meats and other protein foods Skinless chicken or [place]. Ground chicken or [place]. Lean cuts of pork, trimmed of fat. Fish and seafood, especially salmon, trout, and herring. Egg whites. Dried beans, peas, or lentils. Unsalted nuts or seeds. Unsalted canned beans. Natural peanut or almond butter. Dairy Low-fat dairy products. Skim or low-fat (1%) milk. Reduced fat (2%) and low-sodium cheese. Low-fat ricotta cheese. Low-fat cottage cheese. Plain, low-fat yogurt. Fats and oils Tub margarine without trans fats. Light or reduced-fat mayonnaise. Light or reduced-fat salad dressings. Avocado. Safflower, olive, sunflower, soybean, and canola oils. What foods are not recommended? The items listed may not be a complete list. Talk with your dietitian about what dietary choices are best for you. Grains White bread. White (regular) pasta. White rice. Cornbread. Bagels. Pastries. Crackers that contain trans fat. Vegetables Creamed or fried vegetables. Vegetables in a cheese sauce. Fruits Sweetened dried fruit. Canned fruit in syrup. Fruit juice. Meats and other protein foods Fatty cuts of meat. Ribs. Chicken wings. Tomasa Blase. Sausage. Bologna. Salami. Chitterlings. Fatback. Hot dogs. Bratwurst. Packaged lunch meats. Dairy Whole or reduced-fat (2%) milk. Half-and-half. Cream cheese. Full-fat or sweetened yogurt. Full-fat cheese. Nondairy creamers. Whipped toppings. Processed cheese or  cheese spreads. Cheese curds. Beverages Alcohol. Sweetened drinks, such as soda, lemonade, fruit drinks, or punches. Fats and oils Butter. Stick margarine. Lard. Shortening. Ghee. Bacon fat. Tropical oils, such as coconut, palm kernel, or palm oils. Sweets and desserts Corn syrup. Sugars. Honey. Molasses. Candy. Jam and jelly. Syrup. Sweetened cereals. Cookies. Pies. Cakes. Donuts. Muffins. Ice cream. Condiments Store-bought sauces, dressings, and marinades that are high in sugar, such as ketchup and barbecue sauce. Summary  High levels of triglycerides can increase the risk of heart disease and stroke. Choosing the right foods can help lower your triglycerides.  Eat plenty of fresh fruits, vegetables, and whole grains. Choose low-fat dairy and lean meats. Eat fatty fish at least twice a week.  Avoid processed and prepackaged foods with added sugar, sodium, saturated fat, and trans fat.  If you need suggestions or have questions about what types of food are good for you, talk with your health care provider or a dietitian. This information is not intended to replace advice given to you by your health care provider. Make sure you discuss any questions you have with your health care provider. Document Released: 01/12/2004 Document Revised: 05/29/2016 Document Reviewed: 05/29/2016 Elsevier Interactive Patient Education  2019 [employer].

## 2018-05-02 ENCOUNTER — Encounter: Payer: Self-pay | Admitting: Family Medicine

## 2018-05-02 ENCOUNTER — Ambulatory Visit (INDEPENDENT_AMBULATORY_CARE_PROVIDER_SITE_OTHER): Payer: Medicare Other | Admitting: Family Medicine

## 2018-05-02 VITALS — BP 148/72 | HR 86 | Temp 98.1°F | Ht 62.0 in | Wt 161.4 lb

## 2018-05-02 DIAGNOSIS — M816 Localized osteoporosis [Lequesne]: Secondary | ICD-10-CM

## 2018-05-02 DIAGNOSIS — N183 Chronic kidney disease, stage 3 unspecified: Secondary | ICD-10-CM

## 2018-05-02 DIAGNOSIS — E782 Mixed hyperlipidemia: Secondary | ICD-10-CM | POA: Diagnosis not present

## 2018-05-02 DIAGNOSIS — I10 Essential (primary) hypertension: Secondary | ICD-10-CM

## 2018-05-02 DIAGNOSIS — K219 Gastro-esophageal reflux disease without esophagitis: Secondary | ICD-10-CM | POA: Diagnosis not present

## 2018-05-02 LAB — COMPREHENSIVE METABOLIC PANEL
ALT: 14 U/L (ref 0–35)
AST: 14 U/L (ref 0–37)
Albumin: 3.8 g/dL (ref 3.5–5.2)
Alkaline Phosphatase: 81 U/L (ref 39–117)
BUN: 14 mg/dL (ref 6–23)
CO2: 29 mEq/L (ref 19–32)
Calcium: 9.2 mg/dL (ref 8.4–10.5)
Chloride: 104 mEq/L (ref 96–112)
Creatinine, Ser: 1.27 mg/dL — ABNORMAL HIGH (ref 0.40–1.20)
GFR: 48.3 mL/min — ABNORMAL LOW (ref >60.00)
Glucose, Bld: 116 mg/dL — ABNORMAL HIGH (ref 70–99)
Potassium: 4 mEq/L (ref 3.5–5.1)
Sodium: 139 mEq/L (ref 135–145)
Total Bilirubin: 0.8 mg/dL (ref 0.2–1.2)
Total Protein: 6.8 g/dL (ref 6.0–8.3)

## 2018-05-02 LAB — CBC WITH DIFFERENTIAL/PLATELET
Basophils Absolute: 0 10*3/uL (ref 0.0–0.1)
Basophils Relative: 0.4 % (ref 0.0–3.0)
Eosinophils Absolute: 0.2 10*3/uL (ref 0.0–0.7)
Eosinophils Relative: 2.6 % (ref 0.0–5.0)
HCT: 37.9 % (ref 36.0–46.0)
Hemoglobin: 12.4 g/dL (ref 12.0–15.0)
Lymphocytes Relative: 48.9 % — ABNORMAL HIGH (ref 12.0–46.0)
Lymphs Abs: 3.1 10*3/uL (ref 0.7–4.0)
MCHC: 32.7 g/dL (ref 30.0–36.0)
MCV: 83.5 fl (ref 78.0–100.0)
Monocytes Absolute: 0.4 10*3/uL (ref 0.1–1.0)
Monocytes Relative: 6.1 % (ref 3.0–12.0)
Neutro Abs: 2.6 10*3/uL (ref 1.4–7.7)
Neutrophils Relative %: 42 % — ABNORMAL LOW (ref 43.0–77.0)
Platelets: 282 10*3/uL (ref 150.0–400.0)
RBC: 4.54 Mil/uL (ref 3.87–5.11)
RDW: 14.1 % (ref 11.5–15.5)
WBC: 6.3 10*3/uL (ref 4.0–10.5)

## 2018-05-02 LAB — MAGNESIUM: Magnesium: 1.9 mg/dL (ref 1.5–2.5)

## 2018-05-02 LAB — PHOSPHORUS: Phosphorus: 3.6 mg/dL (ref 2.3–4.6)

## 2018-05-02 MED ORDER — AMLODIPINE BESYLATE 5 MG PO TABS: 5.0000 mg | ORAL_TABLET | Freq: Every day | ORAL | 1 refills | Status: DC

## 2018-06-10 ENCOUNTER — Encounter (INDEPENDENT_AMBULATORY_CARE_PROVIDER_SITE_OTHER): Payer: Medicare Other | Admitting: Ophthalmology

## 2018-06-10 DIAGNOSIS — I10 Essential (primary) hypertension: Secondary | ICD-10-CM | POA: Diagnosis not present

## 2018-06-10 DIAGNOSIS — H35033 Hypertensive retinopathy, bilateral: Secondary | ICD-10-CM

## 2018-06-10 DIAGNOSIS — H43813 Vitreous degeneration, bilateral: Secondary | ICD-10-CM | POA: Diagnosis not present

## 2018-06-10 DIAGNOSIS — H353231 Exudative age-related macular degeneration, bilateral, with active choroidal neovascularization: Secondary | ICD-10-CM

## 2018-08-01 ENCOUNTER — Ambulatory Visit: Payer: Medicare Other | Admitting: Family Medicine

## 2018-08-03 NOTE — Progress Notes (Signed)
Virtual Visit via Video   Due to the COVID-19 pandemic, this visit was completed with telemedicine (audio/video) technology to reduce patient and provider exposure as well as to preserve personal protective equipment.   I connected with Whitney Davidson on 08/04/18 at  7:40 AM EDT by a video enabled telemedicine application and verified that I am speaking with the correct person using two identifiers. The patient did not have access to video technology/had technical difficulties with video requiring transitioning to audio format only (telephone).  All issues noted in this document were discussed and addressed.  No physical exam could be performed with this format.    Location patient: Home Location provider: [facility], Office Persons participating in the virtual visit: Meeka Manna, Gose, DO Barnie Mort, CMA acting as scribe for Dr. Helane Rima.   I discussed the limitations of evaluation and management by telemedicine and the availability of in person appointments. The patient expressed understanding and agreed to proceed.  Care Team   Patient Care Team: Helane Rima, DO as PCP - General (Family Medicine)  Subjective:   HPI: Patient is interested in doing to PT for strength training and exercises for osteoporosis. She has not had any falls. She has had increased allergies. Her main symptoms are with eyes. She does have injections in eyes every three months.    She was able to do push ups over the weekend to show her family that she could do them. She wanted to ride a bike but they would not let her get on it.   1. Essential hypertension, on Norvasc and ASA.   Review: taking medications as instructed, no medication side effects noted, no TIAs, no chest pain on exertion, no dyspnea on exertion, no swelling of ankles. Smoker: No.   BP Readings from Last 3 Encounters:  05/02/18 (!) 148/72  01/29/18 138/74  12/11/17 120/60   Lab Results  Component Value Date   CREATININE  1.27 (H) 05/02/2018   CREATININE 1.43 (H) 01/29/2018   CREATININE 1.34 (H) 10/21/2017      2. CKD (chronic kidney disease) stage 3, GFR 30-59 ml/min (HCC).   Lab Results  Component Value Date   CREATININE 1.27 (H) 05/02/2018    3. Mixed hyperlipidemia.   Is the patient taking medications without problems? Yes. Does the patient complain of muscle aches?  No. Trying to exercise on a regular basis? Yes. Compliant with diet? Yes.  Lab Results  Component Value Date   CHOL 151 01/29/2018   HDL 52.10 01/29/2018   LDLCALC 70 01/29/2018   TRIG 146.0 01/29/2018   CHOLHDL 3 01/29/2018   Lab Results  Component Value Date   ALT 14 05/02/2018   AST 14 05/02/2018   ALKPHOS 81 05/02/2018   BILITOT 0.8 05/02/2018       Reviewed all precautions and expectations with prevention of Covid-19. She is staying at home. Keeping normal activities at home.   Review of Systems  Constitutional: Negative for chills and fever.  HENT: Negative for hearing loss and tinnitus.   Eyes: Negative for blurred vision and double vision.  Respiratory: Negative for cough.   Cardiovascular: Negative for chest pain and palpitations.  Gastrointestinal: Negative for vomiting.  Genitourinary: Negative for dysuria and urgency.  Musculoskeletal: Negative for neck pain.  Skin: Negative for rash.  Neurological: Negative for speech change and headaches.  Endo/Heme/Allergies: Does not bruise/bleed easily.  Psychiatric/Behavioral: Negative for substance abuse.    Patient Active Problem List   Diagnosis Date Noted  .  Localized osteoporosis without current pathological fracture, takes calcium and D, refuses medication 07/19/2017  . Overweight (BMI 25.0-29.9) 07/19/2017  . GERD (gastroesophageal reflux disease), on Omeprazole 10/15/2016  . Macular degeneration 10/15/2016  . Vitamin D deficiency 10/15/2016  . Essential hypertension, on Norvasc and ASA 09/01/2016  . Hyperlipidemia, on Pravastatin 09/01/2016  . LBBB  (left bundle branch block) 09/01/2016  . CKD (chronic kidney disease) stage 3, GFR 30-59 ml/min (HCC) 09/01/2016    Social History   Tobacco Use  . Smoking status: Never Smoker  . Smokeless tobacco: Never Used  Substance Use Topics  . Alcohol use: No    Current Outpatient Medications:  .  acetaminophen (TYLENOL) 325 MG tablet, Take 2 tablets (650 mg total) by mouth every 6 (six) hours as needed for mild pain or moderate pain. OTC, Disp: 30 tablet, Rfl: 0 .  amLODipine (NORVASC) 5 MG tablet, Take 1 tablet (5 mg total) by mouth daily., Disp: 90 tablet, Rfl: 1 .  aspirin EC 81 MG tablet, Take 81 mg by mouth daily., Disp: , Rfl:  .  Cholecalciferol (D3-1000) 1000 units capsule, Take 1,000 Units by mouth daily., Disp: , Rfl:  .  Omega-3 Fatty Acids (FISH OIL) 1000 MG CAPS, Take 1,000 mg by mouth 2 (two) times daily., Disp: , Rfl:  .  omeprazole (PRILOSEC) 40 MG capsule, TAKE 1 CAPSULE(40 MG) BY MOUTH DAILY BEFORE BREAKFAST, Disp: 90 capsule, Rfl: 3 .  pravastatin (PRAVACHOL) 20 MG tablet, TAKE 1 TABLET(20 MG) BY MOUTH DAILY, Disp: 90 tablet, Rfl: 2 .  trimethoprim-polymyxin b (POLYTRIM) ophthalmic solution, INT 1 GTT INTO OS QID 2 DAYS AFTER EACH MONTHLY INJECTION, Disp: , Rfl: 12  Allergies  Allergen Reactions  . Lisinopril-Hydrochlorothiazide Swelling    Angioedema  . Penicillins     Has patient had a PCN reaction causing immediate rash, facial/tongue/throat swelling, SOB or lightheadedness with hypotension: Yes Has patient had a PCN reaction causing severe rash involving mucus membranes or skin necrosis: Unknown Has patient had a PCN reaction that required hospitalization: Unknown Has patient had a PCN reaction occurring within the last 10 years: No If all of the above answers are "NO", then may proceed with Cephalosporin use.   Objective:   VITALS: Per patient if applicable, see vitals. GENERAL: Alert and in no acute distress. CARDIOPULMONARY: No increased WOB. Speaking in clear  sentences.  PSYCH: Pleasant and cooperative. Speech normal rate and rhythm. Affect is appropriate. Insight and judgement are appropriate. Attention is focused, linear, and appropriate.  NEURO: Oriented as arrived to appointment on time with no prompting.   Depression screen [facility] 2/9 10/29/2017 10/26/2017 10/15/2016  Decreased Interest 0 0 0  Down, Depressed, Hopeless 0 0 0  PHQ - 2 Score 0 0 0  Altered sleeping 0 - -  Tired, decreased energy 1 - -  Change in appetite 0 - -  Feeling bad or failure about yourself  0 - -  Trouble concentrating 0 - -  Moving slowly or fidgety/restless 0 - -  Suicidal thoughts 0 - -  PHQ-9 Score 1 - -  Difficult doing work/chores Not difficult at all - -   Assessment and Plan:   Belkys was seen today for follow-up. Well controlled.  No signs of complications, medication side effects, or red flags.  Continue current regimen.    Diagnoses and all orders for this visit:  Essential hypertension, on Norvasc and ASA  CKD (chronic kidney disease) stage 3, GFR 30-59 ml/min (HCC)  Mixed hyperlipidemia  Localized osteoporosis without current pathological fracture, takes calcium and D, refuses medication Comments: Interested in PT. Orders: -     Ambulatory referral to Physical Therapy  . COVID-19 Education:The signs and symptoms of COVID-19 were discussed with the patient and how to seek care for testing if needed. The importance of social distancing was discussed today. . Reviewed expectations re: course of current medical issues. . Discussed self-management of symptoms. . Outlined signs and symptoms indicating need for more acute intervention. . Patient verbalized understanding and all questions were answered. Marland Kitchen. Health Maintenance issues including appropriate healthy diet, exercise, and smoking avoidance were discussed with patient. . See orders for this visit as documented in the electronic medical record.  Helane RimaErica Anjali Manzella, DO 08/04/2018  Records requested if  needed. Time spent: 25 minutes, of which >50% was spent in obtaining information about her symptoms, reviewing her previous labs, evaluations, and treatments, counseling her about her condition (please see the discussed topics above), and developing a plan to further investigate it; she had a number of questions which I addressed.

## 2018-08-04 ENCOUNTER — Ambulatory Visit (INDEPENDENT_AMBULATORY_CARE_PROVIDER_SITE_OTHER): Payer: Medicare Other | Admitting: Family Medicine

## 2018-08-04 ENCOUNTER — Encounter: Payer: Self-pay | Admitting: Family Medicine

## 2018-08-04 ENCOUNTER — Other Ambulatory Visit: Payer: Self-pay

## 2018-08-04 VITALS — Temp 97.5°F | Ht 62.0 in | Wt 161.0 lb

## 2018-08-04 DIAGNOSIS — M816 Localized osteoporosis [Lequesne]: Secondary | ICD-10-CM

## 2018-08-04 DIAGNOSIS — E782 Mixed hyperlipidemia: Secondary | ICD-10-CM

## 2018-08-04 DIAGNOSIS — N183 Chronic kidney disease, stage 3 unspecified: Secondary | ICD-10-CM

## 2018-08-04 DIAGNOSIS — I10 Essential (primary) hypertension: Secondary | ICD-10-CM | POA: Diagnosis not present

## 2018-08-12 ENCOUNTER — Ambulatory Visit: Payer: Medicare Other | Admitting: Physical Therapy

## 2018-09-02 ENCOUNTER — Other Ambulatory Visit: Payer: Self-pay

## 2018-09-02 ENCOUNTER — Encounter (INDEPENDENT_AMBULATORY_CARE_PROVIDER_SITE_OTHER): Payer: Medicare Other | Admitting: Ophthalmology

## 2018-09-02 DIAGNOSIS — I10 Essential (primary) hypertension: Secondary | ICD-10-CM | POA: Diagnosis not present

## 2018-09-02 DIAGNOSIS — H353231 Exudative age-related macular degeneration, bilateral, with active choroidal neovascularization: Secondary | ICD-10-CM

## 2018-09-02 DIAGNOSIS — H43813 Vitreous degeneration, bilateral: Secondary | ICD-10-CM | POA: Diagnosis not present

## 2018-09-09 ENCOUNTER — Other Ambulatory Visit: Payer: Self-pay | Admitting: Family Medicine

## 2018-09-09 DIAGNOSIS — I10 Essential (primary) hypertension: Secondary | ICD-10-CM

## 2018-11-06 ENCOUNTER — Ambulatory Visit: Payer: Medicare Other

## 2018-12-02 ENCOUNTER — Other Ambulatory Visit: Payer: Self-pay

## 2018-12-02 ENCOUNTER — Encounter (INDEPENDENT_AMBULATORY_CARE_PROVIDER_SITE_OTHER): Payer: Medicare Other | Admitting: Ophthalmology

## 2018-12-02 DIAGNOSIS — I10 Essential (primary) hypertension: Secondary | ICD-10-CM

## 2018-12-02 DIAGNOSIS — H43813 Vitreous degeneration, bilateral: Secondary | ICD-10-CM

## 2018-12-02 DIAGNOSIS — H35033 Hypertensive retinopathy, bilateral: Secondary | ICD-10-CM

## 2018-12-02 DIAGNOSIS — H353231 Exudative age-related macular degeneration, bilateral, with active choroidal neovascularization: Secondary | ICD-10-CM | POA: Diagnosis not present

## 2019-02-27 ENCOUNTER — Other Ambulatory Visit: Payer: Self-pay

## 2019-03-03 ENCOUNTER — Other Ambulatory Visit: Payer: Self-pay

## 2019-03-03 ENCOUNTER — Encounter (INDEPENDENT_AMBULATORY_CARE_PROVIDER_SITE_OTHER): Payer: Medicare Other | Admitting: Ophthalmology

## 2019-03-03 ENCOUNTER — Other Ambulatory Visit: Payer: Self-pay | Admitting: Family Medicine

## 2019-03-03 DIAGNOSIS — H353231 Exudative age-related macular degeneration, bilateral, with active choroidal neovascularization: Secondary | ICD-10-CM | POA: Diagnosis not present

## 2019-03-03 DIAGNOSIS — I10 Essential (primary) hypertension: Secondary | ICD-10-CM

## 2019-03-03 DIAGNOSIS — H43813 Vitreous degeneration, bilateral: Secondary | ICD-10-CM | POA: Diagnosis not present

## 2019-03-03 DIAGNOSIS — H35033 Hypertensive retinopathy, bilateral: Secondary | ICD-10-CM

## 2019-03-03 DIAGNOSIS — K219 Gastro-esophageal reflux disease without esophagitis: Secondary | ICD-10-CM

## 2019-03-03 NOTE — Telephone Encounter (Signed)
Please call for appointment with new PCP. Let me know when made so that I can send refill.

## 2019-03-03 NOTE — Telephone Encounter (Signed)
Patient is scheduled to [facility] to Dr. Rogers Blocker on 05/11/19. Thank you!

## 2019-03-21 ENCOUNTER — Encounter: Payer: Self-pay | Admitting: Family Medicine

## 2019-03-21 ENCOUNTER — Ambulatory Visit (INDEPENDENT_AMBULATORY_CARE_PROVIDER_SITE_OTHER): Payer: Medicare Other | Admitting: Family Medicine

## 2019-03-21 DIAGNOSIS — B354 Tinea corporis: Secondary | ICD-10-CM

## 2019-03-21 MED ORDER — CLOTRIMAZOLE 1 % EX OINT: TOPICAL_OINTMENT | CUTANEOUS | 0 refills | Status: DC

## 2019-03-21 NOTE — Progress Notes (Signed)
[facility] [address], [address] [place], [place] [address] [phone]  Date:  03/21/2019   Name:  Whitney Davidson   DOB:  [birth year]   MRN:  [id number]  PCP:  Helane Rima, DO    Chief Complaint: No chief complaint on file.   History of Present Illness:  Whitney Davidson is a 85 y.o. very pleasant female patient who presents with the following:  Pt with history of HTN, hyperlipidemia,  Osteoporosis, vit D def, CKD Virtual visit today with concern of a rash on her chest  Pt ID confirmed with 2 factors, she gives consent for virtual visit today Patient is at home, I am also at my home.  The patient, her niece, and myself are on FaceTime video today  She notes an approximately dime sized irritated spot in the middle of her chest- there for about a week No other rashes noted anywhere else on her body She is otherwise feeling well-no systemic symptoms, no fever or chills, the rash is not painful or especially irritated, perhaps a bit itchy No one else has a similar rash They have tried some neosporin but this did not really help    Patient Active Problem List   Diagnosis Date Noted  . Localized osteoporosis without current pathological fracture, takes calcium and D, refuses medication 07/19/2017  . Overweight (BMI 25.0-29.9) 07/19/2017  . GERD (gastroesophageal reflux disease), on Omeprazole 10/15/2016  . Macular degeneration 10/15/2016  . Vitamin D deficiency 10/15/2016  . Essential hypertension, on Norvasc and ASA 09/01/2016  . Hyperlipidemia, on Pravastatin 09/01/2016  . LBBB (left bundle branch block) 09/01/2016  . CKD (chronic kidney disease) stage 3, GFR 30-59 ml/min (HCC) 09/01/2016    Past Medical History:  Diagnosis Date  . Anemia 10/15/2016  . CKD (chronic kidney disease) stage 3, GFR 30-59 ml/min (HCC) 09/01/2016  . GERD (gastroesophageal reflux disease) 10/15/2016  . High cholesterol   . Hypertension   . LBBB  (left bundle branch block) 09/01/2016  . Macular degeneration 10/15/2016  . Syncope 09/01/2016   Due to dehydration. See ER visit on 09/01/2016.  . Vitamin D deficiency 10/15/2016    No past surgical history on file.  Social History   Tobacco Use  . Smoking status: Never Smoker  . Smokeless tobacco: Never Used  Substance Use Topics  . Alcohol use: No  . Drug use: No    Family History  Problem Relation Age of Onset  . Heart disease Mother   . Hypertension Father   . Early death Brother   . Heart disease Paternal Grandmother   . Heart disease Brother   . Heart disease Brother     Allergies  Allergen Reactions  . Lisinopril-Hydrochlorothiazide Swelling    Angioedema  . Penicillins     Has patient had a PCN reaction causing immediate rash, facial/tongue/throat swelling, SOB or lightheadedness with hypotension: Yes Has patient had a PCN reaction causing severe rash involving mucus membranes or skin necrosis: Unknown Has patient had a PCN reaction that required hospitalization: Unknown Has patient had a PCN reaction occurring within the last 10 years: No If all of the above answers are "NO", then may proceed with Cephalosporin use.    Medication list has been reviewed and updated.  Current Outpatient Medications on File Prior to Visit  Medication Sig Dispense Refill  . acetaminophen (TYLENOL) 325 MG tablet Take 2 tablets (650 mg total) by mouth every 6 (six) hours as  needed for mild pain or moderate pain. OTC 30 tablet 0  . amLODipine (NORVASC) 5 MG tablet TAKE 1 TABLET(5 MG) BY MOUTH DAILY 90 tablet 0  . aspirin EC 81 MG tablet Take 81 mg by mouth daily.    . Cholecalciferol (D3-1000) 1000 units capsule Take 1,000 Units by mouth daily.    . Omega-3 Fatty Acids (FISH OIL) 1000 MG CAPS Take 1,000 mg by mouth 2 (two) times daily.    Marland Kitchen omeprazole (PRILOSEC) 40 MG capsule TAKE 1 CAPSULE(40 MG) BY MOUTH DAILY BEFORE BREAKFAST 90 capsule 0  . pravastatin (PRAVACHOL) 20 MG tablet  TAKE 1 TABLET(20 MG) BY MOUTH DAILY 90 tablet 2  . trimethoprim-polymyxin b (POLYTRIM) ophthalmic solution INT 1 GTT INTO OS QID 2 DAYS AFTER EACH MONTHLY INJECTION  12   No current facility-administered medications on file prior to visit.    Review of Systems:  As per HPI- otherwise negative. No fever or chills  Physical Examination: There were no vitals filed for this visit. There were no vitals filed for this visit. There is no height or weight on file to calculate BMI. Ideal Body Weight:    Patient observed on video monitor.  She looks well, no cough, wheezing, distress is noted.  She does have a dime sized erythematous macule in the center of her chest.  This appears most consistent with tinea corporis or ringworm She is not taking any vital signs at home Assessment and Plan: Tinea corporis - Plan: Clotrimazole 1 % OINT  Virtual visit today for suspected tinea corporis.  Discussed with patient and her niece.  I called in a prescription for Chlortrimazole ointment-a prescription is easier for her to navigate than looking to over-the-counter options.  She will use this for the next couple of weeks, if symptoms are not resolving she is asked to please alert me.  Sooner if she is getting worse, or has any other symptoms    Signed Lamar Blinks, MD

## 2019-05-11 ENCOUNTER — Encounter: Payer: Self-pay | Admitting: Family Medicine

## 2019-05-11 ENCOUNTER — Ambulatory Visit (INDEPENDENT_AMBULATORY_CARE_PROVIDER_SITE_OTHER): Payer: Medicare Other | Admitting: Family Medicine

## 2019-05-11 ENCOUNTER — Other Ambulatory Visit: Payer: Self-pay

## 2019-05-11 VITALS — BP 136/68 | HR 84 | Temp 97.3°F | Ht 62.0 in | Wt 154.4 lb

## 2019-05-11 DIAGNOSIS — E559 Vitamin D deficiency, unspecified: Secondary | ICD-10-CM

## 2019-05-11 DIAGNOSIS — I10 Essential (primary) hypertension: Secondary | ICD-10-CM

## 2019-05-11 DIAGNOSIS — N183 Chronic kidney disease, stage 3 unspecified: Secondary | ICD-10-CM

## 2019-05-11 DIAGNOSIS — M816 Localized osteoporosis [Lequesne]: Secondary | ICD-10-CM

## 2019-05-11 DIAGNOSIS — N3946 Mixed incontinence: Secondary | ICD-10-CM | POA: Diagnosis not present

## 2019-05-11 DIAGNOSIS — R739 Hyperglycemia, unspecified: Secondary | ICD-10-CM

## 2019-05-11 DIAGNOSIS — E782 Mixed hyperlipidemia: Secondary | ICD-10-CM | POA: Diagnosis not present

## 2019-05-11 LAB — VITAMIN D 25 HYDROXY (VIT D DEFICIENCY, FRACTURES): VITD: 64.08 ng/mL (ref 30.00–100.00)

## 2019-05-11 LAB — CBC WITH DIFFERENTIAL/PLATELET
Basophils Absolute: 0 10*3/uL (ref 0.0–0.1)
Basophils Relative: 0.3 % (ref 0.0–3.0)
Eosinophils Absolute: 0.1 10*3/uL (ref 0.0–0.7)
Eosinophils Relative: 1.4 % (ref 0.0–5.0)
HCT: 41.1 % (ref 36.0–46.0)
Hemoglobin: 13.3 g/dL (ref 12.0–15.0)
Lymphocytes Relative: 53.5 % — ABNORMAL HIGH (ref 12.0–46.0)
Lymphs Abs: 4.8 10*3/uL — ABNORMAL HIGH (ref 0.7–4.0)
MCHC: 32.4 g/dL (ref 30.0–36.0)
MCV: 84.5 fl (ref 78.0–100.0)
Monocytes Absolute: 0.4 10*3/uL (ref 0.1–1.0)
Monocytes Relative: 5 % (ref 3.0–12.0)
Neutro Abs: 3.5 10*3/uL (ref 1.4–7.7)
Neutrophils Relative %: 39.8 % — ABNORMAL LOW (ref 43.0–77.0)
Platelets: 309 10*3/uL (ref 150.0–400.0)
RBC: 4.86 Mil/uL (ref 3.87–5.11)
RDW: 14.3 % (ref 11.5–15.5)
WBC: 8.9 10*3/uL (ref 4.0–10.5)

## 2019-05-11 LAB — COMPREHENSIVE METABOLIC PANEL
ALT: 15 U/L (ref 0–35)
AST: 15 U/L (ref 0–37)
Albumin: 4.1 g/dL (ref 3.5–5.2)
Alkaline Phosphatase: 86 U/L (ref 39–117)
BUN: 12 mg/dL (ref 6–23)
CO2: 29 mEq/L (ref 19–32)
Calcium: 9.4 mg/dL (ref 8.4–10.5)
Chloride: 103 mEq/L (ref 96–112)
Creatinine, Ser: 1.31 mg/dL — ABNORMAL HIGH (ref 0.40–1.20)
GFR: 46.49 mL/min — ABNORMAL LOW (ref >60.00)
Glucose, Bld: 108 mg/dL — ABNORMAL HIGH (ref 70–99)
Potassium: 4.3 mEq/L (ref 3.5–5.1)
Sodium: 141 mEq/L (ref 135–145)
Total Bilirubin: 0.8 mg/dL (ref 0.2–1.2)
Total Protein: 7.1 g/dL (ref 6.0–8.3)

## 2019-05-11 LAB — LIPID PANEL
Cholesterol: 161 mg/dL (ref 0–200)
HDL: 51.9 mg/dL (ref >39.00)
LDL Cholesterol: 82 mg/dL (ref 0–99)
NonHDL: 109.32
Total CHOL/HDL Ratio: 3
Triglycerides: 139 mg/dL (ref 0.0–149.0)
VLDL: 27.8 mg/dL (ref 0.0–40.0)

## 2019-05-11 LAB — HEMOGLOBIN A1C: Hgb A1c MFr Bld: 6.4 % (ref 4.6–6.5)

## 2019-05-11 NOTE — Patient Instructions (Addendum)
-  continue your calcium and vitamin D for your bones and walking.  -you have chronic kidney disease and can NOT take NSAIDS (advil, ibuprofen, aleve, naproxen, etc). You can take tylenol for pain and fever.   -checking all of your routine labs today. Like to see you every 6 months to watch your labs/blood pressure.   -try gold bond powder. It sounds like it is more sweat on the sides of the panty. If continues, you can call medicare and ask for pads then they will send me paperwork to fill out. Also get a barrier cream to put on the crease of your leg as you have a tear in skin (boudreuxs butt paste is my favorite)

## 2019-05-11 NOTE — Addendum Note (Signed)
Addended by: Young Berry T on: 05/11/2019 04:24 PM   Modules accepted: Orders

## 2019-05-11 NOTE — Progress Notes (Signed)
Patient: Whitney Davidson MRN: [id number] DOB: 07-31-1930 PCP: No primary care provider on file.     Subjective:  Chief Complaint  Patient presents with  . Hypertension  . Hyperlipidemia  . vitamin D deficiency  . Osteoporosis    HPI: The patient is a 86 y.o. female who presents today for transfer of care. She has past medical history significant for HTN, hyperlipidemia on statin, vitamin D deficiency, GERD, osteoporosis, CKD, LBB and macular degeneration.   Hypertension: Here for follow up of hypertension.  Currently on norvasc 5mg  . Takes medication as prescribed and denies any side effects. Exercise includes walking. She walks 3 miles/day. Weight has been slowly increasing as she is not walking like she used to due to winter weather. Denies any chest pain, headaches, shortness of breath, vision changes, swelling in lower extremities.   Hyperlipidemia: currently on a pravachol 20mg /day. Does not smoke. Does have elevated sugar, a1c being checked today. She does have family history of heart disease in her mother, brother. She is exercising (walking 3 miles a day when weather allows).   Hx of vitamin d deficiency: due for labs today. Takes 1000IU/day.   Osteoporosis: 2 years ago per chart declined any medication. On calcium and vitamin D daily. Last DEXA was in 2019 with T-score of -2.9. she does try to walk for exercise.   CKD: baseline creatinine around 1.3-1.4.  She seems to be unaware of this. Not taking NSAIDs.   She also has some concerns for urinary incontinence. She seems to have more stress incontinence. She urinates every time she stands up and at times she feels like she urinates and doesn't even know it. She did try depends, but this irritated her more. A pad seems to help more, but she wonders why her panties on the side are wet. The pad is not soaked. She does change her pad frequently. She states she has constant irritation down there. It is red in color. Denies any dysuria,  blood in stool, flank pain.   Review of Systems  Constitutional: Negative for chills, fever and unexpected weight change.  Respiratory: Negative for cough, chest tightness and shortness of breath.   Cardiovascular: Negative for chest pain, palpitations and leg swelling.  Gastrointestinal: Negative for abdominal pain, blood in stool, diarrhea, nausea and vomiting.  Genitourinary: Positive for urgency. Negative for decreased urine volume, dysuria, flank pain, frequency and hematuria.  Skin: Positive for rash (groin area).  Neurological: Negative for dizziness, facial asymmetry, weakness and headaches.  Psychiatric/Behavioral: Negative for confusion and sleep disturbance.    Allergies Patient is allergic to lisinopril-hydrochlorothiazide and penicillins.  Past Medical History Patient  has a past medical history of Anemia (10/15/2016), CKD (chronic kidney disease) stage 3, GFR 30-59 ml/min (09/01/2016), GERD (gastroesophageal reflux disease) (10/15/2016), High cholesterol, Hypertension, LBBB (left bundle branch block) (09/01/2016), Macular degeneration (10/15/2016), Syncope (09/01/2016), and Vitamin D deficiency (10/15/2016).  Surgical History Patient  has no past surgical history on file.  Family History Pateint's family history includes Early death in her brother; Heart disease in her brother, brother, mother, and paternal grandmother; Hypertension in her father.  Social History Patient  reports that she has never smoked. She has never used smokeless tobacco. She reports that she does not drink alcohol or use drugs.    Objective: Vitals:   05/11/19 0953  BP: 136/68  Pulse: 84  Temp: (!) 97.3 F (36.3 C)  TempSrc: Temporal  SpO2: 97%  Weight: 154 lb 6.4 oz (70 kg)  Height: 5\' 2"  (  1.575 m)    Body mass index is 28.24 kg/m.  Physical Exam Vitals reviewed.  Constitutional:      Appearance: Normal appearance. She is well-developed.  HENT:     Head: Normocephalic and  atraumatic.     Right Ear: Tympanic membrane, ear canal and external ear normal.     Left Ear: Tympanic membrane, ear canal and external ear normal.     Nose: Nose normal.     Mouth/Throat:     Mouth: Mucous membranes are moist.  Eyes:     Conjunctiva/sclera: Conjunctivae normal.     Pupils: Pupils are equal, round, and reactive to light.  Neck:     Thyroid: No thyromegaly.     Vascular: No carotid bruit.  Cardiovascular:     Rate and Rhythm: Normal rate and regular rhythm.     Heart sounds: Normal heart sounds. No murmur.  Pulmonary:     Effort: Pulmonary effort is normal.     Breath sounds: Normal breath sounds.  Abdominal:     General: Bowel sounds are normal. There is no distension.     Palpations: Abdomen is soft.     Tenderness: There is no abdominal tenderness.  Musculoskeletal:     Cervical back: Normal range of motion and neck supple.  Lymphadenopathy:     Cervical: No cervical adenopathy.  Skin:    General: Skin is warm and dry.     Findings: Rash (erythematous rash in groins and around vagina in shape of panties. she has skin breakdown in crease of leg/groin. ) present.  Neurological:     Mental Status: She is alert and oriented to person, place, and time.     Cranial Nerves: No cranial nerve deficit.     Coordination: Coordination normal.     Deep Tendon Reflexes: Reflexes normal.  Psychiatric:        Mood and Affect: Mood normal.        Behavior: Behavior normal.           Office Visit from 05/11/2019 in [facility] Total Score  0     Fall Risk  05/11/2019 02/27/2019 10/29/2017 10/29/2017 10/21/2017  Falls in the past year? 0 0 No No No  Comment - Emmi Telephone Survey: data to providers prior to load - - -     Assessment/plan: 1. Essential hypertension, on Norvasc and ASA Blood pressure is to goal. Continue current anti-hypertensive medications. Refills not given and routine lab work will be done today. Recommended routine  exercise and healthy diet including DASH diet and mediterranean diet. Encouraged weight loss. F/u in 6 months.   - Comprehensive metabolic panel - CBC with Differential/Platelet - Microalbumin / creatinine urine ratio; Future  2. Stage 3 chronic kidney disease, unspecified whether stage 3a or 3b CKD Routine labs and again emphasized and wrote down no NSAIDs or other nephrotoxic drugs. Make sure and drink water. Checking q 3 months.  - Comprehensive metabolic panel  3. Mixed hyperlipidemia  - Lipid panel  4. Vitamin D deficiency  - VITAMIN D 25 Hydroxy (Vit-D Deficiency, Fractures)  5. Elevated blood sugar  - Hemoglobin A1c  6. Localized osteoporosis without current pathological fracture, takes calcium and D, refuses medication -continue weight bearing exercises and calcium/vitamin D. Declines any other treatment.   7. Mixed stress and urge urinary incontinence -discussed with her that the sides  Of her panties are wet due to sweat as her pad is not even close to be  soaked. Advise she wear much looser fitting clothes, try a thinner pad that is more a liner with no wings, start gold bond powder and barrier cream in crease of skin where she has skin breakdown from pad. Plan frequent bathroom breaks and decrease diuretic beverages. If she is interested in medicare supplying pads, she will need to call and have them send me paperwork. Also checking to rule out any infection. F/u in 3 months.  - Urine Culture; Future - Urinalysis, Routine w reflex microscopic; Future     Return in about 3 months (around 08/08/2019) for blood pressure/kidney check .   Total time of encounter: 45 minutes total time of encounter, including 35 minutes spent in face-to-face patient care. This time includes coordination of care and counseling regarding chronic medical conditions, HM, plan of care. Remainder of non-face-to-face time involved reviewing chart documents/testing relevant to the patient encounter  and documentation in the medical record.   Orma Flaming, MD [place]  05/11/2019

## 2019-05-12 LAB — MICROALBUMIN / CREATININE URINE RATIO
Creatinine,U: 192.9 mg/dL
Microalb Creat Ratio: 0.6 mg/g (ref 0.0–30.0)
Microalb, Ur: 1.1 mg/dL (ref 0.0–1.9)

## 2019-05-12 LAB — URINALYSIS, ROUTINE W REFLEX MICROSCOPIC
Bilirubin Urine: NEGATIVE
Hgb urine dipstick: NEGATIVE
Ketones, ur: NEGATIVE
Nitrite: NEGATIVE
Specific Gravity, Urine: 1.025 (ref 1.000–1.030)
Total Protein, Urine: NEGATIVE
Urine Glucose: NEGATIVE
Urobilinogen, UA: 0.2 (ref 0.0–1.0)
pH: 5.5 (ref 5.0–8.0)

## 2019-05-13 LAB — URINE CULTURE
MICRO NUMBER:: 10102404
SPECIMEN QUALITY:: ADEQUATE

## 2019-05-14 ENCOUNTER — Encounter: Payer: Self-pay | Admitting: Family Medicine

## 2019-05-14 DIAGNOSIS — N1832 Chronic kidney disease, stage 3b: Secondary | ICD-10-CM | POA: Insufficient documentation

## 2019-05-14 DIAGNOSIS — I129 Hypertensive chronic kidney disease with stage 1 through stage 4 chronic kidney disease, or unspecified chronic kidney disease: Secondary | ICD-10-CM | POA: Insufficient documentation

## 2019-05-14 DIAGNOSIS — R7303 Prediabetes: Secondary | ICD-10-CM | POA: Insufficient documentation

## 2019-06-01 ENCOUNTER — Other Ambulatory Visit: Payer: Self-pay | Admitting: Family Medicine

## 2019-06-01 DIAGNOSIS — K219 Gastro-esophageal reflux disease without esophagitis: Secondary | ICD-10-CM

## 2019-06-01 DIAGNOSIS — I10 Essential (primary) hypertension: Secondary | ICD-10-CM

## 2019-06-01 NOTE — Telephone Encounter (Signed)
See request °

## 2019-06-01 NOTE — Telephone Encounter (Signed)
Please review

## 2019-06-02 ENCOUNTER — Encounter (INDEPENDENT_AMBULATORY_CARE_PROVIDER_SITE_OTHER): Payer: Medicare Other | Admitting: Ophthalmology

## 2019-06-02 ENCOUNTER — Other Ambulatory Visit: Payer: Self-pay

## 2019-06-02 DIAGNOSIS — I10 Essential (primary) hypertension: Secondary | ICD-10-CM

## 2019-06-02 DIAGNOSIS — H35033 Hypertensive retinopathy, bilateral: Secondary | ICD-10-CM | POA: Diagnosis not present

## 2019-06-02 DIAGNOSIS — H43813 Vitreous degeneration, bilateral: Secondary | ICD-10-CM | POA: Diagnosis not present

## 2019-06-02 DIAGNOSIS — H353231 Exudative age-related macular degeneration, bilateral, with active choroidal neovascularization: Secondary | ICD-10-CM

## 2019-06-02 MED ORDER — AMLODIPINE BESYLATE 5 MG PO TABS: ORAL_TABLET | ORAL | 3 refills | Status: DC

## 2019-07-20 ENCOUNTER — Telehealth: Payer: Self-pay | Admitting: Family Medicine

## 2019-07-20 DIAGNOSIS — I10 Essential (primary) hypertension: Secondary | ICD-10-CM

## 2019-07-20 MED ORDER — PRAVASTATIN SODIUM 20 MG PO TABS
ORAL_TABLET | ORAL | 2 refills | Status: DC
Start: 2019-07-20 — End: 2019-11-09

## 2019-07-20 NOTE — Telephone Encounter (Signed)
  LAST APPOINTMENT DATE: 06/02/2019   NEXT APPOINTMENT DATE:@Visit  date not found  MEDICATION:pravastatin (PRAVACHOL) 20 MG tablet  PHARMACY: [id number] - [place], [place] - [address] AT [facility] [address] Phone:  [phone]         **Let patient know to contact pharmacy at the end of the day to make sure medication is ready. **  ** Please notify patient to allow 48-72 hours to process**  **Encourage patient to contact the pharmacy for refills or they can request refills through [facility]**  CLINICAL FILLS OUT ALL BELOW:   LAST REFILL:  QTY:  REFILL DATE:    OTHER COMMENTS:    Okay for refill?  Please advise

## 2019-08-07 ENCOUNTER — Ambulatory Visit (INDEPENDENT_AMBULATORY_CARE_PROVIDER_SITE_OTHER): Payer: Medicare Other | Admitting: Family Medicine

## 2019-08-07 ENCOUNTER — Ambulatory Visit (INDEPENDENT_AMBULATORY_CARE_PROVIDER_SITE_OTHER): Payer: Medicare Other

## 2019-08-07 ENCOUNTER — Other Ambulatory Visit: Payer: Self-pay

## 2019-08-07 ENCOUNTER — Encounter: Payer: Self-pay | Admitting: Family Medicine

## 2019-08-07 VITALS — BP 170/70 | HR 95 | Temp 98.5°F | Ht 62.0 in | Wt 151.2 lb

## 2019-08-07 DIAGNOSIS — Z0000 Encounter for general adult medical examination without abnormal findings: Secondary | ICD-10-CM | POA: Diagnosis not present

## 2019-08-07 DIAGNOSIS — B372 Candidiasis of skin and nail: Secondary | ICD-10-CM

## 2019-08-07 MED ORDER — CLOTRIMAZOLE 1 % EX CREA: 1.0000 "application " | TOPICAL_CREAM | Freq: Two times a day (BID) | CUTANEOUS | 1 refills | Status: DC

## 2019-08-07 MED ORDER — FLUCONAZOLE 150 MG PO TABS: ORAL_TABLET | ORAL | 0 refills | Status: DC

## 2019-08-07 NOTE — Progress Notes (Signed)
This visit is being conducted via phone call due to the COVID-19 pandemic. This patient has given me verbal consent via phone to conduct this visit, patient states they are participating from their home address. Some vital signs may be absent or patient reported.   Patient identification: identified by name, DOB, and current address.  Location provider: [facility], Office Persons participating in the virtual visit: Kandis Fantasia LPN, patient, and Dr. Orland Mustard     Subjective:   Whitney Davidson is a 86 y.o. female who presents for Medicare Annual (Subsequent) preventive examination.  Review of Systems:   Cardiac Risk Factors include: advanced age (>76men, >54 women);hypertension;dyslipidemia    Objective:     Vitals: There were no vitals taken for this visit.  There is no height or weight on file to calculate BMI.  Advanced Directives 08/07/2019 10/29/2017 09/02/2016  Does Patient Have a Medical Advance Directive? Yes Yes No  Type of Estate agent of [employer] Power of [place];Living will -  Does patient want to make changes to medical advance directive? No - Patient declined No - Patient declined -  Copy of Healthcare Power of Attorney in Chart? No - copy requested No - copy requested -  Would patient like information on creating a medical advance directive? - - No - Patient declined    Tobacco Social History   Tobacco Use  Smoking Status Never Smoker  Smokeless Tobacco Never Used     Counseling given: Not Answered   Clinical Intake:  Pre-visit preparation completed: Yes  Pain : No/denies pain  Diabetes: No  How often do you need to have someone help you when you read instructions, pamphlets, or other written materials from your doctor or pharmacy?: 2 - Rarely  Interpreter Needed?: No  Information entered by :: Kandis Fantasia LPN  Past Medical History:  Diagnosis Date  . Anemia 10/15/2016  . CKD (chronic kidney disease) stage 3,  GFR 30-59 ml/min 09/01/2016  . GERD (gastroesophageal reflux disease) 10/15/2016  . High cholesterol   . Hypertension   . LBBB (left bundle branch block) 09/01/2016  . Macular degeneration 10/15/2016  . Syncope 09/01/2016   Due to dehydration. See ER visit on 09/01/2016.  . Vitamin D deficiency 10/15/2016   History reviewed. No pertinent surgical history. Family History  Problem Relation Age of Onset  . Heart disease Mother   . Hypertension Father   . Early death Brother   . Heart disease Paternal Grandmother   . Heart disease Brother   . Heart disease Brother    Social History   Socioeconomic History  . Marital status: Widowed    Spouse name: Not on file  . Number of children: Not on file  . Years of education: Not on file  . Highest education level: Not on file  Occupational History  . Occupation: Retired   Tobacco Use  . Smoking status: Never Smoker  . Smokeless tobacco: Never Used  Substance and Sexual Activity  . Alcohol use: No  . Drug use: No  . Sexual activity: Not Currently    Birth control/protection: Post-menopausal  Other Topics Concern  . Not on file  Social History Narrative   Lives alone   Doesn't drive    Niece lives nearby and assists and transports    Social Determinants of Health   Financial Resource Strain:   . Difficulty of Paying Living Expenses:   Food Insecurity:   . Worried About Programme researcher, broadcasting/film/video in the  Last Year:   . Ran Out of Food in the Last Year:   Transportation Needs:   . Freight forwarder (Medical):   Marland Kitchen Lack of Transportation (Non-Medical):   Physical Activity:   . Days of Exercise per Week:   . Minutes of Exercise per Session:   Stress:   . Feeling of Stress :   Social Connections:   . Frequency of Communication with Friends and Family:   . Frequency of Social Gatherings with Friends and Family:   . Attends Religious Services:   . Active Member of Clubs or Organizations:   . Attends Banker Meetings:    Marland Kitchen Marital Status:     Outpatient Encounter Medications as of 08/07/2019  Medication Sig  . acetaminophen (TYLENOL) 325 MG tablet Take 2 tablets (650 mg total) by mouth every 6 (six) hours as needed for mild pain or moderate pain. OTC  . amLODipine (NORVASC) 5 MG tablet TAKE 1 TABLET(5 MG) BY MOUTH DAILY  . aspirin EC 81 MG tablet Take 81 mg by mouth daily.  . Cholecalciferol (D3-1000) 1000 units capsule Take 1,000 Units by mouth daily.  . Omega-3 Fatty Acids (FISH OIL) 1000 MG CAPS Take 1,000 mg by mouth 2 (two) times daily.  Marland Kitchen omeprazole (PRILOSEC) 40 MG capsule TAKE 1 CAPSULE(40 MG) BY MOUTH DAILY BEFORE BREAKFAST  . pravastatin (PRAVACHOL) 20 MG tablet Take one tablet by mouth daily  . trimethoprim-polymyxin b (POLYTRIM) ophthalmic solution INT 1 GTT INTO OS QID 2 DAYS AFTER EACH MONTHLY INJECTION  . [DISCONTINUED] Clotrimazole 1 % OINT Apply twice a day as needed to ringworm- use for 2-4 weeks as needed (Patient not taking: Reported on 05/11/2019)  . [DISCONTINUED] ketorolac (ACULAR) 0.5 % ophthalmic solution INT 1 GTT IN OS TID FOR 5 DAYS THEN STOP   No facility-administered encounter medications on file as of 08/07/2019.    Activities of Daily Living In your present state of health, do you have any difficulty performing the following activities: 08/07/2019 05/11/2019  Hearing? Y Y  Comment bilateral hearing aids -  Vision? N N  Difficulty concentrating or making decisions? N N  Walking or climbing stairs? - N  Dressing or bathing? N N  Doing errands, shopping? N N  Comment - Pt's neice or nephew always bring her to appointments.  Preparing Food and eating ? N -  Using the Toilet? N -  In the past six months, have you accidently leaked urine? N -  Do you have problems with loss of bowel control? N -  Managing your Medications? N -  Managing your Finances? N -  Housekeeping or managing your Housekeeping? N -  Some recent data might be hidden    Patient Care Team: Orland Mustard, MD as PCP - General (Family Medicine) Sherrie George, MD as Consulting Physician (Ophthalmology)    Assessment:   This is a routine wellness examination for Whitney Davidson.  Exercise Activities and Dietary recommendations Current Exercise Habits: The patient does not participate in regular exercise at present  Goals    . Increase physical activity     Walk 6 days a week/ 45 minutes       Fall Risk Fall Risk  08/07/2019 05/11/2019 02/27/2019 10/29/2017 10/29/2017  Falls in the past year? 0 0 0 No No  Comment - - Emmi Telephone Survey: data to providers prior to load - -  Number falls in past yr: 0 - - - -  Injury with Fall? 0 - - - -  Follow up Falls evaluation completed;Education provided;Falls prevention discussed - - - -   Is the patient's home free of loose throw rugs in walkways, pet beds, electrical cords, etc?   yes      Grab bars in the bathroom? yes      Handrails on the stairs?   yes      Adequate lighting?   yes   Depression Screen PHQ 2/9 Scores 08/07/2019 05/11/2019 10/29/2017 10/26/2017  PHQ - 2 Score 0 0 0 0  PHQ- 9 Score - 3 1 -     Cognitive Function- no cognitive concerns at this time      6CIT Screen 08/07/2019 10/29/2017  What Year? 0 points 0 points  What month? 0 points 0 points  What time? 0 points 0 points  Count back from 20 0 points 0 points  Months in reverse 0 points 0 points  Repeat phrase 0 points -  Total Score 0 -    Immunization History  Administered Date(s) Administered  . Influenza-Unspecified 04/09/2018    Qualifies for Shingles Vaccine?Discussed and patient will check with pharmacy for coverage.  Patient education handout provided   Screening Tests Health Maintenance  Topic Date Due  . COVID-19 Vaccine (1) Never done  . DEXA SCAN  04/30/2020  . INFLUENZA VACCINE  Discontinued  . TETANUS/TDAP  Discontinued  . PNA vac Low Risk Adult  Discontinued    Cancer Screenings: Lung: Low Dose CT Chest recommended if Age 67-80 years, 30  pack-year currently smoking OR have quit w/in 15years. Patient does not qualify. Breast:  Up to date on Mammogram? Yes   Up to date of Bone Density/Dexa? Yes Colorectal: No longer indicated    Plan:    I have personally reviewed and addressed the Medicare Annual Wellness questionnaire and have noted the following in the patient's chart:  A. Medical and social history B. Use of alcohol, tobacco or illicit drugs  C. Current medications and supplements D. Functional ability and status E.  Nutritional status F.  Physical activity G. Advance directives H. List of other physicians I.  Hospitalizations, surgeries, and ER visits in previous 12 months J.  Saks such as hearing and vision if needed, cognitive and depression L. Referrals, records requested, and appointments- none   In addition, I have reviewed and discussed with patient certain preventive protocols, quality metrics, and best practice recommendations. A written personalized care plan for preventive services as well as general preventive health recommendations were provided to patient.   Signed,  Denman George, LPN  Nurse Health Advisor   Nurse Notes: Patient with complaints of rash under breasts and in groin area x 1 month.  Scheduled for office visit with provider.

## 2019-08-07 NOTE — Patient Instructions (Signed)
Whitney Davidson , Thank you for taking time to come for your Medicare Wellness Visit. I appreciate your ongoing commitment to your health goals. Please review the following plan we discussed and let me know if I can assist you in the future.   Screening recommendations/referrals: Colorectal Screening: No longer indicated  Mammogram: No longer indicated  Bone Density: No longer indicated   Vision and Dental Exams: Recommended annual ophthalmology exams for early detection of glaucoma and other disorders of the eye Recommended annual dental exams for proper oral hygiene  Vaccinations: Influenza vaccine: recommended yearly Pneumococcal vaccine: recommended Tdap vaccine: recommended every 10 years; Please call your insurance company to determine your out of pocket expense. You also receive this vaccine at your local pharmacy or Health Dept. Shingles vaccine: You may receive this vaccine at your local pharmacy. (see handout) Covid vaccine: recommended   Advanced directives: Please bring a copy of your POA (Power of Attorney) and/or Living Will to your next appointment.  Goals: Recommend to drink at least 6-8 8oz glasses of water per day and consume a balanced diet rich in fresh fruits and vegetables.   Next appointment: Please schedule your Annual Wellness Visit with your Nurse Health Advisor in one year.  Preventive Care 42 Years and Older, Female Preventive care refers to lifestyle choices and visits with your health care provider that can promote health and wellness. What does preventive care include?  A yearly physical exam. This is also called an annual well check.  Dental exams once or twice a year.  Routine eye exams. Ask your health care provider how often you should have your eyes checked.  Personal lifestyle choices, including:  Daily care of your teeth and gums.  Regular physical activity.  Eating a healthy diet.  Avoiding tobacco and drug use.  Limiting alcohol use.   Practicing safe sex.  Taking low-dose aspirin every day if recommended by your health care provider.  Taking vitamin and mineral supplements as recommended by your health care provider. What happens during an annual well check? The services and screenings done by your health care provider during your annual well check will depend on your age, overall health, lifestyle risk factors, and family history of disease. Counseling  Your health care provider may ask you questions about your:  Alcohol use.  Tobacco use.  Drug use.  Emotional well-being.  Home and relationship well-being.  Sexual activity.  Eating habits.  History of falls.  Memory and ability to understand (cognition).  Work and work Statistician.  Reproductive health. Screening  You may have the following tests or measurements:  Height, weight, and BMI.  Blood pressure.  Lipid and cholesterol levels. These may be checked every 5 years, or more frequently if you are over 52 years old.  Skin check.  Lung cancer screening. You may have this screening every year starting at age 83 if you have a 30-pack-year history of smoking and currently smoke or have quit within the past 15 years.  Fecal occult blood test (FOBT) of the stool. You may have this test every year starting at age 61.  Flexible sigmoidoscopy or colonoscopy. You may have a sigmoidoscopy every 5 years or a colonoscopy every 10 years starting at age 58.  Hepatitis C blood test.  Hepatitis B blood test.  Sexually transmitted disease (STD) testing.  Diabetes screening. This is done by checking your blood sugar (glucose) after you have not eaten for a while (fasting). You may have this done every 1-3 years.  Bone density scan. This is done to screen for osteoporosis. You may have this done starting at age 13.  Mammogram. This may be done every 1-2 years. Talk to your health care provider about how often you should have regular mammograms. Talk  with your health care provider about your test results, treatment options, and if necessary, the need for more tests. Vaccines  Your health care provider may recommend certain vaccines, such as:  Influenza vaccine. This is recommended every year.  Tetanus, diphtheria, and acellular pertussis (Tdap, Td) vaccine. You may need a Td booster every 10 years.  Zoster vaccine. You may need this after age 89.  Pneumococcal 13-valent conjugate (PCV13) vaccine. One dose is recommended after age 21.  Pneumococcal polysaccharide (PPSV23) vaccine. One dose is recommended after age 37. Talk to your health care provider about which screenings and vaccines you need and how often you need them. This information is not intended to replace advice given to you by your health care provider. Make sure you discuss any questions you have with your health care provider. Document Released: 04/22/2015 Document Revised: 12/14/2015 Document Reviewed: 01/25/2015 Elsevier Interactive Patient Education  2017 [employer].  Fall Prevention in the Home Falls can cause injuries. They can happen to people of all ages. There are many things you can do to make your home safe and to help prevent falls. What can I do on the outside of my home?  Regularly fix the edges of walkways and driveways and fix any cracks.  Remove anything that might make you trip as you walk through a door, such as a raised step or threshold.  Trim any bushes or trees on the path to your home.  Use bright outdoor lighting.  Clear any walking paths of anything that might make someone trip, such as rocks or tools.  Regularly check to see if handrails are loose or broken. Make sure that both sides of any steps have handrails.  Any raised decks and porches should have guardrails on the edges.  Have any leaves, snow, or ice cleared regularly.  Use sand or salt on walking paths during winter.  Clean up any spills in your garage right away. This  includes oil or grease spills. What can I do in the bathroom?  Use night lights.  Install grab bars by the toilet and in the tub and shower. Do not use towel bars as grab bars.  Use non-skid mats or decals in the tub or shower.  If you need to sit down in the shower, use a plastic, non-slip stool.  Keep the floor dry. Clean up any water that spills on the floor as soon as it happens.  Remove soap buildup in the tub or shower regularly.  Attach bath mats securely with double-sided non-slip rug tape.  Do not have throw rugs and other things on the floor that can make you trip. What can I do in the bedroom?  Use night lights.  Make sure that you have a light by your bed that is easy to reach.  Do not use any sheets or blankets that are too big for your bed. They should not hang down onto the floor.  Have a firm chair that has side arms. You can use this for support while you get dressed.  Do not have throw rugs and other things on the floor that can make you trip. What can I do in the kitchen?  Clean up any spills right away.  Avoid walking  on wet floors.  Keep items that you use a lot in easy-to-reach places.  If you need to reach something above you, use a strong step stool that has a grab bar.  Keep electrical cords out of the way.  Do not use floor polish or wax that makes floors slippery. If you must use wax, use non-skid floor wax.  Do not have throw rugs and other things on the floor that can make you trip. What can I do with my stairs?  Do not leave any items on the stairs.  Make sure that there are handrails on both sides of the stairs and use them. Fix handrails that are broken or loose. Make sure that handrails are as long as the stairways.  Check any carpeting to make sure that it is firmly attached to the stairs. Fix any carpet that is loose or worn.  Avoid having throw rugs at the top or bottom of the stairs. If you do have throw rugs, attach them to the  floor with carpet tape.  Make sure that you have a light switch at the top of the stairs and the bottom of the stairs. If you do not have them, ask someone to add them for you. What else can I do to help prevent falls?  Wear shoes that:  Do not have high heels.  Have rubber bottoms.  Are comfortable and fit you well.  Are closed at the toe. Do not wear sandals.  If you use a stepladder:  Make sure that it is fully opened. Do not climb a closed stepladder.  Make sure that both sides of the stepladder are locked into place.  Ask someone to hold it for you, if possible.  Clearly mark and make sure that you can see:  Any grab bars or handrails.  First and last steps.  Where the edge of each step is.  Use tools that help you move around (mobility aids) if they are needed. These include:  Canes.  Walkers.  Scooters.  Crutches.  Turn on the lights when you go into a dark area. Replace any light bulbs as soon as they burn out.  Set up your furniture so you have a clear path. Avoid moving your furniture around.  If any of your floors are uneven, fix them.  If there are any pets around you, be aware of where they are.  Review your medicines with your doctor. Some medicines can make you feel dizzy. This can increase your chance of falling. Ask your doctor what other things that you can do to help prevent falls. This information is not intended to replace advice given to you by your health care provider. Make sure you discuss any questions you have with your health care provider. Document Released: 01/20/2009 Document Revised: 09/01/2015 Document Reviewed: 04/30/2014 Elsevier Interactive Patient Education  2017 [employer].

## 2019-08-07 NOTE — Patient Instructions (Signed)
I have prescribed a pill to take daily x 7 days Clotrimazole cream twice a day until rash Is gone Once better, would get a powder called zeasorb to help moisture    Skin Yeast Infection  A skin yeast infection is a condition in which there is an overgrowth of yeast (candida) that normally lives on the skin. This condition usually occurs in areas of the skin that are constantly warm and moist, such as the armpits or the groin. What are the causes? This condition is caused by a change in the normal balance of the yeast and bacteria that live on the skin. What increases the risk? You are more likely to develop this condition if you:  Are obese.  Are pregnant.  Take birth control pills.  Have diabetes.  Take antibiotic medicines.  Take steroid medicines.  Are malnourished.  Have a weak body defense system (immune system).  Are 47 years of age or older.  Wear tight clothing. What are the signs or symptoms? The most common symptom of this condition is itchiness in the affected area. Other symptoms include:  Red, swollen area of the skin.  Bumps on the skin. How is this diagnosed?  This condition is diagnosed with a medical history and physical exam.  Your health care provider may check for yeast by taking light scrapings of the skin to be viewed under a microscope. How is this treated? This condition is treated with medicine. Medicines may be prescribed or be available over the counter. The medicines may be:  Taken by mouth (orally).  Applied as a cream or powder to your skin. Follow these instructions at home:   Take or apply over-the-counter and prescription medicines only as told by your health care provider.  Maintain a healthy weight. If you need help losing weight, talk with your health care provider.  Keep your skin clean and dry.  If you have diabetes, keep your blood sugar under control.  Keep all follow-up visits as told by your health care provider.  This is important. Contact a health care provider if:  Your symptoms go away and then return.  Your symptoms do not get better with treatment.  Your symptoms get worse.  Your rash spreads.  You have a fever or chills.  You have new symptoms.  You have new warmth or redness of your skin. Summary  A skin yeast infection is a condition in which there is an overgrowth of yeast (candida) that normally lives on the skin. This condition is caused by a change in the normal balance of the yeast and bacteria that live on the skin.  Take or apply over-the-counter and prescription medicines only as told by your health care provider.  Keep your skin clean and dry.  Contact a health care provider if your symptoms do not get better with treatment. This information is not intended to replace advice given to you by your health care provider. Make sure you discuss any questions you have with your health care provider. Document Revised: 08/13/2017 Document Reviewed: 08/13/2017 Elsevier Patient Education  2020 [employer].

## 2019-08-07 NOTE — Progress Notes (Signed)
Patient: Whitney Davidson MRN: [id number] DOB: 10/25/1930 PCP: Orland Mustard, MD     Subjective:  Chief Complaint  Patient presents with  . Rash    Under both breast, and groin. Pt says that is has been longer than a month. She says that it itches and burns. Pt's neice says that she tried an oatmeal bath and a skin protectant cream. But it has not healed.    HPI: The patient is a 86 y.o. female who presents today for Rash under both breast and groin. It started over a month ago. She has tried boudreaux's butt paste, gold bond, and another barrier cream. She states it is so itchy and is getting worse. She sweats under her breast and her house is warm. The rash has gotten a lot worse.   Review of Systems  Constitutional: Negative for fever.  Respiratory: Negative for cough, shortness of breath and wheezing.   Cardiovascular: Negative for chest pain and palpitations.  Skin: Positive for rash.  Neurological: Negative for light-headedness and headaches.    Allergies Patient is allergic to lisinopril-hydrochlorothiazide and penicillins.  Past Medical History Patient  has a past medical history of Anemia (10/15/2016), CKD (chronic kidney disease) stage 3, GFR 30-59 ml/min (09/01/2016), GERD (gastroesophageal reflux disease) (10/15/2016), High cholesterol, Hypertension, LBBB (left bundle branch block) (09/01/2016), Macular degeneration (10/15/2016), Syncope (09/01/2016), and Vitamin D deficiency (10/15/2016).  Surgical History Patient  has no past surgical history on file.  Family History Pateint's family history includes Early death in her brother; Heart disease in her brother, brother, mother, and paternal grandmother; Hypertension in her father.  Social History Patient  reports that she has never smoked. She has never used smokeless tobacco. She reports that she does not drink alcohol or use drugs.    Objective: Vitals:   08/07/19 1300  BP: (!) 170/70  Pulse: 95  Temp: 98.5 F (36.9  C)  TempSrc: Temporal  SpO2: 94%  Weight: 151 lb 3.2 oz (68.6 kg)  Height: 5\' 2"  (1.575 m)    Body mass index is 27.65 kg/m.  Physical Exam Vitals reviewed.  Constitutional:      Appearance: Normal appearance. She is normal weight.  HENT:     Head: Normocephalic and atraumatic.  Skin:    Findings: Rash (erythematous plaque like rash under both breasts and groin with scaly edges and some satellite lesions ) present.     Comments: Hair loss on scalp   Neurological:     General: No focal deficit present.     Mental Status: She is alert and oriented to person, place, and time.        Assessment/plan: 1. Candidal intertrigo Quite extensive and severe. 7 day course of diflucan, topical clotrimazole and once improved start zeasorb powder. Went over in detail. Have f/u with her in June/july. Let me know if not getting better.     This visit occurred during the SARS-CoV-2 public health emergency.  Safety protocols were in place, including screening questions prior to the visit, additional usage of staff PPE, and extensive cleaning of exam room while observing appropriate contact time as indicated for disinfecting solutions.     Return in about 2 months (around 10/07/2019) for bp/kidney check up .   10/09/2019, MD  [facility]   08/07/2019

## 2019-08-30 ENCOUNTER — Other Ambulatory Visit: Payer: Self-pay | Admitting: Family Medicine

## 2019-08-30 DIAGNOSIS — K219 Gastro-esophageal reflux disease without esophagitis: Secondary | ICD-10-CM

## 2019-08-31 ENCOUNTER — Encounter (INDEPENDENT_AMBULATORY_CARE_PROVIDER_SITE_OTHER): Payer: Medicare Other | Admitting: Ophthalmology

## 2019-08-31 ENCOUNTER — Other Ambulatory Visit: Payer: Self-pay

## 2019-08-31 DIAGNOSIS — H353231 Exudative age-related macular degeneration, bilateral, with active choroidal neovascularization: Secondary | ICD-10-CM | POA: Diagnosis not present

## 2019-08-31 DIAGNOSIS — I10 Essential (primary) hypertension: Secondary | ICD-10-CM

## 2019-08-31 DIAGNOSIS — H35033 Hypertensive retinopathy, bilateral: Secondary | ICD-10-CM | POA: Diagnosis not present

## 2019-08-31 DIAGNOSIS — H43813 Vitreous degeneration, bilateral: Secondary | ICD-10-CM | POA: Diagnosis not present

## 2019-10-19 ENCOUNTER — Encounter: Payer: Self-pay | Admitting: Physician Assistant

## 2019-10-19 ENCOUNTER — Other Ambulatory Visit: Payer: Self-pay

## 2019-10-19 ENCOUNTER — Ambulatory Visit (INDEPENDENT_AMBULATORY_CARE_PROVIDER_SITE_OTHER): Payer: Medicare Other | Admitting: Physician Assistant

## 2019-10-19 VITALS — BP 150/72 | HR 95 | Temp 98.0°F | Ht 62.0 in | Wt 152.4 lb

## 2019-10-19 DIAGNOSIS — S161XXA Strain of muscle, fascia and tendon at neck level, initial encounter: Secondary | ICD-10-CM

## 2019-10-19 MED ORDER — PREDNISONE 20 MG PO TABS: 40.0000 mg | ORAL_TABLET | Freq: Every day | ORAL | 0 refills | Status: DC

## 2019-10-19 MED ORDER — BACLOFEN 5 MG PO TABS: 2.5000 mg | ORAL_TABLET | Freq: Every day | ORAL | 0 refills | Status: DC | PRN

## 2019-10-19 NOTE — Patient Instructions (Signed)
It was great to see you!  Start daily prednisone.  Take 1/2 tablet (2.5 mg) baclofen for your muscle spasm.  Its ok to take Tylenol if you need to.  Trial the neck stretches.  Keep [place] posted if symptoms worsen.  Take care,  Jarold Motto PA-C

## 2019-10-19 NOTE — Progress Notes (Signed)
Whitney Davidson is a 86 y.o. female here for a new problem.  I acted as a Neurosurgeon for [employer], PA-C Corky Mull, LPN   History of Present Illness:   Chief Complaint  Patient presents with  . Neck Pain    HPI   Neck pain Pt c/o muscular neck pain that starts at the base of skull and radiated into shoulders, started last Wednesday. Pt started taking Tylenol yesterday, and has pain in shoulders that has subsided but she is still having pain in her neck.   She denies: fever, numbness/tingling in upper arms/legs, changes in vision, slurred speech, weakness on one side of the body    Past Medical History:  Diagnosis Date  . Anemia 10/15/2016  . CKD (chronic kidney disease) stage 3, GFR 30-59 ml/min 09/01/2016  . GERD (gastroesophageal reflux disease) 10/15/2016  . High cholesterol   . Hypertension   . LBBB (left bundle branch block) 09/01/2016  . Macular degeneration 10/15/2016  . Syncope 09/01/2016   Due to dehydration. See ER visit on 09/01/2016.  . Vitamin D deficiency 10/15/2016     Social History   Tobacco Use  . Smoking status: Never Smoker  . Smokeless tobacco: Never Used  Vaping Use  . Vaping Use: Never used  Substance Use Topics  . Alcohol use: No  . Drug use: No    History reviewed. No pertinent surgical history.  Family History  Problem Relation Age of Onset  . Heart disease Mother   . Hypertension Father   . Early death Brother   . Heart disease Paternal Grandmother   . Heart disease Brother   . Heart disease Brother     Allergies  Allergen Reactions  . Lisinopril-Hydrochlorothiazide Swelling    Angioedema  . Penicillins     Has patient had a PCN reaction causing immediate rash, facial/tongue/throat swelling, SOB or lightheadedness with hypotension: Yes Has patient had a PCN reaction causing severe rash involving mucus membranes or skin necrosis: Unknown Has patient had a PCN reaction that required hospitalization: Unknown Has patient had a  PCN reaction occurring within the last 10 years: No If all of the above answers are "NO", then may proceed with Cephalosporin use.    Current Medications:   Current Outpatient Medications:  .  acetaminophen (TYLENOL) 325 MG tablet, Take 2 tablets (650 mg total) by mouth every 6 (six) hours as needed for mild pain or moderate pain. OTC, Disp: 30 tablet, Rfl: 0 .  amLODipine (NORVASC) 5 MG tablet, TAKE 1 TABLET(5 MG) BY MOUTH DAILY, Disp: 90 tablet, Rfl: 3 .  aspirin EC 81 MG tablet, Take 81 mg by mouth daily., Disp: , Rfl:  .  Cholecalciferol (D3-1000) 1000 units capsule, Take 1,000 Units by mouth daily., Disp: , Rfl:  .  Omega-3 Fatty Acids (FISH OIL) 1000 MG CAPS, Take 1,000 mg by mouth 2 (two) times daily., Disp: , Rfl:  .  omeprazole (PRILOSEC) 40 MG capsule, TAKE 1 CAPSULE(40 MG) BY MOUTH DAILY BEFORE BREAKFAST, Disp: 90 capsule, Rfl: 0 .  pravastatin (PRAVACHOL) 20 MG tablet, Take one tablet by mouth daily, Disp: 90 tablet, Rfl: 2 .  Baclofen 5 MG TABS, Take 2.5 mg by mouth daily as needed (muscle spasm)., Disp: 20 tablet, Rfl: 0 .  clotrimazole (LOTRIMIN) 1 % cream, Apply 1 application topically 2 (two) times daily. (Patient not taking: Reported on 10/19/2019), Disp: 60 g, Rfl: 1 .  predniSONE (DELTASONE) 20 MG tablet, Take 2 tablets (40 mg total) by mouth  daily., Disp: 10 tablet, Rfl: 0 .  trimethoprim-polymyxin b (POLYTRIM) ophthalmic solution, INT 1 GTT INTO OS QID 2 DAYS AFTER EACH MONTHLY INJECTION (Patient not taking: Reported on 10/19/2019), Disp: , Rfl: 12   Review of Systems:   ROS  Negative unless otherwise specified per HPI.  Vitals:   Vitals:   10/19/19 1427  BP: (!) 150/72  Pulse: 95  Temp: 98 F (36.7 C)  TempSrc: Temporal  SpO2: 97%  Weight: 152 lb 6.1 oz (69.1 kg)  Height: 5\' 2"  (1.575 m)     Body mass index is 27.87 kg/m.  Physical Exam:   Physical Exam Vitals and nursing note reviewed.  Constitutional:      General: She is not in acute distress.     Appearance: She is well-developed. She is not ill-appearing or toxic-appearing.  Cardiovascular:     Rate and Rhythm: Normal rate and regular rhythm.     Pulses: Normal pulses.     Heart sounds: Normal heart sounds, S1 normal and S2 normal.     Comments: No LE edema Pulmonary:     Effort: Pulmonary effort is normal.     Breath sounds: Normal breath sounds.  Musculoskeletal:     Comments: No decreased ROM 2/2 pain with flexion/extension, lateral side bends, or rotation. Reproducible tenderness with deep palpation to cervical bilateral paraspinal muscles. No bony tenderness.   Skin:    General: Skin is warm and dry.  Neurological:     Mental Status: She is alert.     GCS: GCS eye subscore is 4. GCS verbal subscore is 5. GCS motor subscore is 6.  Psychiatric:        Speech: Speech normal.        Behavior: Behavior normal. Behavior is cooperative.     Assessment and Plan:   Whitney Davidson was seen today for neck pain.  Diagnoses and all orders for this visit:  Strain of neck muscle, initial encounter No red flags on exam. Limited options due to kidney disease. She has an appointment for dental cleaning tomorrow and wants to make sure she can tolerate laying down on the table. Cervical stretches handout provided. Will trial oral prednisone and very low dose baclofen. She already has follow-up scheduled with PCP later this week. Worsening precautions advised.  Other orders -     predniSONE (DELTASONE) 20 MG tablet; Take 2 tablets (40 mg total) by mouth daily. -     Baclofen 5 MG TABS; Take 2.5 mg by mouth daily as needed (muscle spasm).  . Reviewed expectations re: course of current medical issues. . Discussed self-management of symptoms. . Outlined signs and symptoms indicating need for more acute intervention. . Patient verbalized understanding and all questions were answered. . See orders for this visit as documented in the electronic medical record. . Patient received an After-Visit  Summary.  CMA or LPN served as scribe during this visit. History, Physical, and Plan performed by medical provider. The above documentation has been reviewed and is accurate and complete.  , PA-C

## 2019-10-22 ENCOUNTER — Ambulatory Visit (INDEPENDENT_AMBULATORY_CARE_PROVIDER_SITE_OTHER): Payer: Medicare Other | Admitting: Family Medicine

## 2019-10-22 ENCOUNTER — Encounter: Payer: Self-pay | Admitting: Family Medicine

## 2019-10-22 ENCOUNTER — Other Ambulatory Visit: Payer: Self-pay

## 2019-10-22 VITALS — BP 142/68 | HR 75 | Temp 97.9°F | Ht 62.0 in | Wt 152.6 lb

## 2019-10-22 DIAGNOSIS — N1831 Chronic kidney disease, stage 3a: Secondary | ICD-10-CM

## 2019-10-22 DIAGNOSIS — R7303 Prediabetes: Secondary | ICD-10-CM

## 2019-10-22 DIAGNOSIS — I10 Essential (primary) hypertension: Secondary | ICD-10-CM | POA: Diagnosis not present

## 2019-10-22 DIAGNOSIS — L659 Nonscarring hair loss, unspecified: Secondary | ICD-10-CM

## 2019-10-22 LAB — CBC WITH DIFFERENTIAL/PLATELET
Basophils Absolute: 0 10*3/uL (ref 0.0–0.1)
Basophils Relative: 0.3 % (ref 0.0–3.0)
Eosinophils Absolute: 0 10*3/uL (ref 0.0–0.7)
Eosinophils Relative: 0.3 % (ref 0.0–5.0)
HCT: 38.1 % (ref 36.0–46.0)
Hemoglobin: 12.4 g/dL (ref 12.0–15.0)
Lymphocytes Relative: 39.1 % (ref 12.0–46.0)
Lymphs Abs: 4.9 10*3/uL — ABNORMAL HIGH (ref 0.7–4.0)
MCHC: 32.5 g/dL (ref 30.0–36.0)
MCV: 84.3 fl (ref 78.0–100.0)
Monocytes Absolute: 0.9 10*3/uL (ref 0.1–1.0)
Monocytes Relative: 7.4 % (ref 3.0–12.0)
Neutro Abs: 6.6 10*3/uL (ref 1.4–7.7)
Neutrophils Relative %: 52.9 % (ref 43.0–77.0)
Platelets: 342 10*3/uL (ref 150.0–400.0)
RBC: 4.53 Mil/uL (ref 3.87–5.11)
RDW: 14.6 % (ref 11.5–15.5)
WBC: 12.5 10*3/uL — ABNORMAL HIGH (ref 4.0–10.5)

## 2019-10-22 LAB — COMPREHENSIVE METABOLIC PANEL
ALT: 22 U/L (ref 0–35)
AST: 17 U/L (ref 0–37)
Albumin: 4 g/dL (ref 3.5–5.2)
Alkaline Phosphatase: 85 U/L (ref 39–117)
BUN: 20 mg/dL (ref 6–23)
CO2: 30 mEq/L (ref 19–32)
Calcium: 9.4 mg/dL (ref 8.4–10.5)
Chloride: 103 mEq/L (ref 96–112)
Creatinine, Ser: 1.17 mg/dL (ref 0.40–1.20)
GFR: 52.91 mL/min — ABNORMAL LOW (ref >60.00)
Glucose, Bld: 70 mg/dL (ref 70–99)
Potassium: 4.3 mEq/L (ref 3.5–5.1)
Sodium: 141 mEq/L (ref 135–145)
Total Bilirubin: 0.2 mg/dL (ref 0.2–1.2)
Total Protein: 7.2 g/dL (ref 6.0–8.3)

## 2019-10-22 LAB — HEMOGLOBIN A1C: Hgb A1c MFr Bld: 6.5 % (ref 4.6–6.5)

## 2019-10-22 LAB — TSH: TSH: 2.33 u[IU]/mL (ref 0.35–4.50)

## 2019-10-22 NOTE — Patient Instructions (Signed)
-  so good to see you.  -watch your blood pressure for me and let me know if consistently running >150/90.  -doing great! -see you in 3 months time. Enjoy the rest of the summer!  Dr. Artis Flock

## 2019-10-22 NOTE — Progress Notes (Signed)
Patient: Whitney Davidson MRN: [id number] DOB: October 05, 1930 PCP: Orland Mustard, MD     Subjective:  Chief Complaint  Patient presents with  . Hypertension  . Chronic Kidney Disease  . Prediabetes  . Alopecia    HPI: The patient is a 86 y.o. female who presents today for HTN/Kidney check.   Hypertension: Here for follow up of hypertension.  Currently on norvasc 5mg  daily. Does not take BP readings at home. Takes medication as prescribed and denies any side effects. Exercise includes walking. Weight has been stable. Denies any chest pain, headaches, shortness of breath, vision changes, swelling in lower extremities.   CKD Baseline creatinine around 1.3-1.4. denies any leg swelling, dark urine. Avoids all NSAIDs.   Prediabetes Due for a1c check today. Last a1c was 6.4.   Hair loss Doing better. Was itching her hair and pulling out herself. She states it's growing back and doing better.   Review of Systems  Constitutional: Negative for chills, fatigue and fever.  HENT: Negative for dental problem, ear pain, hearing loss and trouble swallowing.   Eyes: Negative for visual disturbance.  Respiratory: Negative for cough, chest tightness and shortness of breath.   Cardiovascular: Negative for chest pain, palpitations and leg swelling.  Gastrointestinal: Negative for abdominal pain, blood in stool, diarrhea and nausea.  Endocrine: Negative for cold intolerance, polydipsia, polyphagia and polyuria.  Genitourinary: Negative for dysuria and hematuria.  Musculoskeletal: Negative for arthralgias.  Skin: Negative for rash.  Neurological: Negative for dizziness and headaches.  Psychiatric/Behavioral: Negative for dysphoric mood and sleep disturbance. The patient is not nervous/anxious.     Allergies Patient is allergic to lisinopril-hydrochlorothiazide and penicillins.  Past Medical History Patient  has a past medical history of Anemia (10/15/2016), CKD (chronic kidney disease) stage 3, GFR  30-59 ml/min (09/01/2016), GERD (gastroesophageal reflux disease) (10/15/2016), High cholesterol, Hypertension, LBBB (left bundle branch block) (09/01/2016), Macular degeneration (10/15/2016), Syncope (09/01/2016), and Vitamin D deficiency (10/15/2016).  Surgical History Patient  has no past surgical history on file.  Family History Pateint's family history includes Early death in her brother; Heart disease in her brother, brother, mother, and paternal grandmother; Hypertension in her father.  Social History Patient  reports that she has never smoked. She has never used smokeless tobacco. She reports that she does not drink alcohol and does not use drugs.    Objective: Vitals:   10/22/19 0835  BP: (!) 142/68  Pulse: 75  Temp: 97.9 F (36.6 C)  TempSrc: Temporal  SpO2: 94%  Weight: 152 lb 9.6 oz (69.2 kg)  Height: 5\' 2"  (1.575 m)    Body mass index is 27.91 kg/m.  Physical Exam Vitals reviewed.  Constitutional:      Appearance: Normal appearance. She is well-developed. She is obese.  HENT:     Head: Normocephalic and atraumatic.     Right Ear: Tympanic membrane, ear canal and external ear normal.     Left Ear: Tympanic membrane, ear canal and external ear normal.  Eyes:     Conjunctiva/sclera: Conjunctivae normal.     Pupils: Pupils are equal, round, and reactive to light.  Neck:     Thyroid: No thyromegaly.     Vascular: No carotid bruit.  Cardiovascular:     Rate and Rhythm: Normal rate and regular rhythm.     Pulses: Normal pulses.     Heart sounds: Normal heart sounds. No murmur heard.   Pulmonary:     Effort: Pulmonary effort is normal.     Breath sounds:  Normal breath sounds.  Abdominal:     General: Abdomen is flat. Bowel sounds are normal. There is no distension.     Palpations: Abdomen is soft.     Tenderness: There is no abdominal tenderness.  Musculoskeletal:     Cervical back: Normal range of motion and neck supple.  Lymphadenopathy:     Cervical: No  cervical adenopathy.  Skin:    General: Skin is warm and dry.     Capillary Refill: Capillary refill takes less than 2 seconds.     Findings: No rash.  Neurological:     General: No focal deficit present.     Mental Status: She is alert and oriented to person, place, and time.     Cranial Nerves: No cranial nerve deficit.     Coordination: Coordination normal.     Deep Tendon Reflexes: Reflexes normal.  Psychiatric:        Mood and Affect: Mood normal.        Behavior: Behavior normal.        Assessment/plan: 1. Essential hypertension, on Norvasc and ASA Just slightly above goal. I want her to keep a log at home for me a few times a week. Routine labs and see her back in 3 months time. They are to let me know if BP is consistently >150/90. Continue walking on regular basis.  - CBC with Differential/Platelet  2. Stage 3a chronic kidney disease Routine labs today. Has been stable for quite some time. Discussed we could move her appointment to q 6 months, but she is not ready to do this.  - Comprehensive metabolic panel  3. Prediabetes Has been well controlled for her age. continue routine surveillance.  - Hemoglobin A1c  4. Hair loss  - TSH  This visit occurred during the SARS-CoV-2 public health emergency.  Safety protocols were in place, including screening questions prior to the visit, additional usage of staff PPE, and extensive cleaning of exam room while observing appropriate contact time as indicated for disinfecting solutions.     Return in about 3 months (around 01/22/2020) for labs for kidneys/htn.    Orland Mustard, MD [facility]   10/22/2019

## 2019-10-23 ENCOUNTER — Encounter: Payer: Self-pay | Admitting: Family Medicine

## 2019-11-09 ENCOUNTER — Telehealth: Payer: Self-pay

## 2019-11-09 DIAGNOSIS — I10 Essential (primary) hypertension: Secondary | ICD-10-CM

## 2019-11-09 MED ORDER — PRAVASTATIN SODIUM 20 MG PO TABS: ORAL_TABLET | ORAL | 0 refills | Status: DC

## 2019-11-09 NOTE — Telephone Encounter (Signed)
Patient has lost her pravastatin (PRAVACHOL) 20 MG tablet .  She is asking for another prescription be sent in and that it only be a 30 day supply.

## 2019-11-09 NOTE — Telephone Encounter (Signed)
Sent in for her as requested.  Orland Mustard, MD [facility]

## 2019-11-09 NOTE — Telephone Encounter (Signed)
See below. Thanks  Dr. Artis Flock

## 2019-11-28 ENCOUNTER — Other Ambulatory Visit: Payer: Self-pay | Admitting: Family Medicine

## 2019-11-28 DIAGNOSIS — K219 Gastro-esophageal reflux disease without esophagitis: Secondary | ICD-10-CM

## 2019-11-30 ENCOUNTER — Encounter (INDEPENDENT_AMBULATORY_CARE_PROVIDER_SITE_OTHER): Payer: Medicare Other | Admitting: Ophthalmology

## 2019-11-30 ENCOUNTER — Other Ambulatory Visit: Payer: Self-pay

## 2019-11-30 DIAGNOSIS — H353231 Exudative age-related macular degeneration, bilateral, with active choroidal neovascularization: Secondary | ICD-10-CM

## 2019-11-30 DIAGNOSIS — H43813 Vitreous degeneration, bilateral: Secondary | ICD-10-CM | POA: Diagnosis not present

## 2019-11-30 DIAGNOSIS — H35033 Hypertensive retinopathy, bilateral: Secondary | ICD-10-CM

## 2019-11-30 DIAGNOSIS — I10 Essential (primary) hypertension: Secondary | ICD-10-CM | POA: Diagnosis not present

## 2019-12-15 ENCOUNTER — Other Ambulatory Visit: Payer: Self-pay

## 2019-12-15 ENCOUNTER — Telehealth: Payer: Self-pay | Admitting: Family Medicine

## 2019-12-15 DIAGNOSIS — I10 Essential (primary) hypertension: Secondary | ICD-10-CM

## 2019-12-15 MED ORDER — PRAVASTATIN SODIUM 20 MG PO TABS: ORAL_TABLET | ORAL | 0 refills | Status: DC

## 2019-12-15 NOTE — Telephone Encounter (Signed)
  LAST APPOINTMENT DATE: 11/28/2019   NEXT APPOINTMENT DATE:@10 /18/2021  MEDICATION: pravastatin (PRAVACHOL) 20 MG tablet  PHARMACY: [id number] - [place], [place] - [address] AT [address] OF [address]   COMMENT: Patient is requesting a 30 day supply.

## 2019-12-15 NOTE — Telephone Encounter (Signed)
Sent to pharmacy 

## 2020-01-07 DIAGNOSIS — Z23 Encounter for immunization: Secondary | ICD-10-CM | POA: Diagnosis not present

## 2020-01-13 ENCOUNTER — Telehealth: Payer: Self-pay

## 2020-01-13 ENCOUNTER — Other Ambulatory Visit: Payer: Self-pay | Admitting: Family Medicine

## 2020-01-13 DIAGNOSIS — K219 Gastro-esophageal reflux disease without esophagitis: Secondary | ICD-10-CM

## 2020-01-13 DIAGNOSIS — I10 Essential (primary) hypertension: Secondary | ICD-10-CM

## 2020-01-13 MED ORDER — OMEPRAZOLE 40 MG PO CPDR: DELAYED_RELEASE_CAPSULE | ORAL | 0 refills | Status: DC

## 2020-01-13 MED ORDER — PRAVASTATIN SODIUM 20 MG PO TABS: ORAL_TABLET | ORAL | 0 refills | Status: DC

## 2020-01-13 NOTE — Telephone Encounter (Signed)
MEDICATION: pravastatin (PRAVACHOL) 20 MG tablet  omeprazole (PRILOSEC) 40 MG capsule  PHARMACY:  [id number] - [place], [place] - [address] AT [facility] [address] Phone:  [phone]  Fax:  [phone]       Comments:   **Let patient know to contact pharmacy at the end of the day to make sure medication is ready. **  ** Please notify patient to allow 48-72 hours to process**  **Encourage patient to contact the pharmacy for refills or they can request refills through [facility]**

## 2020-01-13 NOTE — Telephone Encounter (Signed)
ERROR

## 2020-01-13 NOTE — Telephone Encounter (Signed)
Refills sent in

## 2020-01-25 ENCOUNTER — Encounter: Payer: Self-pay | Admitting: Family Medicine

## 2020-01-25 ENCOUNTER — Other Ambulatory Visit: Payer: Self-pay

## 2020-01-25 ENCOUNTER — Ambulatory Visit (INDEPENDENT_AMBULATORY_CARE_PROVIDER_SITE_OTHER): Payer: Medicare Other | Admitting: Family Medicine

## 2020-01-25 VITALS — BP 166/70 | HR 102 | Temp 98.5°F | Ht 62.0 in | Wt 153.6 lb

## 2020-01-25 DIAGNOSIS — I10 Essential (primary) hypertension: Secondary | ICD-10-CM | POA: Diagnosis not present

## 2020-01-25 DIAGNOSIS — D72829 Elevated white blood cell count, unspecified: Secondary | ICD-10-CM

## 2020-01-25 DIAGNOSIS — M542 Cervicalgia: Secondary | ICD-10-CM

## 2020-01-25 LAB — CBC WITH DIFFERENTIAL/PLATELET
Absolute Monocytes: 432 cells/uL (ref 200–950)
Basophils Absolute: 43 cells/uL (ref 0–200)
Basophils Relative: 0.6 %
Eosinophils Absolute: 79 cells/uL (ref 15–500)
Eosinophils Relative: 1.1 %
HCT: 41.3 % (ref 35.0–45.0)
Hemoglobin: 13.3 g/dL (ref 11.7–15.5)
Lymphs Abs: 3499 cells/uL (ref 850–3900)
MCH: 27 pg (ref 27.0–33.0)
MCHC: 32.2 g/dL (ref 32.0–36.0)
MCV: 83.9 fL (ref 80.0–100.0)
MPV: 10.1 fL (ref 7.5–12.5)
Monocytes Relative: 6 %
Neutro Abs: 3146 cells/uL (ref 1500–7800)
Neutrophils Relative %: 43.7 %
Platelets: 328 10*3/uL (ref 140–400)
RBC: 4.92 10*6/uL (ref 3.80–5.10)
RDW: 14.2 % (ref 11.0–15.0)
Total Lymphocyte: 48.6 %
WBC: 7.2 10*3/uL (ref 3.8–10.8)

## 2020-01-25 MED ORDER — AMLODIPINE BESYLATE 10 MG PO TABS: 10.0000 mg | ORAL_TABLET | Freq: Every day | ORAL | 3 refills | Status: DC

## 2020-01-25 NOTE — Patient Instructions (Addendum)
For your neck: your trapezius muscle is very tight. Get over the counter voltaren gel and put on up to four times a day. Would also try a heating pad. We can send you to PT as well. They will call you to set this up. Trying to get one close to your house.   -blood pressure still quite high. I sent in new blood pressure pill. Same medication just a higher dose of 10mg . Take it once a day. I would love it if you could keep a blood pressure log for me so I know what you are reading at home.  If you feel excessively tired, light headed or dizzy I need to know so I can make sure we haven't lowered your blood pressure too much. i'll see you back in one month.   Repeating your cbc to check on your white blood cells that were elevated.    See you soon!  Dr. 

## 2020-01-25 NOTE — Progress Notes (Signed)
Patient: Whitney Davidson MRN: [id number] DOB: [birth year] PCP: Orland Mustard, MD     Subjective:  Chief Complaint  Patient presents with  . Hypertension  . Neck Pain    HPI: The patient is a 86 y.o. female who presents today for HTN. Pt complains of neck and arm pain located on the left side. She says that she has not taken anything for it. She thinks she it happened when reaching to get something from off a shelf, or she also mentioned sleeping on it.  Hypertension: Here for follow up of hypertension.  Currently on norvasc 5mg /day.  Takes medication as prescribed and denies any side effects. Exercise includes chair exercises . Weight has been stable. Denies any chest pain, headaches, shortness of breath, vision changes, swelling in lower extremities. She tells me she eats too much salt. Doesn't really take blood pressure at home. Took medication today.   Left neck pain and arm pain Pain started on the back of the left side of her neck and goes down her left arm. Started about one week ago. If she turns her head to the left she can feel it hurting. She has been doing neck exercises. Pain rated as a 3/10. She has a hard time explaining pain states she has to rub it out. Denies any numbness or tingling. Feels likes it is more muscle related. Denies any weakness in her left arm. She is right handed. She denies any trauma. Just woke up one day and it was hurting. She has not taken any medication or put anything on it.   Elevated white blood cell count -repeat today.   Review of Systems  Constitutional: Negative for chills, fatigue and fever.  HENT: Negative for dental problem, ear pain, hearing loss and trouble swallowing.   Eyes: Negative for visual disturbance.  Respiratory: Negative for cough, chest tightness and shortness of breath.   Cardiovascular: Negative for chest pain, palpitations and leg swelling.  Gastrointestinal: Negative for abdominal pain, blood in stool, diarrhea and nausea.   Endocrine: Negative for cold intolerance, polydipsia, polyphagia and polyuria.  Genitourinary: Negative for dysuria and hematuria.  Musculoskeletal: Positive for neck pain and neck stiffness. Negative for arthralgias and joint swelling.  Skin: Negative for rash.  Neurological: Negative for dizziness and headaches.  Psychiatric/Behavioral: Negative for dysphoric mood and sleep disturbance. The patient is not nervous/anxious.     Allergies Patient is allergic to lisinopril-hydrochlorothiazide and penicillins.  Past Medical History Patient  has a past medical history of Anemia (10/15/2016), CKD (chronic kidney disease) stage 3, GFR 30-59 ml/min (HCC) (09/01/2016), GERD (gastroesophageal reflux disease) (10/15/2016), High cholesterol, Hypertension, LBBB (left bundle branch block) (09/01/2016), Macular degeneration (10/15/2016), Syncope (09/01/2016), and Vitamin D deficiency (10/15/2016).  Surgical History Patient  has no past surgical history on file.  Family History Pateint's family history includes Early death in her brother; Heart disease in her brother, brother, mother, and paternal grandmother; Hypertension in her father.  Social History Patient  reports that she has never smoked. She has never used smokeless tobacco. She reports that she does not drink alcohol and does not use drugs.    Objective: Vitals:   01/25/20 0926 01/25/20 0951  BP: (!) 148/80 (!) 166/70  Pulse: (!) 102   Temp: 98.5 F (36.9 C)   TempSrc: Temporal   SpO2: 98%   Weight: 153 lb 9.6 oz (69.7 kg)   Height: 5\' 2"  (1.575 m)     Body mass index is 28.09 kg/m.  Physical Exam Vitals reviewed.  Constitutional:      Appearance: She is well-developed.  HENT:     Right Ear: External ear normal.     Left Ear: External ear normal.  Eyes:     Conjunctiva/sclera: Conjunctivae normal.     Pupils: Pupils are equal, round, and reactive to light.  Neck:     Thyroid: No thyromegaly.     Comments: She has full  rom in her neck.  Cardiovascular:     Rate and Rhythm: Normal rate and regular rhythm.     Heart sounds: Normal heart sounds. No murmur heard.   Pulmonary:     Effort: Pulmonary effort is normal.     Breath sounds: Normal breath sounds.  Abdominal:     General: Bowel sounds are normal. There is no distension.     Palpations: Abdomen is soft.     Tenderness: There is no abdominal tenderness.  Musculoskeletal:     Cervical back: Normal range of motion and neck supple.     Comments: TTP and tightness over her left superior aspect of her trapezius.   Lymphadenopathy:     Cervical: No cervical adenopathy.  Skin:    General: Skin is warm and dry.     Capillary Refill: Capillary refill takes less than 2 seconds.     Findings: No rash.  Neurological:     General: No focal deficit present.     Mental Status: She is alert and oriented to person, place, and time.     Cranial Nerves: No cranial nerve deficit.     Coordination: Coordination normal.     Deep Tendon Reflexes: Reflexes normal.  Psychiatric:        Behavior: Behavior normal.        Assessment/plan: 1. Essential hypertension, on Norvasc and ASA Still quite a bit above goal. im going to increase her novasc to 10mg /day and want her to keep a home log for me with close f/u in 1 month. Discussed if any signs of hypotension she is to return to 5mg /day and let me know. Close f/u in 1 months time.   2. Leukocytosis, unspecified type Repeat today - CBC with Differential/Platelet; Future  3. Neck pain on left side Appears more musculoskeletal in nature as her trapezius is very tight. Will start voltaren gel, heating pad and referral to PT. No radicular signs or red flags. If not getting better will need to image.     This visit occurred during the SARS-CoV-2 public health emergency.  Safety protocols were in place, including screening questions prior to the visit, additional usage of staff PPE, and extensive cleaning of exam room  while observing appropriate contact time as indicated for disinfecting solutions.     Return in about 1 month (around 02/25/2020) for blood pressure .   , MD [facility]   01/25/2020

## 2020-02-10 ENCOUNTER — Telehealth: Payer: Self-pay

## 2020-02-10 DIAGNOSIS — I10 Essential (primary) hypertension: Secondary | ICD-10-CM

## 2020-02-10 MED ORDER — PRAVASTATIN SODIUM 20 MG PO TABS: ORAL_TABLET | ORAL | 3 refills | Status: DC

## 2020-02-10 NOTE — Telephone Encounter (Signed)
Refilled.  Neha Waight, MD [facility]   

## 2020-02-10 NOTE — Telephone Encounter (Signed)
..   LAST APPOINTMENT DATE: 01/25/2020   NEXT APPOINTMENT DATE:@11 /18/2021  MEDICATION:pravastatin (PRAVACHOL) 20 MG tablet

## 2020-02-17 ENCOUNTER — Other Ambulatory Visit: Payer: Self-pay

## 2020-02-17 ENCOUNTER — Ambulatory Visit: Payer: Medicare Other | Attending: Family Medicine | Admitting: Physical Therapy

## 2020-02-17 ENCOUNTER — Encounter: Payer: Self-pay | Admitting: Physical Therapy

## 2020-02-17 DIAGNOSIS — R252 Cramp and spasm: Secondary | ICD-10-CM

## 2020-02-17 DIAGNOSIS — M542 Cervicalgia: Secondary | ICD-10-CM | POA: Diagnosis not present

## 2020-02-17 NOTE — Patient Instructions (Signed)
Access Code: [email or web address] URL: [email or web address] Date: 02/17/2020 Prepared by: Lysle Rubens  Exercises Seated Cervical Extension AROM - 1 x daily - 7 x weekly - 3 sets - 10 reps Seated Cervical Rotation AROM - 1 x daily - 7 x weekly - 3 sets - 10 reps Seated Gentle Upper Trapezius Stretch - 1 x daily - 7 x weekly - 3 sets - 2 reps - 20-30 sec hold Gentle Levator Scapulae Stretch - 1 x daily - 7 x weekly - 3 sets - 2 reps - 20-30 sec hold Seated Scapular Retraction - 1 x daily - 7 x weekly - 3 sets - 10 reps - 3 sec hold

## 2020-02-17 NOTE — Therapy (Signed)
[facility] [address] [place], [place], [address] Phone: [phone]   Fax:  [phone]  Physical Therapy Evaluation  Patient Details  Name: Whitney Davidson MRN: [id number] Date of Birth: Nov 09, 1930 Referring Provider (PT): Artis Flock   Encounter Date: 02/17/2020   PT End of Session - 02/17/20 1639    Visit Number 1    Date for PT Re-Evaluation 04/18/20    PT Start Time 1601    PT Stop Time 1645    PT Time Calculation (min) 44 min    Activity Tolerance Patient tolerated treatment well    Behavior During Therapy [facility] for tasks assessed/performed           Past Medical History:  Diagnosis Date   Anemia 10/15/2016   CKD (chronic kidney disease) stage 3, GFR 30-59 ml/min (HCC) 09/01/2016   GERD (gastroesophageal reflux disease) 10/15/2016   High cholesterol    Hypertension    LBBB (left bundle branch block) 09/01/2016   Macular degeneration 10/15/2016   Syncope 09/01/2016   Due to dehydration. See ER visit on 09/01/2016.   Vitamin D deficiency 10/15/2016    History reviewed. No pertinent surgical history.  There were no vitals filed for this visit.    Subjective Assessment - 02/17/20 1600    Subjective Pt reports L sided neck pain starting about one month ago. Pt reports pain radiates down to about the wrist. Pt denies N/T, HA, and changes in vision. Pt states lifting and reaching overhead are aggravating factors. Pt states she has been to PT before for her shoulder and that heat, massage, and exercise all helped.    Pertinent History CKD, HTN, DM, osteoporosis    Limitations House hold activities    Diagnostic tests none    Patient Stated Goals get rid of the pain    Currently in Pain? Yes    Pain Score 7     Pain Location Neck    Pain Orientation Left    Pain Descriptors / Indicators Aching;Sharp    Pain Type Acute pain    Pain Radiating Towards L UE    Pain Onset More than a month ago    Pain Frequency  Intermittent    Aggravating Factors  reaching overhead, lifting    Pain Relieving Factors heat, rest              [facility] PT Assessment - 02/17/20 0001      Assessment   Medical Diagnosis L neck pain    Referring Provider (PT) Artis Flock    Hand Dominance Right    Prior Therapy PT for shoulder      Precautions   Precautions None      Restrictions   Weight Bearing Restrictions No      Balance Screen   Has the patient fallen in the past 6 months No    Has the patient had a decrease in activity level because of a fear of falling?  No    Is the patient reluctant to leave their home because of a fear of falling?  No      Home Environment   Additional Comments some housework      Prior Function   Level of Independence Independent    Vocation Retired      Dispensing optician Intact      Posture/Postural Control   Posture/Postural Control Postural limitations    Postural Limitations Rounded Shoulders;Forward head      ROM /  Strength   AROM / PROM / Strength AROM;Strength      AROM   Overall AROM Comments shoulder flex/abd ROM mild limited B; reports of mild L UT pain at end ranges of shoulder AROM    AROM Assessment Site Cervical    Cervical Flexion 55    Cervical Extension 30    Cervical - Right Side Bend 15    Cervical - Left Side Bend 10    Cervical - Right Rotation 55    Cervical - Left Rotation 35      Strength   Overall Strength Comments BUE 4+/5      Palpation   Palpation comment very tender to palpation L UT and L periscapulars; reports of L radiating pain reproduced with deep pressure to L UT      Special Tests    Special Tests Cervical    Cervical Tests Spurling's      Spurling's   Findings Negative    Side Left                      Objective measurements completed on examination: See above findings.       [facility] Adult PT Treatment/Exercise - 02/17/20 0001      Exercises   Exercises Neck      Neck Exercises: Seated    Other Seated Exercise scapular retraction x10      Modalities   Modalities Electrical Stimulation;Moist Heat      Moist Heat Therapy   Number Minutes Moist Heat 10 Minutes    Moist Heat Location Cervical      Electrical Stimulation   Electrical Stimulation Location L cervical/UT    Electrical Stimulation Action IFC    Electrical Stimulation Parameters sitting    Electrical Stimulation Goals Pain      Neck Exercises: Stretches   Upper Trapezius Stretch Right;Left;1 rep;30 seconds    Levator Stretch Right;Left;1 rep;30 seconds    Other Neck Stretches cervical flex/ext and rot AROM                  PT Education - 02/17/20 1639    Education Details Pt educated on POC and HEP    Person(s) Educated Patient;Other (comment)   nephew   Methods Explanation;Demonstration;Handout    Comprehension Verbalized understanding;Returned demonstration            PT Short Term Goals - 02/17/20 1648      PT SHORT TERM GOAL #1   Title Pt will be I with initial HEP    Time 2    Period Weeks    Status New    Target Date 03/02/20             PT Long Term Goals - 02/17/20 1648      PT LONG TERM GOAL #1   Title Pt will be I with advanced HEP    Time 4    Period Weeks    Status New    Target Date 03/16/20      PT LONG TERM GOAL #2   Title Pt will demo L cervical rotation equivalent to R    Time 4    Period Weeks    Status New    Target Date 03/16/20      PT LONG TERM GOAL #3   Title Pt will report resolution of RUE radiating pain    Time 4    Period Weeks    Status New    Target Date  03/16/20      PT LONG TERM GOAL #4   Title Pt will report 50% reduction in L UT pain    Time 4    Period Weeks    Status New    Target Date 03/16/20                  Plan - 02/17/20 1640    Clinical Impression Statement Pt presents to clinic with reports of L sided neck pain with intermittent radiating pain worsening over the past month. Pt denies N/T, new HA, or changes  in vision. Spurling's negative B. Pt radiating pain reproduced with deep pressure to L UT. Pt demos significant tenderness in L UT/periscapulars, limitations in cervical AROM, FHP/rounded shoulders, and weakness of scap stabilizers. Pt would benefit from skilled PT to address the above impairments.    Personal Factors and Comorbidities Comorbidity 3+    Comorbidities DM, HTN, CKD    Examination-Activity Limitations Lift;Carry;Reach Overhead    Examination-Participation Restrictions Cleaning;Meal Prep;Laundry    Stability/Clinical Decision Making Stable/Uncomplicated    Clinical Decision Making Low    Rehab Potential Good    PT Frequency 2x / week    PT Duration 4 weeks    PT Treatment/Interventions ADLs/Self Care Home Management;Electrical Stimulation;Iontophoresis 4mg /ml Dexamethasone;Moist Heat;Neuromuscular re-education;Therapeutic exercise;Therapeutic activities;Patient/family education;Manual techniques;Passive range of motion    PT Next Visit Plan cervical flexibility, scap stab ex's, manual tx and modalities as indicated    PT Home Exercise Plan UT/levator stretch, scap retraction, cervical AROM    Consulted and Agree with Plan of Care Patient;Family member/caregiver    Family Member Consulted nephew           Patient will benefit from skilled therapeutic intervention in order to improve the following deficits and impairments:  Decreased range of motion, Increased muscle spasms, Impaired UE functional use, Pain, Impaired flexibility, Decreased strength, Postural dysfunction  Visit Diagnosis: Neck pain on left side  Cramp and spasm     Problem List Patient Active Problem List   Diagnosis Date Noted   Controlled type 2 diabetes mellitus without complication, without long-term current use of insulin (HCC) 05/14/2019   Localized osteoporosis without current pathological fracture, takes calcium and D, refuses medication 07/19/2017   GERD (gastroesophageal reflux disease), on  Omeprazole 10/15/2016   Macular degeneration 10/15/2016   Vitamin D deficiency 10/15/2016   Essential hypertension, on Norvasc and ASA 09/01/2016   Hyperlipidemia, on Pravastatin 09/01/2016   LBBB (left bundle branch block) 09/01/2016   CKD (chronic kidney disease) stage 3, GFR 30-59 ml/min (HCC) 09/01/2016   09/03/2016, PT, DPT Lysle Rubens Taysean Wager 02/17/2020, 4:51 PM  [facility] [address] [place], [place], [place] Phone: [phone]   Fax:  [phone]  Name: Eulah Walkup MRN: [name] Date of Birth: 11/27/1930

## 2020-02-22 ENCOUNTER — Ambulatory Visit: Payer: Medicare Other | Admitting: Physical Therapy

## 2020-02-22 ENCOUNTER — Other Ambulatory Visit: Payer: Self-pay

## 2020-02-22 ENCOUNTER — Encounter: Payer: Self-pay | Admitting: Physical Therapy

## 2020-02-22 DIAGNOSIS — M542 Cervicalgia: Secondary | ICD-10-CM

## 2020-02-22 DIAGNOSIS — R252 Cramp and spasm: Secondary | ICD-10-CM

## 2020-02-22 NOTE — Therapy (Signed)
[place] [place]. [place], [place], [address] Phone: [phone]   Fax:  [phone]  Physical Therapy Treatment  Patient Details  Name: Whitney Davidson MRN: [id number] Date of Birth: 09-28-1930 Referring Provider (PT): Rogers Blocker   Encounter Date: 02/22/2020   PT End of Session - 02/22/20 1635    Visit Number 2    Date for PT Re-Evaluation 04/18/20    PT Start Time 1600    PT Stop Time 1645    PT Time Calculation (min) 45 min    Activity Tolerance Patient tolerated treatment well    Behavior During Therapy [facility] for tasks assessed/performed           Past Medical History:  Diagnosis Date  . Anemia 10/15/2016  . CKD (chronic kidney disease) stage 3, GFR 30-59 ml/min (HCC) 09/01/2016  . GERD (gastroesophageal reflux disease) 10/15/2016  . High cholesterol   . Hypertension   . LBBB (left bundle branch block) 09/01/2016  . Macular degeneration 10/15/2016  . Syncope 09/01/2016   Due to dehydration. See ER visit on 09/01/2016.  . Vitamin D deficiency 10/15/2016    History reviewed. No pertinent surgical history.  There were no vitals filed for this visit.   Subjective Assessment - 02/22/20 1600    Subjective Pt reports that she has been doing the exercises at home and it has seemed to help    Currently in Pain? No/denies                             [facility] Adult PT Treatment/Exercise - 02/22/20 0001      Neck Exercises: Machines for Strengthening   UBE (Upper Arm Bike) L1 x2 min each       Neck Exercises: Standing   Other Standing Exercises Rows and Ext red 2x10       Neck Exercises: Seated   Neck Retraction 10 reps;3 secs   x2   Other Seated Exercise Rev shoulder rolls 2 x 10, Horiz abd 2x5     Other Seated Exercise Levator stretch x3 eaxh side 5 sec       Modalities   Modalities Electrical Stimulation;Moist Heat      Moist Heat Therapy   Number Minutes Moist Heat 10 Minutes    Moist Heat  Location Cervical      Electrical Stimulation   Electrical Stimulation Location L cervical/UT    Electrical Stimulation Action IFC    Electrical Stimulation Parameters sitting    Electrical Stimulation Goals Pain      Manual Therapy   Manual Therapy Neural Stretch;Passive ROM;Soft tissue mobilization    Manual therapy comments L UT tightness    Soft tissue mobilization upper & mid traps    Passive ROM Cervical spine with end range stretching    Neural Stretch LUE medial nerve glide                     PT Short Term Goals - 02/22/20 1636      PT SHORT TERM GOAL #1   Title Pt will be I with initial HEP    Status Partially Met             PT Long Term Goals - 02/22/20 1636      PT LONG TERM GOAL #1   Title Pt will be I with advanced HEP    Status On-going      PT LONG  TERM GOAL #2   Title Pt will demo L cervical rotation equivalent to R    Status On-going                 Plan - 02/22/20 1637    Clinical Impression Statement Pt reports some improvement from HEP. She was able to complete all of the interventions today. Tactile cues for posture given with standing rows and extensions. She did report some discomfort with horizontal abduction getting shoulders back maintain good posture. She stated she felt a pulling sensation with medial nerve glide.    Personal Factors and Comorbidities Comorbidity 3+;Fitness    Scientist, research (physical sciences);Reach Overhead    Examination-Participation Restrictions Cleaning;Meal Prep;Laundry    PT Frequency 2x / week    PT Duration 4 weeks    PT Treatment/Interventions ADLs/Self Care Home Management;Electrical Stimulation;Iontophoresis 29m/ml Dexamethasone;Moist Heat;Neuromuscular re-education;Therapeutic exercise;Therapeutic activities;Patient/family education;Manual techniques;Passive range of motion    PT Next Visit Plan cervical flexibility, scap stab ex's, manual tx and modalities as indicated            Patient will benefit from skilled therapeutic intervention in order to improve the following deficits and impairments:  Decreased range of motion, Increased muscle spasms, Impaired UE functional use, Pain, Impaired flexibility, Decreased strength, Postural dysfunction  Visit Diagnosis: Cramp and spasm  Neck pain on left side     Problem List Patient Active Problem List   Diagnosis Date Noted  . Controlled type 2 diabetes mellitus without complication, without long-term current use of insulin ([place] 05/14/2019  . Localized osteoporosis without current pathological fracture, takes calcium and D, refuses medication 07/19/2017  . GERD (gastroesophageal reflux disease), on Omeprazole 10/15/2016  . Macular degeneration 10/15/2016  . Vitamin D deficiency 10/15/2016  . Essential hypertension, on Norvasc and ASA 09/01/2016  . Hyperlipidemia, on Pravastatin 09/01/2016  . LBBB (left bundle branch block) 09/01/2016  . CKD (chronic kidney disease) stage 3, GFR 30-59 ml/min (HCC) 09/01/2016    RScot Jun PTA 02/22/2020, 4:40 PM  [place] [address]Phone: [phone]  Fax:  [phone] Name: Whitney KolarikMRN: [id number] of Birth: 309/13/1932

## 2020-02-24 ENCOUNTER — Ambulatory Visit: Payer: Medicare Other | Admitting: Physical Therapy

## 2020-02-24 ENCOUNTER — Other Ambulatory Visit: Payer: Self-pay

## 2020-02-24 ENCOUNTER — Encounter: Payer: Self-pay | Admitting: Physical Therapy

## 2020-02-24 DIAGNOSIS — R252 Cramp and spasm: Secondary | ICD-10-CM | POA: Diagnosis not present

## 2020-02-24 DIAGNOSIS — M542 Cervicalgia: Secondary | ICD-10-CM | POA: Diagnosis not present

## 2020-02-24 NOTE — Therapy (Signed)
[facility] [address]. [place], [place], [address] Phone: [phone]   Fax:  [phone]  Physical Therapy Treatment  Patient Details  Name: Hadleigh Felber MRN: [id number] Date of Birth: 11-Aug-1930 Referring Provider (PT): Artis Flock   Encounter Date: 02/24/2020   PT End of Session - 02/24/20 1649    Visit Number 3    Date for PT Re-Evaluation 04/18/20    PT Start Time 1623    PT Stop Time 1700    PT Time Calculation (min) 37 min    Activity Tolerance Patient tolerated treatment well    Behavior During Therapy [facility] for tasks assessed/performed           Past Medical History:  Diagnosis Date  . Anemia 10/15/2016  . CKD (chronic kidney disease) stage 3, GFR 30-59 ml/min (HCC) 09/01/2016  . GERD (gastroesophageal reflux disease) 10/15/2016  . High cholesterol   . Hypertension   . LBBB (left bundle branch block) 09/01/2016  . Macular degeneration 10/15/2016  . Syncope 09/01/2016   Due to dehydration. See ER visit on 09/01/2016.  . Vitamin D deficiency 10/15/2016    History reviewed. No pertinent surgical history.  There were no vitals filed for this visit.   Subjective Assessment - 02/24/20 1625    Subjective Pt reports that sharp pain in her neck has mostly subsided; feeling better overall    Currently in Pain? Yes    Pain Score 6     Pain Location Neck    Pain Orientation Left                             [facility] Adult PT Treatment/Exercise - 02/24/20 0001      Neck Exercises: Machines for Strengthening   UBE (Upper Arm Bike) L2 x 3 min fwd/bkwd      Neck Exercises: Standing   Other Standing Exercises Rows and Ext red 2x10     Other Standing Exercises shoulder IR/ER red TB 1x10      Neck Exercises: Seated   Other Seated Exercise shoulder abd x10 1#    Other Seated Exercise red ball overhead raise 2x5      Moist Heat Therapy   Number Minutes Moist Heat 10 Minutes    Moist Heat Location Cervical       Electrical Stimulation   Electrical Stimulation Location L cervical/UT    Electrical Stimulation Action IFC    Electrical Stimulation Parameters sitting    Electrical Stimulation Goals Pain      Manual Therapy   Manual Therapy Soft tissue mobilization;Passive ROM    Manual therapy comments L UT tightness    Soft tissue mobilization upper & mid traps    Passive ROM Cervical spine with end range stretching                    PT Short Term Goals - 02/24/20 1651      PT SHORT TERM GOAL #1   Title Pt will be I with initial HEP    Status Achieved             PT Long Term Goals - 02/22/20 1636      PT LONG TERM GOAL #1   Title Pt will be I with advanced HEP    Status On-going      PT LONG TERM GOAL #2   Title Pt will demo L cervical rotation equivalent to R  Status On-going                 Plan - 02/24/20 1649    Clinical Impression Statement Pt arrived 8 min late for rx today. Pt tolerated progression of standing ex's with no increase in cervical pain. Cues for form/posture with standing shoulder IR/ER. Pt had R shoulder pain with seated shoulder abduction. Reports relief with heat and estim.    PT Treatment/Interventions ADLs/Self Care Home Management;Electrical Stimulation;Iontophoresis 4mg /ml Dexamethasone;Moist Heat;Neuromuscular re-education;Therapeutic exercise;Therapeutic activities;Patient/family education;Manual techniques;Passive range of motion    PT Next Visit Plan cervical flexibility, scap stab ex's, manual tx and modalities as indicated    Consulted and Agree with Plan of Care Patient;Family member/caregiver    Family Member Consulted nephew           Patient will benefit from skilled therapeutic intervention in order to improve the following deficits and impairments:  Decreased range of motion, Increased muscle spasms, Impaired UE functional use, Pain, Impaired flexibility, Decreased strength, Postural dysfunction  Visit  Diagnosis: Cramp and spasm  Neck pain on left side     Problem List Patient Active Problem List   Diagnosis Date Noted  . Controlled type 2 diabetes mellitus without complication, without long-term current use of insulin (HCC) 05/14/2019  . Localized osteoporosis without current pathological fracture, takes calcium and D, refuses medication 07/19/2017  . GERD (gastroesophageal reflux disease), on Omeprazole 10/15/2016  . Macular degeneration 10/15/2016  . Vitamin D deficiency 10/15/2016  . Essential hypertension, on Norvasc and ASA 09/01/2016  . Hyperlipidemia, on Pravastatin 09/01/2016  . LBBB (left bundle branch block) 09/01/2016  . CKD (chronic kidney disease) stage 3, GFR 30-59 ml/min (HCC) 09/01/2016   09/03/2016, PT, DPT Lysle Rubens Alera Quevedo 02/24/2020, 4:52 PM  [facility] [address]. [place], [place], [place] Phone: [phone]   Fax:  [phone]  Name: Clorene Nerio MRN: [name] Date of Birth: 07/07/1930

## 2020-02-25 ENCOUNTER — Encounter: Payer: Self-pay | Admitting: Family Medicine

## 2020-02-25 ENCOUNTER — Ambulatory Visit (INDEPENDENT_AMBULATORY_CARE_PROVIDER_SITE_OTHER): Payer: Medicare Other | Admitting: Family Medicine

## 2020-02-25 VITALS — BP 148/60 | HR 92 | Temp 97.8°F | Ht 62.0 in | Wt 152.6 lb

## 2020-02-25 DIAGNOSIS — L989 Disorder of the skin and subcutaneous tissue, unspecified: Secondary | ICD-10-CM

## 2020-02-25 DIAGNOSIS — N183 Chronic kidney disease, stage 3 unspecified: Secondary | ICD-10-CM | POA: Diagnosis not present

## 2020-02-25 DIAGNOSIS — E119 Type 2 diabetes mellitus without complications: Secondary | ICD-10-CM

## 2020-02-25 DIAGNOSIS — L988 Other specified disorders of the skin and subcutaneous tissue: Secondary | ICD-10-CM | POA: Diagnosis not present

## 2020-02-25 DIAGNOSIS — I10 Essential (primary) hypertension: Secondary | ICD-10-CM

## 2020-02-25 NOTE — Progress Notes (Signed)
Patient: Whitney Davidson MRN: [id number] DOB: [birth year] PCP: Orland Mustard, MD     Subjective:  Chief Complaint  Patient presents with  . Hypertension  . Skin Tag    Left side of head, towards the front  . Diabetes  . Chronic Kidney Disease    HPI: The patient is a 86 y.o. female who presents today for HTN. Pt has concerns about skin tag on the left side of her head. She says it is beginning to be a problem when combing her hair. She is requesting removal/ referral to dermatology.  Hypertension: Here for follow up of hypertension.  Currently on norvasc 10mg .  I increased her to 10mg  back in October. She doesn't really keep a log at home, but has a few readings in the 120-150/70-80. Takes medication as prescribed and denies any side effects. Exercise includes walking. Weight has been stable. Denies any chest pain, headaches, shortness of breath, vision changes, swelling in lower extremities.    CKD Will recheck labs today. Baseline was around 1.27-1.31, but last lab check creatinine was normal. No nephro toxic drugs, avoids NSAIDs.   Diabetes type 2 Last a1c was 6.5. she is diet controlled and doing well.   Also is on daily aspirin. Unsure why she is on this.   She also has a little skin tag on the left side of her head. When she combs her hair it gets in the way and she would like it removed. It has not grown. She thinks it has been there for a year. Denies any bleeding, color change, pain.     Review of Systems  Constitutional: Negative for chills, fatigue and fever.  HENT: Negative for dental problem, ear pain, hearing loss and trouble swallowing.   Eyes: Negative for visual disturbance.  Respiratory: Negative for cough, chest tightness and shortness of breath.   Cardiovascular: Negative for chest pain, palpitations and leg swelling.  Gastrointestinal: Negative for abdominal pain, blood in stool, diarrhea and nausea.  Endocrine: Negative for cold intolerance, polydipsia,  polyphagia and polyuria.  Genitourinary: Negative for dysuria and hematuria.  Musculoskeletal: Negative for arthralgias.  Skin: Negative for rash.       Lesion on head   Neurological: Negative for dizziness, light-headedness and headaches.  Psychiatric/Behavioral: Negative for dysphoric mood and sleep disturbance. The patient is not nervous/anxious.     Allergies Patient is allergic to lisinopril-hydrochlorothiazide and penicillins.  Past Medical History Patient  has a past medical history of Anemia (10/15/2016), CKD (chronic kidney disease) stage 3, GFR 30-59 ml/min (HCC) (09/01/2016), GERD (gastroesophageal reflux disease) (10/15/2016), High cholesterol, Hypertension, LBBB (left bundle branch block) (09/01/2016), Macular degeneration (10/15/2016), Syncope (09/01/2016), and Vitamin D deficiency (10/15/2016).  Surgical History Patient  has no past surgical history on file.  Family History Pateint's family history includes Early death in her brother; Heart disease in her brother, brother, mother, and paternal grandmother; Hypertension in her father.  Social History Patient  reports that she has never smoked. She has never used smokeless tobacco. She reports that she does not drink alcohol and does not use drugs.    Objective: Vitals:   02/25/20 0800 02/25/20 0833  BP: (!) 162/72 (!) 148/60  Pulse: 92   Temp: 97.8 F (36.6 C)   TempSrc: Temporal   SpO2: 99%   Weight: 152 lb 9.6 oz (69.2 kg)   Height: 5\' 2"  (1.575 m)     Body mass index is 27.91 kg/m.  Physical Exam Vitals reviewed.  Constitutional:  Appearance: Normal appearance. She is well-developed. She is obese.  HENT:     Head: Normocephalic and atraumatic.     Right Ear: External ear normal.     Left Ear: External ear normal.  Eyes:     Conjunctiva/sclera: Conjunctivae normal.     Pupils: Pupils are equal, round, and reactive to light.  Neck:     Thyroid: No thyromegaly.     Vascular: No carotid bruit.   Cardiovascular:     Rate and Rhythm: Normal rate and regular rhythm.     Heart sounds: Normal heart sounds. No murmur heard.   Pulmonary:     Effort: Pulmonary effort is normal.     Breath sounds: Normal breath sounds.  Abdominal:     General: Bowel sounds are normal. There is no distension.     Palpations: Abdomen is soft.     Tenderness: There is no abdominal tenderness.  Musculoskeletal:     Cervical back: Normal range of motion and neck supple.  Lymphadenopathy:     Cervical: No cervical adenopathy.  Skin:    General: Skin is warm and dry.     Findings: Lesion (keratotic growth on left side of scalp. flesh colored. hard and growing upward. no necrotic center. ) present. No rash.  Neurological:     General: No focal deficit present.     Mental Status: She is alert and oriented to person, place, and time.     Cranial Nerves: No cranial nerve deficit.     Coordination: Coordination normal.     Deep Tendon Reflexes: Reflexes normal.  Psychiatric:        Mood and Affect: Mood normal.        Behavior: Behavior normal.    Procedure note Verbal consent obtained. 2 cycles of light freeze done to lesion on scalp. Tolerated well. Wound care given for cryotherapy.     Assessment/plan: 1. Essential hypertension, on Norvasc and ASA No problems with increased norvasc dose. Blood pressure much better today. Home readings are better and more to goal. Would ideally like her less than 140/80, but at 89 im happy with this reading and do not want to drop her too low. Really advised she keep a better log for me. Will see her back in 6 months routine labs today.  Patient counseling [x]    Nutrition: Stressed importance of moderation in sodium/caffeine intake, saturated fat and cholesterol, caloric balance, sufficient intake of fresh fruits, vegetables, fiber, calcium, iron, and 1 mg of folate supplement per day (for females capable of pregnancy).  [x]    Stressed the importance of regular exercise.    []    Substance Abuse: Discussed cessation/primary prevention of tobacco, alcohol, or other drug use; driving or other dangerous activities under the influence; availability of treatment for abuse.   [x]    Injury prevention: Discussed safety belts, safety helmets, smoke detector, smoking near bedding or upholstery.   [x]    Sexuality: Discussed sexually transmitted diseases, partner selection, use of condoms, avoidance of unintended pregnancy  and contraceptive alternatives.  [x]    Dental health: Discussed importance of regular tooth brushing, flossing, and dental visits.  [x]    Health maintenance and immunizations reviewed. Please refer to Health maintenance section.    - CBC with Differential/Platelet; Future - CBC with Differential/Platelet  2. Controlled type 2 diabetes mellitus without complication, without long-term current use of insulin (HCC) Has been well controlled on diet alone. utd on eye exam. Will do foot exam at next visit in 6 months.  On statin, has had pneumonia and covid shots.  - Hemoglobin A1c; Future - Hemoglobin A1c  3. Stage 3 chronic kidney disease, unspecified whether stage 3a or 3b CKD (HCC)  - COMPLETE METABOLIC PANEL WITH GFR; Future - COMPLETE METABOLIC PANEL WITH GFR  4. Skin lesion of scalp ? If keratoacanthoma. If still present in 6 months or changes in next 6 months will shave off and send for path.   Stopping aspirin. No indication for daily use.   This visit occurred during the SARS-CoV-2 public health emergency.  Safety protocols were in place, including screening questions prior to the visit, additional usage of staff PPE, and extensive cleaning of exam room while observing appropriate contact time as indicated for disinfecting solutions.     Return in about 6 months (around 08/24/2020) for fasting labs/htn/chol/diab. check skin tag on head .     Orland Mustard, MD [facility]   02/25/2020

## 2020-02-25 NOTE — Patient Instructions (Addendum)
-  for your hair.Marland KitchenMarland Kitchen 1) looks dry and flaky. Can dry a dandruff shampoo called nizoral and leave on scalp x 5 minutes then rinse. Use nightly for 7 days then can try twice a week. Tea tree oil shampoo can be helpful as well. Avoid harsh chemicals.   2) blood pressure much better on repeat. Still would like it lower than 140, but keep your log at home. Routine labs today.   3) spot on your head looks similar to a keratoacanthoma. These typically grow. Will freeze today, but if it returns I will shave off and send for biopsy.   4) checking your labs for diabetes  5) STOP aspirin. No reason for you to take it.   Ill see you back in 6 months! Come fasting at that time!

## 2020-02-26 LAB — CBC WITH DIFFERENTIAL/PLATELET
Absolute Monocytes: 374 cells/uL (ref 200–950)
Basophils Absolute: 43 cells/uL (ref 0–200)
Basophils Relative: 0.6 %
Eosinophils Absolute: 108 cells/uL (ref 15–500)
Eosinophils Relative: 1.5 %
HCT: 41.5 % (ref 35.0–45.0)
Hemoglobin: 13.3 g/dL (ref 11.7–15.5)
Lymphs Abs: 3326 cells/uL (ref 850–3900)
MCH: 27.1 pg (ref 27.0–33.0)
MCHC: 32 g/dL (ref 32.0–36.0)
MCV: 84.7 fL (ref 80.0–100.0)
MPV: 10.3 fL (ref 7.5–12.5)
Monocytes Relative: 5.2 %
Neutro Abs: 3348 cells/uL (ref 1500–7800)
Neutrophils Relative %: 46.5 %
Platelets: 338 10*3/uL (ref 140–400)
RBC: 4.9 10*6/uL (ref 3.80–5.10)
RDW: 13.8 % (ref 11.0–15.0)
Total Lymphocyte: 46.2 %
WBC: 7.2 10*3/uL (ref 3.8–10.8)

## 2020-02-26 LAB — COMPLETE METABOLIC PANEL WITH GFR
AG Ratio: 1.3 (calc) (ref 1.0–2.5)
ALT: 12 U/L (ref 6–29)
AST: 14 U/L (ref 10–35)
Albumin: 4 g/dL (ref 3.6–5.1)
Alkaline phosphatase (APISO): 87 U/L (ref 37–153)
BUN/Creatinine Ratio: 13 (calc) (ref 6–22)
BUN: 16 mg/dL (ref 7–25)
CO2: 28 mmol/L (ref 20–32)
Calcium: 9.6 mg/dL (ref 8.6–10.4)
Chloride: 103 mmol/L (ref 98–110)
Creat: 1.26 mg/dL — ABNORMAL HIGH (ref 0.60–0.88)
GFR, Est African American: 44 mL/min/{1.73_m2} — ABNORMAL LOW (ref >=60)
GFR, Est Non African American: 38 mL/min/{1.73_m2} — ABNORMAL LOW (ref >=60)
Globulin: 3.2 g/dL (calc) (ref 1.9–3.7)
Glucose, Bld: 108 mg/dL — ABNORMAL HIGH (ref 65–99)
Potassium: 4.5 mmol/L (ref 3.5–5.3)
Sodium: 141 mmol/L (ref 135–146)
Total Bilirubin: 0.5 mg/dL (ref 0.2–1.2)
Total Protein: 7.2 g/dL (ref 6.1–8.1)

## 2020-02-26 LAB — HEMOGLOBIN A1C
Hgb A1c MFr Bld: 6.5 % of total Hgb — ABNORMAL HIGH (ref <5.7)
Mean Plasma Glucose: 140 (calc)
eAG (mmol/L): 7.7 (calc)

## 2020-02-29 ENCOUNTER — Encounter (INDEPENDENT_AMBULATORY_CARE_PROVIDER_SITE_OTHER): Payer: Medicare Other | Admitting: Ophthalmology

## 2020-02-29 ENCOUNTER — Other Ambulatory Visit: Payer: Self-pay

## 2020-02-29 DIAGNOSIS — H353231 Exudative age-related macular degeneration, bilateral, with active choroidal neovascularization: Secondary | ICD-10-CM | POA: Diagnosis not present

## 2020-02-29 DIAGNOSIS — H35033 Hypertensive retinopathy, bilateral: Secondary | ICD-10-CM | POA: Diagnosis not present

## 2020-02-29 DIAGNOSIS — I10 Essential (primary) hypertension: Secondary | ICD-10-CM | POA: Diagnosis not present

## 2020-02-29 DIAGNOSIS — H43813 Vitreous degeneration, bilateral: Secondary | ICD-10-CM

## 2020-03-01 ENCOUNTER — Encounter: Payer: Self-pay | Admitting: Physical Therapy

## 2020-03-01 ENCOUNTER — Ambulatory Visit: Payer: Medicare Other | Admitting: Physical Therapy

## 2020-03-01 DIAGNOSIS — R252 Cramp and spasm: Secondary | ICD-10-CM

## 2020-03-01 DIAGNOSIS — M542 Cervicalgia: Secondary | ICD-10-CM

## 2020-03-01 NOTE — Therapy (Signed)
[facility] [address] [place], [place], [address] Phone: [phone]   Fax:  [phone]  Physical Therapy Treatment  Patient Details  Name: Whitney Davidson MRN: [id number] Date of Birth: December 27, 1930 Referring Provider (PT): Artis Flock   Encounter Date: 03/01/2020   PT End of Session - 03/01/20 1640    Visit Number 4    Date for PT Re-Evaluation 04/18/20    PT Start Time 1611    PT Stop Time 1652    PT Time Calculation (min) 41 min    Activity Tolerance Patient tolerated treatment well    Behavior During Therapy [facility] for tasks assessed/performed           Past Medical History:  Diagnosis Date   Anemia 10/15/2016   CKD (chronic kidney disease) stage 3, GFR 30-59 ml/min (HCC) 09/01/2016   GERD (gastroesophageal reflux disease) 10/15/2016   High cholesterol    Hypertension    LBBB (left bundle branch block) 09/01/2016   Macular degeneration 10/15/2016   Syncope 09/01/2016   Due to dehydration. See ER visit on 09/01/2016.   Vitamin D deficiency 10/15/2016    History reviewed. No pertinent surgical history.  There were no vitals filed for this visit.   Subjective Assessment - 03/01/20 1611    Subjective Pt reports a little stiff on L side of neck; no pain    Currently in Pain? No/denies    Pain Score 0-No pain                             [facility] Adult PT Treatment/Exercise - 03/01/20 0001      Neck Exercises: Machines for Strengthening   UBE (Upper Arm Bike) L2 x 3 min fwd/bkwd      Neck Exercises: Standing   Other Standing Exercises Rows and Ext red 2x10       Neck Exercises: Seated   Cervical Isometrics Flexion;Extension;Right lateral flexion;Left lateral flexion;Right rotation;Left rotation;3 secs;10 reps    Neck Retraction 3 secs;20 reps      Moist Heat Therapy   Number Minutes Moist Heat 12 Minutes    Moist Heat Location Cervical      Electrical Stimulation   Electrical Stimulation  Location L cervical/UT    Electrical Stimulation Action IFC    Electrical Stimulation Parameters sitting    Electrical Stimulation Goals Pain      Manual Therapy   Manual Therapy Soft tissue mobilization;Passive ROM    Manual therapy comments L UT tightness    Soft tissue mobilization upper & mid traps    Passive ROM Cervical spine with end range stretching      Neck Exercises: Stretches   Upper Trapezius Stretch Right;Left;1 rep;30 seconds    Levator Stretch Right;Left;1 rep;30 seconds                    PT Short Term Goals - 02/24/20 1651      PT SHORT TERM GOAL #1   Title Pt will be I with initial HEP    Status Achieved             PT Long Term Goals - 02/22/20 1636      PT LONG TERM GOAL #1   Title Pt will be I with advanced HEP    Status On-going      PT LONG TERM GOAL #2   Title Pt will demo L cervical rotation equivalent to R  Status On-going                 Plan - 03/01/20 1641    Clinical Impression Statement Focused on cervical ROM and flexibility this rx. Pt very slow and guarded with cervical ROM. Limited mobility of upper cervical spine. Tactile and verbal cues for form with standing rows/extensions. Pt reports relief with heat and estim.    PT Treatment/Interventions ADLs/Self Care Home Management;Electrical Stimulation;Iontophoresis 4mg /ml Dexamethasone;Moist Heat;Neuromuscular re-education;Therapeutic exercise;Therapeutic activities;Patient/family education;Manual techniques;Passive range of motion    PT Next Visit Plan cervical flexibility, scap stab ex's, manual tx and modalities as indicated    Consulted and Agree with Plan of Care Patient;Family member/caregiver    Family Member Consulted nephew           Patient will benefit from skilled therapeutic intervention in order to improve the following deficits and impairments:  Decreased range of motion, Increased muscle spasms, Impaired UE functional use, Pain, Impaired  flexibility, Decreased strength, Postural dysfunction  Visit Diagnosis: Cramp and spasm  Neck pain on left side     Problem List Patient Active Problem List   Diagnosis Date Noted   Controlled type 2 diabetes mellitus without complication, without long-term current use of insulin (HCC) 05/14/2019   Localized osteoporosis without current pathological fracture, takes calcium and D, refuses medication 07/19/2017   GERD (gastroesophageal reflux disease), on Omeprazole 10/15/2016   Macular degeneration 10/15/2016   Vitamin D deficiency 10/15/2016   Essential hypertension 09/01/2016   Hyperlipidemia, on Pravastatin 09/01/2016   LBBB (left bundle branch block) 09/01/2016   CKD (chronic kidney disease) stage 3, GFR 30-59 ml/min (HCC) 09/01/2016   09/03/2016, PT, DPT Lysle Rubens Lanasia Porras 03/01/2020, 4:43 PM  [facility] [address] [place], [place], [place] Phone: [phone]   Fax:  [phone]  Name: Whitney Davidson MRN: [name] Date of Birth: 08-May-1930

## 2020-03-02 ENCOUNTER — Ambulatory Visit: Payer: Medicare Other | Admitting: Physical Therapy

## 2020-03-07 ENCOUNTER — Other Ambulatory Visit: Payer: Self-pay

## 2020-03-07 ENCOUNTER — Ambulatory Visit: Payer: Medicare Other | Admitting: Physical Therapy

## 2020-03-07 ENCOUNTER — Encounter: Payer: Self-pay | Admitting: Physical Therapy

## 2020-03-07 DIAGNOSIS — M542 Cervicalgia: Secondary | ICD-10-CM

## 2020-03-07 DIAGNOSIS — R252 Cramp and spasm: Secondary | ICD-10-CM

## 2020-03-07 NOTE — Therapy (Signed)
[facility] [address] [place], [place], [address] Phone: [phone]   Fax:  [phone]  Physical Therapy Treatment  Patient Details  Name: Whitney Davidson MRN: [id number] Date of Birth: 1930/07/22 Referring Provider (PT): Artis Flock   Encounter Date: 03/07/2020   PT End of Session - 03/07/20 1649    Visit Number 5    Date for PT Re-Evaluation 04/18/20    PT Start Time 1607    PT Stop Time 1654    PT Time Calculation (min) 47 min    Activity Tolerance Patient tolerated treatment well    Behavior During Therapy [name] for tasks assessed/performed           Past Medical History:  Diagnosis Date  . Anemia 10/15/2016  . CKD (chronic kidney disease) stage 3, GFR 30-59 ml/min (HCC) 09/01/2016  . GERD (gastroesophageal reflux disease) 10/15/2016  . High cholesterol   . Hypertension   . LBBB (left bundle branch block) 09/01/2016  . Macular degeneration 10/15/2016  . Syncope 09/01/2016   Due to dehydration. See ER visit on 09/01/2016.  . Vitamin D deficiency 10/15/2016    History reviewed. No pertinent surgical history.  There were no vitals filed for this visit.   Subjective Assessment - 03/07/20 1611    Subjective Pt reports neck is feeling much better today; states no pain but rates soreness at 6/10.    Currently in Pain? No/denies              [facility] PT Assessment - 03/07/20 0001      AROM   Cervical Flexion 50    Cervical Extension 50    Cervical - Right Side Bend 15    Cervical - Left Side Bend 20    Cervical - Right Rotation 50    Cervical - Left Rotation 45                         [facility] Adult PT Treatment/Exercise - 03/07/20 0001      Neck Exercises: Machines for Strengthening   UBE (Upper Arm Bike) L2 x 3 min fwd/bkwd      Neck Exercises: Standing   Other Standing Exercises Rows and Ext red 2x10     Other Standing Exercises shoulder IR/ER red TB 1x10      Manual Therapy   Manual Therapy  Soft tissue mobilization;Passive ROM    Manual therapy comments L UT tightness    Soft tissue mobilization upper & mid traps    Passive ROM Cervical spine with end range stretching      Neck Exercises: Stretches   Upper Trapezius Stretch Right;Left;1 rep;30 seconds    Levator Stretch Right;Left;1 rep;30 seconds                    PT Short Term Goals - 02/24/20 1651      PT SHORT TERM GOAL #1   Title Pt will be I with initial HEP    Status Achieved             PT Long Term Goals - 02/22/20 1636      PT LONG TERM GOAL #1   Title Pt will be I with advanced HEP    Status On-going      PT LONG TERM GOAL #2   Title Pt will demo L cervical rotation equivalent to R    Status On-going  Plan - 03/07/20 1649    Clinical Impression Statement Pt tolerated progression of TE well; cues for form with standing rows/extension. Pt demos improved cervical AROM from time of eval with reports of reduced pain. Still very slow movements with some pain reported with end range extension. Reports signficant improvement in UT pain. Potential for d/c next rx with updated HEP.    PT Treatment/Interventions ADLs/Self Care Home Management;Electrical Stimulation;Iontophoresis 4mg /ml Dexamethasone;Moist Heat;Neuromuscular re-education;Therapeutic exercise;Therapeutic activities;Patient/family education;Manual techniques;Passive range of motion    PT Next Visit Plan cervical flexibility, scap stab ex's, manual tx and modalities as indicated    Consulted and Agree with Plan of Care Patient;Family member/caregiver    Family Member Consulted nephew           Patient will benefit from skilled therapeutic intervention in order to improve the following deficits and impairments:  Decreased range of motion, Increased muscle spasms, Impaired UE functional use, Pain, Impaired flexibility, Decreased strength, Postural dysfunction  Visit Diagnosis: Cramp and spasm  Neck pain on  left side     Problem List Patient Active Problem List   Diagnosis Date Noted  . Controlled type 2 diabetes mellitus without complication, without long-term current use of insulin (HCC) 05/14/2019  . Localized osteoporosis without current pathological fracture, takes calcium and D, refuses medication 07/19/2017  . GERD (gastroesophageal reflux disease), on Omeprazole 10/15/2016  . Macular degeneration 10/15/2016  . Vitamin D deficiency 10/15/2016  . Essential hypertension 09/01/2016  . Hyperlipidemia, on Pravastatin 09/01/2016  . LBBB (left bundle branch block) 09/01/2016  . CKD (chronic kidney disease) stage 3, GFR 30-59 ml/min (HCC) 09/01/2016   09/03/2016, PT, DPT Whitney Davidson Whitney Davidson 03/07/2020, 4:51 PM  [facility] [address] [place], [place], [place] Phone: [phone]   Fax:  [phone]  Name: Whitney Davidson MRN: Whitney Davidson Date of Birth: 1931/02/28

## 2020-03-09 ENCOUNTER — Encounter: Payer: Self-pay | Admitting: Physical Therapy

## 2020-03-09 ENCOUNTER — Other Ambulatory Visit: Payer: Self-pay

## 2020-03-09 ENCOUNTER — Ambulatory Visit: Payer: Medicare Other | Attending: Family Medicine | Admitting: Physical Therapy

## 2020-03-09 DIAGNOSIS — M542 Cervicalgia: Secondary | ICD-10-CM | POA: Insufficient documentation

## 2020-03-09 DIAGNOSIS — R252 Cramp and spasm: Secondary | ICD-10-CM | POA: Insufficient documentation

## 2020-03-09 NOTE — Therapy (Signed)
[place]. [place], [place], [address] Phone: [phone]   Fax:  [phone]  Physical Therapy Treatment  Patient Details  Name: Joann Jorge MRN: [id number] Date of Birth: 04/28/1930 Referring Provider (PT): Jac Canavan Date: 03/09/2020   PT End of Session - 03/09/20 1700    Visit Number 6    Date for PT Re-Evaluation 04/18/20    PT Start Time 1619    PT Stop Time 3009    PT Time Calculation (min) 39 min    Activity Tolerance Patient tolerated treatment well    Behavior During Therapy [facility] for tasks assessed/performed           Past Medical History:  Diagnosis Date  . Anemia 10/15/2016  . CKD (chronic kidney disease) stage 3, GFR 30-59 ml/min (HCC) 09/01/2016  . GERD (gastroesophageal reflux disease) 10/15/2016  . High cholesterol   . Hypertension   . LBBB (left bundle branch block) 09/01/2016  . Macular degeneration 10/15/2016  . Syncope 09/01/2016   Due to dehydration. See ER visit on 09/01/2016.  . Vitamin D deficiency 10/15/2016    History reviewed. No pertinent surgical history.  There were no vitals filed for this visit.   Subjective Assessment - 03/09/20 1622    Subjective Pt reports neck is feeling much better; no pain today. Would like to discharge and continue HEP independently.    Currently in Pain? No/denies                             [facility] Adult PT Treatment/Exercise - 03/09/20 0001      Neck Exercises: Machines for Strengthening   UBE (Upper Arm Bike) L2 x 3 min fwd/bkwd    Nustep L4 x 5 min      Neck Exercises: Standing   Other Standing Exercises Rows and Ext red 2x10       Manual Therapy   Manual Therapy Soft tissue mobilization;Passive ROM    Manual therapy comments L UT tightness    Soft tissue mobilization upper & mid traps    Passive ROM Cervical spine with end range stretching      Neck Exercises: Stretches   Upper Trapezius Stretch  Right;Left;1 rep;30 seconds    Levator Stretch Right;Left;1 rep;30 seconds    Other Neck Stretches cervical flex/ext and rot AROM                    PT Short Term Goals - 02/24/20 1651      PT SHORT TERM GOAL #1   Title Pt will be I with initial HEP    Status Achieved             PT Long Term Goals - 03/09/20 1630      PT LONG TERM GOAL #1   Title Pt will be I with advanced HEP    Status Achieved      PT LONG TERM GOAL #2   Title Pt will demo L cervical rotation equivalent to R    Baseline L: 45 deg, R: 50 deg    Status Partially Met      PT LONG TERM GOAL #3   Title Pt will report resolution of RUE radiating pain    Status Achieved      PT LONG TERM GOAL #4   Title Pt will report 50% reduction in L UT pain    Status Achieved  Plan - 03/09/20 1701    Clinical Impression Statement Pt recommended for discharge today; has met most LTG with exception of cervical rotation goal. Instructed in continuance of HEP and prescribed lumbar/thoracic stretching exercises. Pt educated on when to return if symptoms recur with pt VU and agreement.    PT Next Visit Plan recommended for d/c with updated HEP    Consulted and Agree with Plan of Care Patient;Family member/caregiver           Patient will benefit from skilled therapeutic intervention in order to improve the following deficits and impairments:     Visit Diagnosis: Cramp and spasm  Neck pain on left side     Problem List Patient Active Problem List   Diagnosis Date Noted  . Controlled type 2 diabetes mellitus without complication, without long-term current use of insulin ([place]) 05/14/2019  . Localized osteoporosis without current pathological fracture, takes calcium and D, refuses medication 07/19/2017  . GERD (gastroesophageal reflux disease), on Omeprazole 10/15/2016  . Macular degeneration 10/15/2016  . Vitamin D deficiency 10/15/2016  . Essential hypertension 09/01/2016  .  Hyperlipidemia, on Pravastatin 09/01/2016  . LBBB (left bundle branch block) 09/01/2016  . CKD (chronic kidney disease) stage 3, GFR 30-59 ml/min ([place]) 09/01/2016   Amador Cunas, PT, DPT Donald Prose Linden Mikes 03/09/2020, 5:04 PM  [place]. [place], [place], [address] Phone: [phone]   Fax:  [phone]  Name: Treina Arscott MRN: [id number] Date of Birth: 01-03-1931

## 2020-05-24 ENCOUNTER — Encounter (INDEPENDENT_AMBULATORY_CARE_PROVIDER_SITE_OTHER): Payer: Medicare Other | Admitting: Ophthalmology

## 2020-05-24 ENCOUNTER — Other Ambulatory Visit: Payer: Self-pay

## 2020-05-24 DIAGNOSIS — I10 Essential (primary) hypertension: Secondary | ICD-10-CM | POA: Diagnosis not present

## 2020-05-24 DIAGNOSIS — H35033 Hypertensive retinopathy, bilateral: Secondary | ICD-10-CM

## 2020-05-24 DIAGNOSIS — H43813 Vitreous degeneration, bilateral: Secondary | ICD-10-CM | POA: Diagnosis not present

## 2020-05-24 DIAGNOSIS — H353231 Exudative age-related macular degeneration, bilateral, with active choroidal neovascularization: Secondary | ICD-10-CM | POA: Diagnosis not present

## 2020-07-15 ENCOUNTER — Encounter: Payer: Self-pay | Admitting: Family Medicine

## 2020-07-15 ENCOUNTER — Other Ambulatory Visit: Payer: Self-pay

## 2020-07-15 ENCOUNTER — Ambulatory Visit (INDEPENDENT_AMBULATORY_CARE_PROVIDER_SITE_OTHER): Payer: Medicare Other | Admitting: Family Medicine

## 2020-07-15 VITALS — BP 142/70 | HR 88 | Temp 97.6°F | Ht 62.0 in | Wt 154.8 lb

## 2020-07-15 DIAGNOSIS — H6121 Impacted cerumen, right ear: Secondary | ICD-10-CM

## 2020-07-15 DIAGNOSIS — E782 Mixed hyperlipidemia: Secondary | ICD-10-CM

## 2020-07-15 DIAGNOSIS — I10 Essential (primary) hypertension: Secondary | ICD-10-CM

## 2020-07-15 DIAGNOSIS — N183 Chronic kidney disease, stage 3 unspecified: Secondary | ICD-10-CM

## 2020-07-15 DIAGNOSIS — E119 Type 2 diabetes mellitus without complications: Secondary | ICD-10-CM

## 2020-07-15 LAB — CBC WITH DIFFERENTIAL/PLATELET
Basophils Absolute: 0 10*3/uL (ref 0.0–0.1)
Basophils Relative: 0.4 % (ref 0.0–3.0)
Eosinophils Absolute: 0.1 10*3/uL (ref 0.0–0.7)
Eosinophils Relative: 1.3 % (ref 0.0–5.0)
HCT: 39.9 % (ref 36.0–46.0)
Hemoglobin: 13.3 g/dL (ref 12.0–15.0)
Lymphocytes Relative: 43.8 % (ref 12.0–46.0)
Lymphs Abs: 2.9 10*3/uL (ref 0.7–4.0)
MCHC: 33.3 g/dL (ref 30.0–36.0)
MCV: 83.3 fl (ref 78.0–100.0)
Monocytes Absolute: 0.4 10*3/uL (ref 0.1–1.0)
Monocytes Relative: 5.9 % (ref 3.0–12.0)
Neutro Abs: 3.2 10*3/uL (ref 1.4–7.7)
Neutrophils Relative %: 48.6 % (ref 43.0–77.0)
Platelets: 287 10*3/uL (ref 150.0–400.0)
RBC: 4.79 Mil/uL (ref 3.87–5.11)
RDW: 14.4 % (ref 11.5–15.5)
WBC: 6.7 10*3/uL (ref 4.0–10.5)

## 2020-07-15 LAB — MICROALBUMIN / CREATININE URINE RATIO
Creatinine,U: 84 mg/dL
Microalb Creat Ratio: 1.9 mg/g (ref 0.0–30.0)
Microalb, Ur: 1.6 mg/dL (ref 0.0–1.9)

## 2020-07-15 LAB — LIPID PANEL
Cholesterol: 170 mg/dL (ref 0–200)
HDL: 57.9 mg/dL (ref >39.00)
LDL Cholesterol: 91 mg/dL (ref 0–99)
NonHDL: 112.52
Total CHOL/HDL Ratio: 3
Triglycerides: 108 mg/dL (ref 0.0–149.0)
VLDL: 21.6 mg/dL (ref 0.0–40.0)

## 2020-07-15 LAB — COMPREHENSIVE METABOLIC PANEL
ALT: 12 U/L (ref 0–35)
AST: 13 U/L (ref 0–37)
Albumin: 4.1 g/dL (ref 3.5–5.2)
Alkaline Phosphatase: 97 U/L (ref 39–117)
BUN: 17 mg/dL (ref 6–23)
CO2: 30 mEq/L (ref 19–32)
Calcium: 9.3 mg/dL (ref 8.4–10.5)
Chloride: 102 mEq/L (ref 96–112)
Creatinine, Ser: 1.26 mg/dL — ABNORMAL HIGH (ref 0.40–1.20)
GFR: 38.24 mL/min — ABNORMAL LOW (ref >60.00)
Glucose, Bld: 96 mg/dL (ref 70–99)
Potassium: 4.5 mEq/L (ref 3.5–5.1)
Sodium: 139 mEq/L (ref 135–145)
Total Bilirubin: 0.5 mg/dL (ref 0.2–1.2)
Total Protein: 7.1 g/dL (ref 6.0–8.3)

## 2020-07-15 LAB — HEMOGLOBIN A1C: Hgb A1c MFr Bld: 6.7 % — ABNORMAL HIGH (ref 4.6–6.5)

## 2020-07-15 NOTE — Patient Instructions (Signed)
-  doing great. I would stay on norvasc 10mg  since blood pressure so well controlled and swelling is very minimal. Let know though if gets worse.   -routine labs today  -will flush out ears. Can also try over the counter debrox.   See you back in 6 months time. 88 looks so good on you!   Dr. [place]

## 2020-07-15 NOTE — Progress Notes (Addendum)
Patient: Whitney Davidson MRN: [id number] DOB: 12-Jun-1930 PCP: Orland Mustard, MD     Subjective:  Chief Complaint  Patient presents with  . Hypertension  . Hyperlipidemia  . Diabetes  . Leg Swelling    Since medicine has been increased.     HPI: The patient is a 87 y.o. female who presents today for HTN, Cholesterol, DM. She complains of leg swelling since increased dose.  Hypertension: Here for follow up of hypertension.  Currently on norvasc 10mg /day . Home readings range from 115-140 systolic/70-80 diastolic. Takes medication as prescribed and denies any side effects. Exercise includes walking. Weight has been stable. Denies any chest pain, headaches, shortness of breath, vision changes. She has noticed a mild increase in her leg swelling. She states it is not bad, but she just doesn't like how it looks.   Hyperlipidemia Currently on pravachol 20mg /day. She takes this as prescribed. She sometimes gets leg cramps, but this is very infrequent. She has not eaten today for fasting labs.   Diabetes: Patient is here for follow up of type 2 diabetes. Diet controlled. Last A1C was 6.4. Currently exercising and following diabetic diet. She walks daily, but this started back when weather became warmer.  Denies any hypoglycemic events. Denies any vision changes, nausea, vomiting, abdominal pain, ulcers/paraesthesia in feet, polyuria, polydipsia or polyphagia. Denies any chest pain, shortness of breath.   CKD Baseline creatinine around 1.2-1.37. avoids NSAIDs.   Wants me to look in her ears, feels like she has had more wax lately.    Review of Systems  Constitutional: Negative for chills, fatigue and fever.  HENT: Negative for dental problem, ear pain, hearing loss and trouble swallowing.   Eyes: Negative for visual disturbance.  Respiratory: Negative for cough, chest tightness and shortness of breath.   Cardiovascular: Positive for leg swelling. Negative for chest pain and palpitations.   Gastrointestinal: Negative for abdominal pain, blood in stool, diarrhea and nausea.  Endocrine: Negative for cold intolerance, polydipsia, polyphagia and polyuria.  Genitourinary: Negative for dysuria and hematuria.  Musculoskeletal: Negative for arthralgias.  Skin: Negative for rash.  Neurological: Negative for dizziness and headaches.  Psychiatric/Behavioral: Negative for dysphoric mood and sleep disturbance. The patient is not nervous/anxious.     Allergies Patient is allergic to lisinopril-hydrochlorothiazide and penicillins.  Past Medical History Patient  has a past medical history of Anemia (10/15/2016), CKD (chronic kidney disease) stage 3, GFR 30-59 ml/min (HCC) (09/01/2016), GERD (gastroesophageal reflux disease) (10/15/2016), High cholesterol, Hypertension, LBBB (left bundle branch block) (09/01/2016), Macular degeneration (10/15/2016), Syncope (09/01/2016), and Vitamin D deficiency (10/15/2016).  Surgical History Patient  has no past surgical history on file.  Family History Pateint's family history includes Early death in her brother; Heart disease in her brother, brother, mother, and paternal grandmother; Hypertension in her father.  Social History Patient  reports that she has never smoked. She has never used smokeless tobacco. She reports that she does not drink alcohol and does not use drugs.    Objective: Vitals:   07/15/20 0803  BP: (!) 142/70  Pulse: 88  Temp: 97.6 F (36.4 C)  TempSrc: Temporal  SpO2: 97%  Weight: 154 lb 12.8 oz (70.2 kg)  Height: 5\' 2"  (1.575 m)    Body mass index is 28.31 kg/m.  Physical Exam Vitals reviewed.  Constitutional:      Appearance: Normal appearance. She is well-developed and normal weight.  HENT:     Head: Normocephalic and atraumatic.     Right Ear: External ear normal.  There is impacted cerumen.     Left Ear: Tympanic membrane, ear canal and external ear normal.     Nose: Nose normal.     Mouth/Throat:     Mouth:  Mucous membranes are moist.  Eyes:     Conjunctiva/sclera: Conjunctivae normal.     Pupils: Pupils are equal, round, and reactive to light.  Neck:     Thyroid: No thyromegaly.     Vascular: No carotid bruit.  Cardiovascular:     Rate and Rhythm: Normal rate and regular rhythm.     Pulses: Normal pulses.     Heart sounds: Normal heart sounds. No murmur heard.   Pulmonary:     Effort: Pulmonary effort is normal.     Breath sounds: Normal breath sounds.  Abdominal:     General: Abdomen is flat. Bowel sounds are normal. There is no distension.     Palpations: Abdomen is soft.     Tenderness: There is no abdominal tenderness.  Musculoskeletal:     Cervical back: Normal range of motion and neck supple.     Comments: Minimal bilateral lower extremity edema in feet.   Lymphadenopathy:     Cervical: No cervical adenopathy.  Skin:    General: Skin is warm and dry.     Capillary Refill: Capillary refill takes less than 2 seconds.     Findings: No rash.  Neurological:     General: No focal deficit present.     Mental Status: She is alert and oriented to person, place, and time.     Cranial Nerves: No cranial nerve deficit.     Coordination: Coordination normal.     Deep Tendon Reflexes: Reflexes normal.  Psychiatric:        Mood and Affect: Mood normal.        Behavior: Behavior normal.    Flowsheet Row Office Visit from 02/25/2020 in [facility]  PHQ-2 Total Score 0     Ceruminosis is noted. Verbal consent obtained for ear irrigation.  Wax is removed by syringing and manual debridement. Instructions for home care to prevent wax buildup are given.     Assessment/plan: 1. Essential hypertension Blood pressure is to goal. Continue current anti-hypertensive medication of norvasc 10mg . Discussed her leg swelling is so minimal and I have limited drugs due to allergies. She would also have to take 2 pills for blood pressure vs. One which decrease compliance. She  has decided to continue with her norvasc. If swelling gets worse, she is to let me know, but very minimal today. Refills not given and routine lab work will be done today. Recommended routine exercise and healthy diet including DASH diet and mediterranean diet. Encouraged weight loss. F/u in 6 months.   - CBC with Differential/Platelet - Comprehensive metabolic panel - Microalbumin / creatinine urine ratio  2. Controlled type 2 diabetes mellitus without complication, without long-term current use of insulin (HCC) Has been to goal/well controlled for age on diet alone. Do not expect much change.  F/u in 6 months time.  - Hemoglobin A1c - Microalbumin / creatinine urine ratio  3. Stage 3 chronic kidney disease, unspecified whether stage 3a or 3b CKD (HCC)   4. Mixed hyperlipidemia  - Lipid panel  5. Cerumen impaction, right ear Irrigation and debridement. Recommended otc debrox.   Return in about 6 months (around 01/14/2021) for htn/diabet/ckd.   03/16/2021, MD [place] [facility] [facility]   07/15/2020

## 2020-07-18 ENCOUNTER — Other Ambulatory Visit: Payer: Self-pay | Admitting: Family Medicine

## 2020-07-18 DIAGNOSIS — K219 Gastro-esophageal reflux disease without esophagitis: Secondary | ICD-10-CM

## 2020-07-25 ENCOUNTER — Other Ambulatory Visit: Payer: Self-pay | Admitting: Family Medicine

## 2020-07-25 DIAGNOSIS — I10 Essential (primary) hypertension: Secondary | ICD-10-CM

## 2020-08-03 ENCOUNTER — Ambulatory Visit: Payer: Medicare Other | Admitting: Family Medicine

## 2020-08-23 ENCOUNTER — Other Ambulatory Visit: Payer: Self-pay

## 2020-08-23 ENCOUNTER — Encounter (INDEPENDENT_AMBULATORY_CARE_PROVIDER_SITE_OTHER): Payer: Medicare Other | Admitting: Ophthalmology

## 2020-08-23 DIAGNOSIS — H353231 Exudative age-related macular degeneration, bilateral, with active choroidal neovascularization: Secondary | ICD-10-CM | POA: Diagnosis not present

## 2020-08-23 DIAGNOSIS — H35033 Hypertensive retinopathy, bilateral: Secondary | ICD-10-CM | POA: Diagnosis not present

## 2020-08-23 DIAGNOSIS — I10 Essential (primary) hypertension: Secondary | ICD-10-CM | POA: Diagnosis not present

## 2020-08-23 DIAGNOSIS — H43813 Vitreous degeneration, bilateral: Secondary | ICD-10-CM | POA: Diagnosis not present

## 2020-08-24 ENCOUNTER — Ambulatory Visit: Payer: Medicare Other | Admitting: Family Medicine

## 2020-10-19 ENCOUNTER — Encounter: Payer: Medicare Other | Admitting: Family Medicine

## 2020-10-21 ENCOUNTER — Telehealth: Payer: Self-pay

## 2020-10-21 ENCOUNTER — Other Ambulatory Visit: Payer: Self-pay | Admitting: Family Medicine

## 2020-10-21 ENCOUNTER — Telehealth: Payer: Self-pay | Admitting: Family Medicine

## 2020-10-21 DIAGNOSIS — K219 Gastro-esophageal reflux disease without esophagitis: Secondary | ICD-10-CM

## 2020-10-21 MED ORDER — OMEPRAZOLE 40 MG PO CPDR: 40.0000 mg | DELAYED_RELEASE_CAPSULE | Freq: Every day | ORAL | 0 refills | Status: DC

## 2020-10-21 NOTE — Telephone Encounter (Signed)
   MEDICATION: omeprazole (PRILOSEC) 40 MG capsule  Is the patient out of medication? Yes   PHARMACY:  [id number] - [place], [place] - [address] AT [facility] [address] Phone:  [phone]  Fax:  [phone]      Comment: Patient is scheduled for a TOC with Dr. Veto Kemps needs medication to make it to appt.

## 2020-10-21 NOTE — Telephone Encounter (Signed)
Can we refill this for patient.  Last refilled by Lowella Fairy 07/2020 and has appointment 11/15/20 with you.   Please review and advise.  Thanks. Dm/cma

## 2020-10-21 NOTE — Telephone Encounter (Signed)
Spoke to pt told her Rx for Omeprazole was sent to pharmacy. Pt verbalized understanding.

## 2020-10-21 NOTE — Telephone Encounter (Signed)
I called pts niece about message we received from after hours nurse. Pts niece called back

## 2020-10-21 NOTE — Telephone Encounter (Signed)
Pt's granddaughter([name]) called in wanting Dr. Veto Kemps to refill her omeprazole (PRILOSEC) 40 MG capsule [[id number]].  She asked me to put the request in, I informed her we had no earlier appointments for a TOC. She will need to be seen before he can prescribe this. I asked her to call pt's previous provider.

## 2020-11-15 ENCOUNTER — Other Ambulatory Visit: Payer: Self-pay

## 2020-11-15 ENCOUNTER — Encounter: Payer: Self-pay | Admitting: Family Medicine

## 2020-11-15 ENCOUNTER — Ambulatory Visit (INDEPENDENT_AMBULATORY_CARE_PROVIDER_SITE_OTHER): Payer: Medicare Other | Admitting: Family Medicine

## 2020-11-15 VITALS — BP 152/68 | HR 90 | Temp 97.6°F | Ht 62.0 in | Wt 155.0 lb

## 2020-11-15 DIAGNOSIS — I10 Essential (primary) hypertension: Secondary | ICD-10-CM | POA: Diagnosis not present

## 2020-11-15 DIAGNOSIS — K219 Gastro-esophageal reflux disease without esophagitis: Secondary | ICD-10-CM | POA: Diagnosis not present

## 2020-11-15 DIAGNOSIS — E782 Mixed hyperlipidemia: Secondary | ICD-10-CM

## 2020-11-15 DIAGNOSIS — M1612 Unilateral primary osteoarthritis, left hip: Secondary | ICD-10-CM | POA: Diagnosis not present

## 2020-11-15 DIAGNOSIS — E559 Vitamin D deficiency, unspecified: Secondary | ICD-10-CM | POA: Diagnosis not present

## 2020-11-15 DIAGNOSIS — N183 Chronic kidney disease, stage 3 unspecified: Secondary | ICD-10-CM | POA: Diagnosis not present

## 2020-11-15 DIAGNOSIS — E119 Type 2 diabetes mellitus without complications: Secondary | ICD-10-CM | POA: Diagnosis not present

## 2020-11-15 DIAGNOSIS — M810 Age-related osteoporosis without current pathological fracture: Secondary | ICD-10-CM | POA: Diagnosis not present

## 2020-11-15 LAB — BASIC METABOLIC PANEL
BUN: 18 mg/dL (ref 6–23)
CO2: 28 mEq/L (ref 19–32)
Calcium: 9.4 mg/dL (ref 8.4–10.5)
Chloride: 101 mEq/L (ref 96–112)
Creatinine, Ser: 1.37 mg/dL — ABNORMAL HIGH (ref 0.40–1.20)
GFR: 34.51 mL/min — ABNORMAL LOW (ref >60.00)
Glucose, Bld: 91 mg/dL (ref 70–99)
Potassium: 4.3 mEq/L (ref 3.5–5.1)
Sodium: 138 mEq/L (ref 135–145)

## 2020-11-15 LAB — HEMOGLOBIN A1C: Hgb A1c MFr Bld: 6.9 % — ABNORMAL HIGH (ref 4.6–6.5)

## 2020-11-15 MED ORDER — LISINOPRIL-HYDROCHLOROTHIAZIDE 10-12.5 MG PO TABS: 1.0000 | ORAL_TABLET | Freq: Every day | ORAL | 3 refills | Status: DC

## 2020-11-15 MED ORDER — LOSARTAN POTASSIUM 25 MG PO TABS: 25.0000 mg | ORAL_TABLET | Freq: Every day | ORAL | 5 refills | Status: DC

## 2020-11-15 NOTE — Progress Notes (Signed)
[facility] [address] [place] [address] Dept: [phone] Dept Fax: [phone]  Transfer of Care Office Visit  Subjective:    Patient ID: Whitney Davidson, female    DOB: [birth year], 87 y.o..   MRN: [id number]  Chief Complaint  Patient presents with   Establish Care    NP. Pt c/o Swelling in both ankles since taking amlodipine.    History of Present Illness:  Patient is in today to establish care. Whitney Davidson was born in [place], but lived away for 61 years. Much of this time was in [place], [place]. She is a widow, her husband having passed in 2014. She has no children. She moved to [place] to be closer to family (her brother, [place], and her niece). She is retired from having worked for the [employer] (the [employer] prior to that). She denies any tobacco, alcohol, or drug use.  Whitney Davidson has a history of hypertension. She is currently managed on amlodipine. However, she has noted pedal edema since this was started. She apparently had angioedema in the past with the use of Zestoretic (likely due to the lisinopril component). She also has Stage 3 CKD.  Whitney Davidson has a history of hyperlipidemia. She is managed on pravastatin.  Whitney Davidson chart indicates she has diabetes.  She is uncertain about this. Review of her old records does show a HbA1c= 6.7 in April.  Whitney Davidson has a history of GERD. She is managed on omeprazole. She notes that whenever she has missed a day, she develops heartburn. She has never tried to be off this for longer periods.  Whitney Davidson has a history of macular degeneration. She is managed by Dr. Ashley Royalty (ophthalmology) and receives injections int he eye every few months.  Whitney Davidson has a prior history of osteoporosis and Vitamin D deficiency. She is currently on Vitamin D, but no calcium supplement.  Whitney Davidson has had issues with recurrent left hip pain. She was treated about a  year ago with a course of prednisone and baclofen. She does find it hard to get around in her home at times and she has difficulty with walking longer distances.  Past Medical History: Patient Active Problem List   Diagnosis Date Noted   Arthritis of left hip 11/15/2020   Controlled type 2 diabetes mellitus without complication, without long-term current use of insulin (HCC) 05/14/2019   Localized osteoporosis without current pathological fracture, takes calcium and D, refuses medication 07/19/2017   GERD (gastroesophageal reflux disease), on Omeprazole 10/15/2016   Macular degeneration 10/15/2016   Vitamin D deficiency 10/15/2016   Essential hypertension 09/01/2016   Hyperlipidemia, on Pravastatin 09/01/2016   LBBB (left bundle branch block) 09/01/2016   CKD (chronic kidney disease) stage 3, GFR 30-59 ml/min (HCC) 09/01/2016   History reviewed. No pertinent surgical history.  Family History  Problem Relation Age of Onset   Heart disease Mother    Stroke Father    Hypertension Father    Early death Brother    Diabetes Brother    Heart disease Brother    Heart disease Brother    Heart disease Paternal Grandmother    Cancer Maternal Aunt        Ovarian   Outpatient Medications Prior to Visit  Medication Sig Dispense Refill   Cholecalciferol 25 MCG (1000 UT) capsule Take 1,000 Units by mouth daily.     clotrimazole (LOTRIMIN) 1 % cream Apply 1 application topically 2 (two) times daily.  60 g 1   ketorolac (ACULAR) 0.5 % ophthalmic solution Place into the left eye.     Omega-3 Fatty Acids (FISH OIL) 1000 MG CAPS Take 1,000 mg by mouth 2 (two) times daily.     omeprazole (PRILOSEC) 40 MG capsule TAKE 1 CAPSULE(40 MG) BY MOUTH DAILY 90 capsule 2   pravastatin (PRAVACHOL) 20 MG tablet TAKE 1 TABLET BY MOUTH DAILY 90 tablet 3   trimethoprim-polymyxin b (POLYTRIM) ophthalmic solution INT 1 GTT INTO OS QID 2 DAYS AFTER EACH MONTHLY INJECTION  12   amLODipine (NORVASC) 10 MG tablet Take 1  tablet (10 mg total) by mouth daily. (Patient not taking: Reported on 11/15/2020) 90 tablet 3   No facility-administered medications prior to visit.   Allergies  Allergen Reactions   Lisinopril-Hydrochlorothiazide Swelling    Angioedema   Penicillins     Has patient had a PCN reaction causing immediate rash, facial/tongue/throat swelling, SOB or lightheadedness with hypotension: Yes Has patient had a PCN reaction causing severe rash involving mucus membranes or skin necrosis: Unknown Has patient had a PCN reaction that required hospitalization: Unknown Has patient had a PCN reaction occurring within the last 10 years: No If all of the above answers are "NO", then may proceed with Cephalosporin use.   Objective:   Today's Vitals   11/15/20 1055  BP: (!) 152/68  Pulse: 90  Temp: 97.6 F (36.4 C)  TempSrc: Temporal  SpO2: 97%  Weight: 155 lb (70.3 kg)  Height: 5\' 2"  (1.575 m)   Body mass index is 28.35 kg/m.   General: Well developed, well nourished. No acute distress. Extremities: 1+ edema of both lower legs. Psych: Alert and oriented. Normal mood and affect.  Health Maintenance Due  Topic Date Due   FOOT EXAM  Never done   OPHTHALMOLOGY EXAM  Never done   Zoster Vaccines- Shingrix (1 of 2) Never done   DEXA SCAN  04/30/2020   COVID-19 Vaccine (4 - Booster for Pfizer series) 05/08/2020   Lab Results Lab Results  Component Value Date   HGBA1C 6.7 (H) 07/15/2020   BMP Latest Ref Rng & Units 07/15/2020 02/25/2020 10/22/2019  Glucose 70 - 99 mg/dL 96 10/24/2019) 70  BUN 6 - 23 mg/dL 17 16 20   Creatinine 0.40 - 1.20 mg/dL 829(H) ) 3.71(I  BUN/Creat Ratio 6 - 22 (calc) - 13 -  Sodium 135 - 145 mEq/L 139 141 141  Potassium 3.5 - 5.1 mEq/L 4.5 4.5 4.3  Chloride 96 - 112 mEq/L 102 103 103  CO2 19 - 32 mEq/L 30 28 30   Calcium 8.4 - 10.5 mg/dL 9.3 9.6 9.4   Lab Results  Component Value Date   CHOL 170 07/15/2020   HDL 57.90 07/15/2020   LDLCALC 91 07/15/2020   TRIG 108.0  07/15/2020   CHOLHDL 3 07/15/2020   Bone Density (04/30/2017) ASSESSMENT: The BMD measured at Femur Neck Left is 0.640 g/cm2 with a T-score of -2.9. This patient is considered osteoporotic according to World Health Organization [facility]) criteria.   Site Region Measured Date Measured Age WHO YA BMD Classification T-score AP Spine L1-L4 04/30/2017 84.8 Osteopenia -2.4 0.890 g/cm2   DualFemur Neck Left 6/30/2017 84.8 [age] Osteoporosis -2.9 0.640g/cm2  Assessment & Plan:   1. Essential hypertension Blood pressure is above goal in light of her CKD. However, as she is having pedal edema, a known side effect of CCBs, I will switch her to losartan. I will recheck on her blood pressure control in 1 month.  -  Basic metabolic panel - losartan (COZAAR) 25 MG tablet; Take 1 tablet (25 mg total) by mouth daily.  Dispense: 30 tablet; Refill: 5  2. Gastroesophageal reflux disease without esophagitis We discussed that she might consider a trial off of Prilosec for 1 week. We discussed reflex hyperacidity after stopping PPIs, but that this often resolves. She will consider trying this.  3. Controlled type 2 diabetes mellitus without complication, without long-term current use of insulin (HCC) I see only a single HbA1c above normal and no confirming elevated fasting glucose > 120. I will rep[eat the HbA1c today to see if this confirms diabetes.  - Hemoglobin A1c  4. Stage 3 chronic kidney disease, unspecified whether stage 3a or 3b CKD (HCC) Due for reassessment.  - Basic metabolic panel  5. Mixed hyperlipidemia Last lipids were at goal on pravastatin. Reassess in 2 months.  6. Vitamin D deficiency We will check her Vitamin D level.  - VITAMIN D 25 Hydroxy (Vit-D Deficiency, Fractures)  7. Arthritis of left hip I feel it is reasonable to request a wheeled walker for Whitney Davidson to assist with ambulation. I also completed forms for her to obtain a disabled parking placard.  - Walker rolling  8.  Age-related osteoporosis without current pathological fracture Patient refused bisphosphanates in the past. I did recommend that she start takign a daily calcium supplement to hopefully curtail further bone loss.  Loyola Mast, MD

## 2020-11-16 ENCOUNTER — Encounter: Payer: Self-pay | Admitting: Family Medicine

## 2020-11-16 ENCOUNTER — Telehealth: Payer: Self-pay

## 2020-11-16 LAB — VITAMIN D 25 HYDROXY (VIT D DEFICIENCY, FRACTURES): VITD: 115.34 ng/mL (ref 30.00–100.00)

## 2020-11-16 NOTE — Telephone Encounter (Signed)
Received a critical Vitamin D level of 115.34. please review and advise.   Thanks. Dm/cma

## 2020-11-17 NOTE — Telephone Encounter (Signed)
Spoke to patient and notate message on lab notes. Dm/cma

## 2020-11-22 ENCOUNTER — Other Ambulatory Visit: Payer: Self-pay

## 2020-11-22 ENCOUNTER — Encounter (INDEPENDENT_AMBULATORY_CARE_PROVIDER_SITE_OTHER): Payer: Medicare Other | Admitting: Ophthalmology

## 2020-11-22 DIAGNOSIS — I10 Essential (primary) hypertension: Secondary | ICD-10-CM

## 2020-11-22 DIAGNOSIS — H353231 Exudative age-related macular degeneration, bilateral, with active choroidal neovascularization: Secondary | ICD-10-CM

## 2020-11-22 DIAGNOSIS — H35033 Hypertensive retinopathy, bilateral: Secondary | ICD-10-CM | POA: Diagnosis not present

## 2020-11-22 DIAGNOSIS — H43813 Vitreous degeneration, bilateral: Secondary | ICD-10-CM | POA: Diagnosis not present

## 2020-12-02 ENCOUNTER — Telehealth: Payer: Self-pay | Admitting: Family Medicine

## 2020-12-02 NOTE — Telephone Encounter (Signed)
They are gonna try for an urgent care so she can be evaluated in office.  Dm/cma

## 2020-12-02 NOTE — Telephone Encounter (Signed)
Pts niece, Driscilla Grammes called stating Ms Kobs has Covid, low fever 99.2, congested, cough and very tired. Wondering if there is anything Dr Veto Kemps can prescribe or recommend. Also, she is wanting to know if she can get back on her meds for indigestion.

## 2020-12-02 NOTE — Telephone Encounter (Signed)
Spoke to patient, she started having symptoms: fatigue, nasal congestion, scratchy throat on 11/29/20.  She has been taking Coricidin with some relief.  Tested positive for Covid on 12/01/20.  Transferred call to [facility] to try to get a virtual visit at another office due to we have no openings.  Dm/cma

## 2020-12-05 DIAGNOSIS — Z1152 Encounter for screening for COVID-19: Secondary | ICD-10-CM | POA: Diagnosis not present

## 2020-12-13 ENCOUNTER — Ambulatory Visit (INDEPENDENT_AMBULATORY_CARE_PROVIDER_SITE_OTHER): Payer: Medicare Other | Admitting: *Deleted

## 2020-12-13 DIAGNOSIS — Z0000 Encounter for general adult medical examination without abnormal findings: Secondary | ICD-10-CM | POA: Diagnosis not present

## 2020-12-13 NOTE — Progress Notes (Signed)
Subjective:   Whitney Davidson is a 87 y.o. female who presents for Medicare Annual (Subsequent) preventive examination.  I connected with  [name] on 12/13/20 by a telephone enabled telemedicine application and verified that I am speaking with the correct person using two identifiers.   I discussed the limitations of evaluation and management by telemedicine. The patient expressed understanding and agreed to proceed.   Review of Systems     Nutrition Risk Assessment:  Has the patient had any N/V/D within the last 2 months?  No  Does the patient have any non-healing wounds?  No  Has the patient had any unintentional weight loss or weight gain?  No   Diabetes:  Is the patient diabetic?  Yes  If diabetic, was a CBG obtained today?  No  Did the patient bring in their glucometer from home?  No  How often do you monitor your CBG's? Doesn't check.   Financial Strains and Diabetes Management:  Are you having any financial strains with the device, your supplies or your medication? No .  Does the patient want to be seen by Chronic Care Management for management of their diabetes?  No  Would the patient like to be referred to a Nutritionist or for Diabetic Management?  No   Diabetic Exams:  Diabetic Eye Exam: Completed Dr. Ashley Royalty.  Diabetic Foot Exam: Completed . Pt has been advised about the importance in completing this exam. Pt is scheduled for diabetic foot exam will do next visit on 12-19-2020  Cardiac Risk Factors include: advanced age (>53men, >34 women);hypertension;diabetes mellitus     Objective:    Today's Vitals   There is no height or weight on file to calculate BMI.  Advanced Directives 12/13/2020 08/07/2019 10/29/2017 09/02/2016  Does Patient Have a Medical Advance Directive? Yes Yes Yes No  Type of Sales promotion account executive of [employer] Power of Welcome;Living will -  Does patient want to make changes to medical  advance directive? - No - Patient declined No - Patient declined -  Copy of Healthcare Power of Attorney in Chart? No - copy requested No - copy requested No - copy requested -  Would patient like information on creating a medical advance directive? - - - No - Patient declined    Current Medications (verified) Outpatient Encounter Medications as of 12/13/2020  Medication Sig   Cholecalciferol 25 MCG (1000 UT) capsule Take 1,000 Units by mouth daily.   clotrimazole (LOTRIMIN) 1 % cream Apply 1 application topically 2 (two) times daily.   ketorolac (ACULAR) 0.5 % ophthalmic solution Place into the left eye.   losartan (COZAAR) 25 MG tablet Take 1 tablet (25 mg total) by mouth daily.   Omega-3 Fatty Acids (FISH OIL) 1000 MG CAPS Take 1,000 mg by mouth 2 (two) times daily.   pravastatin (PRAVACHOL) 20 MG tablet TAKE 1 TABLET BY MOUTH DAILY   trimethoprim-polymyxin b (POLYTRIM) ophthalmic solution INT 1 GTT INTO OS QID 2 DAYS AFTER EACH MONTHLY INJECTION   omeprazole (PRILOSEC) 40 MG capsule TAKE 1 CAPSULE(40 MG) BY MOUTH DAILY (Patient not taking: Reported on 12/13/2020)   No facility-administered encounter medications on file as of 12/13/2020.    Allergies (verified) Lisinopril-hydrochlorothiazide and Penicillins   History: Past Medical History:  Diagnosis Date   Anemia 10/15/2016   CKD (chronic kidney disease) stage 3, GFR 30-59 ml/min (HCC) 09/01/2016   GERD (gastroesophageal reflux disease) 10/15/2016   High cholesterol    Hypertension  LBBB (left bundle branch block) 09/01/2016   Macular degeneration 10/15/2016   Syncope 09/01/2016   Due to dehydration. See ER visit on 09/01/2016.   Vitamin D deficiency 10/15/2016   No past surgical history on file. Family History  Problem Relation Age of Onset   Heart disease Mother    Stroke Father    Hypertension Father    Early death Brother    Diabetes Brother    Heart disease Brother    Heart disease Brother    Heart disease Paternal  Grandmother    Cancer Maternal Aunt        Ovarian   Social History   Socioeconomic History   Marital status: Widowed    Spouse name: Not on file   Number of children: 0   Years of education: Not on file   Highest education level: Not on file  Occupational History   Occupation: Retired     Comment: [employer], Merchandiser, [employer]  Tobacco Use   Smoking status: Never   Smokeless tobacco: Never  Building services engineerVaping Use   Vaping Use: Never used  Substance and Sexual Activity   Alcohol use: No   Drug use: No   Sexual activity: Not Currently    Birth control/protection: Post-menopausal  Other Topics Concern   Not on file  Social History Narrative   Lives alone   Doesn't drive    Niece lives nearby and assists and transports    Social Determinants of Health   Financial Resource Strain: Low Risk    Difficulty of Paying Living Expenses: Not hard at all  Food Insecurity: No Food Insecurity   Worried About Programme researcher, broadcasting/film/videounning Out of Food in the Last Year: Never true   Baristaan Out of Food in the Last Year: Never true  Transportation Needs: No Transportation Needs   Lack of Transportation (Medical): No   Lack of Transportation (Non-Medical): No  Physical Activity: Insufficiently Active   Days of Exercise per Week: 2 days   Minutes of Exercise per Session: 20 min  Stress: No Stress Concern Present   Feeling of Stress : Not at all  Social Connections: Moderately Integrated   Frequency of Communication with Friends and Family: More than three times a week   Frequency of Social Gatherings with Friends and Family: Three times a week   Attends Religious Services: More than 4 times per year   Active Member of Clubs or Organizations: Yes   Attends BankerClub or Organization Meetings: 1 to 4 times per year   Marital Status: Widowed    Tobacco Counseling Counseling given: Not Answered   Clinical Intake:  Pre-visit preparation completed: Yes  Pain : No/denies pain     Nutritional  Risks: None Diabetes: Yes CBG done?: No Did pt. bring in CBG monitor from home?: No  How often do you need to have someone help you when you read instructions, pamphlets, or other written materials from your doctor or pharmacy?: 1 - Never  Diabetic? Yes    Interpreter Needed?: No  Information entered by :: Remi HaggardJulie Nallely Yost LPN   Activities of Daily Living In your present state of health, do you have any difficulty performing the following activities: 12/13/2020  Hearing? Y  Vision? N  Difficulty concentrating or making decisions? N  Walking or climbing stairs? Y  Dressing or bathing? N  Doing errands, shopping? Y  Preparing Food and eating ? N  Using the Toilet? N  In the past six months, have you accidently leaked urine? N  Do you have problems with loss of bowel control? N  Managing your Medications? N  Managing your Finances? N  Housekeeping or managing your Housekeeping? N  Some recent data might be hidden    Patient Care Team: Loyola Mast, MD as PCP - General (Family Medicine) Sherrie George, MD as Consulting Physician (Ophthalmology)  Indicate any recent Medical Services you may have received from other than [facility] providers in the past year (date may be approximate).     Assessment:   This is a routine wellness examination for Whitney Davidson.  Hearing/Vision screen Hearing Screening - Comments:: Hearing aids both ears feels they do not help Vision Screening - Comments:: Up to Date Dr. Ashley Royalty  Dietary issues and exercise activities discussed: Current Exercise Habits: Home exercise routine, Type of exercise: walking, Time (Minutes): 20, Frequency (Times/Week): 3, Weekly Exercise (Minutes/Week): 60, Intensity: Mild   Goals Addressed             This Visit's Progress    Patient Stated       Continue current lifestyle       Depression Screen PHQ 2/9 Scores 12/13/2020 02/25/2020 01/25/2020 08/07/2019 05/11/2019 10/29/2017 10/26/2017  PHQ - 2 Score 0 0 0 0 0 0 0   PHQ- 9 Score - - - - 3 1 -    Fall Risk Fall Risk  12/13/2020 07/15/2020 02/25/2020 01/25/2020 08/07/2019  Falls in the past year? 0 0 0 0 1  Comment - - - - -  Number falls in past yr: 0 - - - -  Injury with Fall? 0 - - - -  Follow up Falls evaluation completed;Education provided;Falls prevention discussed - - - -    FALL RISK PREVENTION PERTAINING TO THE HOME:  Any stairs in or around the home? No  If so, are there any without handrails? No  Home free of loose throw rugs in walkways, pet beds, electrical cords, etc? Yes  Adequate lighting in your home to reduce risk of falls? Yes   ASSISTIVE DEVICES UTILIZED TO PREVENT FALLS:  Life alert? No  Use of a cane, walker or w/c? Yes  Grab bars in the bathroom? No  Shower chair or bench in shower? No  Elevated toilet seat or a handicapped toilet? Yes   TIMED UP AND GO:  Was the test performed? No .    Cognitive Function:  Normal cognitive status assessed by direct observation by this Nurse Health Advisor. No abnormalities found.       6CIT Screen 08/07/2019 10/29/2017  What Year? 0 points 0 points  What month? 0 points 0 points  What time? 0 points 0 points  Count back from 20 0 points 0 points  Months in reverse 0 points 0 points  Repeat phrase 0 points -  Total Score 0 -    Immunizations Immunization History  Administered Date(s) Administered   Influenza-Unspecified 04/09/2018   PFIZER(Purple Top)SARS-COV-2 Vaccination 05/23/2019, 06/17/2019, 01/07/2020    TDAP status: Due, Education has been provided regarding the importance of this vaccine. Advised may receive this vaccine at local pharmacy or Health Dept. Aware to provide a copy of the vaccination record if obtained from local pharmacy or Health Dept. Verbalized acceptance and understanding.  Flu Vaccine status: Up to date  Pneumococcal vaccine status: Due, Education has been provided regarding the importance of this vaccine. Advised may receive this vaccine at  local pharmacy or Health Dept. Aware to provide a copy of the vaccination record if obtained from local pharmacy  or Health Dept. Verbalized acceptance and understanding.  Covid-19 vaccine status: Information provided on how to obtain vaccines.   Qualifies for Shingles Vaccine? Yes   Zostavax completed No   Shingrix Completed?: No.    Education has been provided regarding the importance of this vaccine. Patient has been advised to call insurance company to determine out of pocket expense if they have not yet received this vaccine. Advised may also receive vaccine at local pharmacy or Health Dept. Verbalized acceptance and understanding.  Screening Tests Health Maintenance  Topic Date Due   FOOT EXAM  Never done   Zoster Vaccines- Shingrix (1 of 2) Never done   DEXA SCAN  04/30/2020   COVID-19 Vaccine (4 - Booster for Pfizer series) 05/08/2020   HEMOGLOBIN A1C  05/18/2021   OPHTHALMOLOGY EXAM  11/22/2021   HPV VACCINES  Aged Out   INFLUENZA VACCINE  Discontinued   TETANUS/TDAP  Discontinued   PNA vac Low Risk Adult  Discontinued    Health Maintenance  Health Maintenance Due  Topic Date Due   FOOT EXAM  Never done   Zoster Vaccines- Shingrix (1 of 2) Never done   DEXA SCAN  04/30/2020   COVID-19 Vaccine (4 - Booster for Pfizer series) 05/08/2020    Colorectal cancer screening: No longer required.   Mammogram status: No longer required due to age.  Bone Density:  Declined  Lung Cancer Screening: (Low Dose CT Chest recommended if Age 37-80 years, 30 pack-year currently smoking OR have quit w/in 15years.) does not qualify.   Lung Cancer Screening Referral:   Additional Screening:  Hepatitis C Screening: does not qualify;   Vision Screening: Recommended annual ophthalmology exams for early detection of glaucoma and other disorders of the eye. Is the patient up to date with their annual eye exam?  Yes  Who is the provider or what is the name of the office in which the patient  attends annual eye exams? Dr. Jerolyn Center If pt is not established with a provider, would they like to be referred to a provider to establish care? No .   Dental Screening: Recommended annual dental exams for proper oral hygiene  Community Resource Referral / Chronic Care Management: CRR required this visit?  No   CCM required this visit?  No      Plan:     I have personally reviewed and noted the following in the patient's chart:   Medical and social history Use of alcohol, tobacco or illicit drugs  Current medications and supplements including opioid prescriptions.  Functional ability and status Nutritional status Physical activity Advanced directives List of other physicians Hospitalizations, surgeries, and ER visits in previous 12 months Vitals Screenings to include cognitive, depression, and falls Referrals and appointments  In addition, I have reviewed and discussed with patient certain preventive protocols, quality metrics, and best practice recommendations. A written personalized care plan for preventive services as well as general preventive health recommendations were provided to patient.     Remi Haggard, LPN   07/10/8766   Nurse Notes:

## 2020-12-13 NOTE — Patient Instructions (Signed)
Whitney Davidson , Thank you for taking time to come for your Medicare Wellness Visit. I appreciate your ongoing commitment to your health goals. Please review the following plan we discussed and let me know if I can assist you in the future.   Screening recommendations/referrals: Colonoscopy: no longer required Mammogram: no longer required Bone Density: Education provided Recommended yearly ophthalmology/optometry visit for glaucoma screening and checkup Recommended yearly dental visit for hygiene and checkup  Vaccinations: Influenza vaccine: Education provided Pneumococcal vaccine: Education provided Tdap vaccine: Education provided Shingles vaccine: Education provided    Advanced directives: copy requested  Conditions/risks identified:  Next appointment: 12-19-2020 @ 8:00 am Dr. Veto Kemps   Preventive Care 65 Years and Older, Female Preventive care refers to lifestyle choices and visits with your health care provider that can promote health and wellness. What does preventive care include? A yearly physical exam. This is also called an annual well check. Dental exams once or twice a year. Routine eye exams. Ask your health care provider how often you should have your eyes checked. Personal lifestyle choices, including: Daily care of your teeth and gums. Regular physical activity. Eating a healthy diet. Avoiding tobacco and drug use. Limiting alcohol use. Practicing safe sex. Taking low-dose aspirin every day. Taking vitamin and mineral supplements as recommended by your health care provider. What happens during an annual well check? The services and screenings done by your health care provider during your annual well check will depend on your age, overall health, lifestyle risk factors, and family history of disease. Counseling  Your health care provider may ask you questions about your: Alcohol use. Tobacco use. Drug use. Emotional well-being. Home and relationship  well-being. Sexual activity. Eating habits. History of falls. Memory and ability to understand (cognition). Work and work Astronomer. Reproductive health. Screening  You may have the following tests or measurements: Height, weight, and BMI. Blood pressure. Lipid and cholesterol levels. These may be checked every 5 years, or more frequently if you are over 8 years old. Skin check. Lung cancer screening. You may have this screening every year starting at age 75 if you have a 30-pack-year history of smoking and currently smoke or have quit within the past 15 years. Fecal occult blood test (FOBT) of the stool. You may have this test every year starting at age 80. Flexible sigmoidoscopy or colonoscopy. You may have a sigmoidoscopy every 5 years or a colonoscopy every 10 years starting at age 68. Hepatitis C blood test. Hepatitis B blood test. Sexually transmitted disease (STD) testing. Diabetes screening. This is done by checking your blood sugar (glucose) after you have not eaten for a while (fasting). You may have this done every 1-3 years. Bone density scan. This is done to screen for osteoporosis. You may have this done starting at age 103. Mammogram. This may be done every 1-2 years. Talk to your health care provider about how often you should have regular mammograms. Talk with your health care provider about your test results, treatment options, and if necessary, the need for more tests. Vaccines  Your health care provider may recommend certain vaccines, such as: Influenza vaccine. This is recommended every year. Tetanus, diphtheria, and acellular pertussis (Tdap, Td) vaccine. You may need a Td booster every 10 years. Zoster vaccine. You may need this after age 25. Pneumococcal 13-valent conjugate (PCV13) vaccine. One dose is recommended after age 65. Pneumococcal polysaccharide (PPSV23) vaccine. One dose is recommended after age 71. Talk to your health care provider about which  screenings and vaccines you need and how often you need them. This information is not intended to replace advice given to you by your health care provider. Make sure you discuss any questions you have with your health care provider. Document Released: 04/22/2015 Document Revised: 12/14/2015 Document Reviewed: 01/25/2015 Elsevier Interactive Patient Education  2017 [place] Prevention in the Home Falls can cause injuries. They can happen to people of all ages. There are many things you can do to make your home safe and to help prevent falls. What can I do on the outside of my home? Regularly fix the edges of walkways and driveways and fix any cracks. Remove anything that might make you trip as you walk through a door, such as a raised step or threshold. Trim any bushes or trees on the path to your home. Use bright outdoor lighting. Clear any walking paths of anything that might make someone trip, such as rocks or tools. Regularly check to see if handrails are loose or broken. Make sure that both sides of any steps have handrails. Any raised decks and porches should have guardrails on the edges. Have any leaves, snow, or ice cleared regularly. Use sand or salt on walking paths during winter. Clean up any spills in your garage right away. This includes oil or grease spills. What can I do in the bathroom? Use night lights. Install grab bars by the toilet and in the tub and shower. Do not use towel bars as grab bars. Use non-skid mats or decals in the tub or shower. If you need to sit down in the shower, use a plastic, non-slip stool. Keep the floor dry. Clean up any water that spills on the floor as soon as it happens. Remove soap buildup in the tub or shower regularly. Attach bath mats securely with double-sided non-slip rug tape. Do not have throw rugs and other things on the floor that can make you trip. What can I do in the bedroom? Use night lights. Make sure that you have a  light by your bed that is easy to reach. Do not use any sheets or blankets that are too big for your bed. They should not hang down onto the floor. Have a firm chair that has side arms. You can use this for support while you get dressed. Do not have throw rugs and other things on the floor that can make you trip. What can I do in the kitchen? Clean up any spills right away. Avoid walking on wet floors. Keep items that you use a lot in easy-to-reach places. If you need to reach something above you, use a strong step stool that has a grab bar. Keep electrical cords out of the way. Do not use floor polish or wax that makes floors slippery. If you must use wax, use non-skid floor wax. Do not have throw rugs and other things on the floor that can make you trip. What can I do with my stairs? Do not leave any items on the stairs. Make sure that there are handrails on both sides of the stairs and use them. Fix handrails that are broken or loose. Make sure that handrails are as long as the stairways. Check any carpeting to make sure that it is firmly attached to the stairs. Fix any carpet that is loose or worn. Avoid having throw rugs at the top or bottom of the stairs. If you do have throw rugs, attach them to the floor with carpet tape.  Make sure that you have a light switch at the top of the stairs and the bottom of the stairs. If you do not have them, ask someone to add them for you. What else can I do to help prevent falls? Wear shoes that: Do not have high heels. Have rubber bottoms. Are comfortable and fit you well. Are closed at the toe. Do not wear sandals. If you use a stepladder: Make sure that it is fully opened. Do not climb a closed stepladder. Make sure that both sides of the stepladder are locked into place. Ask someone to hold it for you, if possible. Clearly mark and make sure that you can see: Any grab bars or handrails. First and last steps. Where the edge of each step  is. Use tools that help you move around (mobility aids) if they are needed. These include: Canes. Walkers. Scooters. Crutches. Turn on the lights when you go into a dark area. Replace any light bulbs as soon as they burn out. Set up your furniture so you have a clear path. Avoid moving your furniture around. If any of your floors are uneven, fix them. If there are any pets around you, be aware of where they are. Review your medicines with your doctor. Some medicines can make you feel dizzy. This can increase your chance of falling. Ask your doctor what other things that you can do to help prevent falls. This information is not intended to replace advice given to you by your health care provider. Make sure you discuss any questions you have with your health care provider. Document Released: 01/20/2009 Document Revised: 09/01/2015 Document Reviewed: 04/30/2014 Elsevier Interactive Patient Education  2017 [employer].

## 2020-12-19 ENCOUNTER — Encounter: Payer: Self-pay | Admitting: Family Medicine

## 2020-12-19 ENCOUNTER — Other Ambulatory Visit: Payer: Self-pay

## 2020-12-19 ENCOUNTER — Ambulatory Visit (INDEPENDENT_AMBULATORY_CARE_PROVIDER_SITE_OTHER): Payer: Medicare Other | Admitting: Family Medicine

## 2020-12-19 VITALS — BP 150/60 | HR 100 | Temp 98.0°F | Ht 62.0 in | Wt 142.8 lb

## 2020-12-19 DIAGNOSIS — Z23 Encounter for immunization: Secondary | ICD-10-CM | POA: Diagnosis not present

## 2020-12-19 DIAGNOSIS — K219 Gastro-esophageal reflux disease without esophagitis: Secondary | ICD-10-CM | POA: Diagnosis not present

## 2020-12-19 DIAGNOSIS — M810 Age-related osteoporosis without current pathological fracture: Secondary | ICD-10-CM

## 2020-12-19 DIAGNOSIS — I10 Essential (primary) hypertension: Secondary | ICD-10-CM

## 2020-12-19 DIAGNOSIS — E119 Type 2 diabetes mellitus without complications: Secondary | ICD-10-CM | POA: Diagnosis not present

## 2020-12-19 NOTE — Progress Notes (Signed)
[facility] [address] [place] [address] Dept: [phone] Dept Fax: [phone]  Office Visit  Subjective:    Patient ID: [name], female    DOB: [birth year], 87 y.o..   MRN: [id number]  Chief Complaint  Patient presents with   Follow-up    4 week f/u on HTN. Pt states she checks BP at home and it ranges between 100-112/70-80. Pt states swelling in her feet has gone down drastically since the start of her medication and she is pleased with this.     History of Present Illness:  Patient is in today for reassessment of her hypertension. At her last visit, Ms. Varnadore had noted bilateral ankle edema. We had discussed that this might be related to her CCB. I switched her to losartan. She notes the edema has now resolved. She is monitoring her blood pressures at home. These are now running 100-112/70-80. She continues to get out and walk some every day.  Ms. Moulin attempted to stop use of her Prilosec, but noted she had significant heartburn off of the medication. She has resumed this and feels it is working well.  Past Medical History: Patient Active Problem List   Diagnosis Date Noted   Arthritis of left hip 11/15/2020   Controlled type 2 diabetes mellitus without complication, without long-term current use of insulin (HCC) 05/14/2019   Osteoporosis 07/19/2017   GERD (gastroesophageal reflux disease), on Omeprazole 10/15/2016   Macular degeneration 10/15/2016   Essential hypertension 09/01/2016   Hyperlipidemia 09/01/2016   LBBB (left bundle branch block) 09/01/2016   Chronic kidney disease, stage 3b (HCC) 09/01/2016   History reviewed. No pertinent surgical history.  Family History  Problem Relation Age of Onset   Heart disease Mother    Stroke Father    Hypertension Father    Early death Brother    Diabetes Brother    Heart disease Brother    Heart disease Brother    Heart disease Paternal Grandmother     Cancer Maternal Aunt        Ovarian   Outpatient Medications Prior to Visit  Medication Sig Dispense Refill   Cholecalciferol 25 MCG (1000 UT) capsule Take 1,000 Units by mouth daily.     clotrimazole (LOTRIMIN) 1 % cream Apply 1 application topically 2 (two) times daily. 60 g 1   ketorolac (ACULAR) 0.5 % ophthalmic solution Place into the left eye.     losartan (COZAAR) 25 MG tablet Take 1 tablet (25 mg total) by mouth daily. 30 tablet 5   Omega-3 Fatty Acids (FISH OIL) 1000 MG CAPS Take 1,000 mg by mouth 2 (two) times daily.     omeprazole (PRILOSEC) 40 MG capsule TAKE 1 CAPSULE(40 MG) BY MOUTH DAILY 90 capsule 2   pravastatin (PRAVACHOL) 20 MG tablet TAKE 1 TABLET BY MOUTH DAILY 90 tablet 3   trimethoprim-polymyxin b (POLYTRIM) ophthalmic solution INT 1 GTT INTO OS QID 2 DAYS AFTER EACH MONTHLY INJECTION  12   No facility-administered medications prior to visit.   Allergies  Allergen Reactions   Lisinopril-Hydrochlorothiazide Swelling    Angioedema   Penicillins     Has patient had a PCN reaction causing immediate rash, facial/tongue/throat swelling, SOB or lightheadedness with hypotension: Yes Has patient had a PCN reaction causing severe rash involving mucus membranes or skin necrosis: Unknown Has patient had a PCN reaction that required hospitalization: Unknown Has patient had a PCN reaction occurring within the last 10 years: No If all  of the above answers are "NO", then may proceed with Cephalosporin use.    Objective:   Today's Vitals   12/19/20 0805  BP: (!) 150/60  Pulse: 100  Temp: 98 F (36.7 C)  TempSrc: Temporal  SpO2: 96%  Height: 5\' 2"  (1.575 m)   Body mass index is 28.35 kg/m.   General: Well developed, well nourished. No acute distress. Feet: Skin intact. Pulses 2+. 5.07 mm monofilament normal. Psych: Alert and oriented. Normal mood and affect.  Health Maintenance Due  Topic Date Due   Zoster Vaccines- Shingrix (1 of 2) Never done   COVID-19 Vaccine  (4 - Booster for series) 05/08/2020   Lab Results Lab Results  Component Value Date   HGBA1C 6.9 (H) 11/15/2020   Lab Results  Component Value Date   CHOL 170 07/15/2020   HDL 57.90 07/15/2020   LDLCALC 91 07/15/2020   TRIG 108.0 07/15/2020   CHOLHDL 3 07/15/2020   BMP Latest Ref Rng & Units 11/15/2020 07/15/2020 02/25/2020  Glucose 70 - 99 mg/dL 91 96 02/27/2020)  BUN 6 - 23 mg/dL 18 17 16   Creatinine 0.40 - 1.20 mg/dL 673(A) ) 1.93(X)  BUN/Creat Ratio 6 - 22 (calc) - - 13  Sodium 135 - 145 mEq/L 138 139 141  Potassium 3.5 - 5.1 mEq/L 4.3 4.5 4.5  Chloride 96 - 112 mEq/L 101 102 103  CO2 19 - 32 mEq/L 28 30 28   Calcium 8.4 - 10.5 mg/dL 9.4 9.3 9.6   Component Ref Range & Units 1 mo ago 1 yr ago  VITD 30.00 - 100.00 ng/mL 115.34 High Panic   64.08       Assessment & Plan:   1. Essential hypertension Blood pressures at home are in good control and she is no longer having the edema issues. We will continue losartan and she will continue to monitor her BP at home.  2. Gastroesophageal reflux disease without esophagitis Stable on Prilosec.  3. Controlled type 2 diabetes mellitus without complication, without long-term current use of insulin (HCC) Foot exam today. We will recheck her HbA1c in 2 months.  4. Age-related osteoporosis without current pathological fracture Discussed potential therapies for osteoporosis. Unclear if she would see benefit from a bisphosphonate at age 46.I had her stop her Vit. D due to elevated level. I will recheck this in 2 months. Since not considering other therapies, do no see need to repeat her DXA scan.  0.97(D, MD

## 2021-01-15 ENCOUNTER — Other Ambulatory Visit: Payer: Self-pay | Admitting: Family Medicine

## 2021-01-15 DIAGNOSIS — I10 Essential (primary) hypertension: Secondary | ICD-10-CM

## 2021-02-13 ENCOUNTER — Other Ambulatory Visit: Payer: Self-pay | Admitting: Family Medicine

## 2021-02-13 DIAGNOSIS — I10 Essential (primary) hypertension: Secondary | ICD-10-CM

## 2021-02-14 ENCOUNTER — Encounter (INDEPENDENT_AMBULATORY_CARE_PROVIDER_SITE_OTHER): Payer: Medicare Other | Admitting: Ophthalmology

## 2021-02-14 ENCOUNTER — Other Ambulatory Visit: Payer: Self-pay

## 2021-02-14 DIAGNOSIS — H35033 Hypertensive retinopathy, bilateral: Secondary | ICD-10-CM | POA: Diagnosis not present

## 2021-02-14 DIAGNOSIS — I10 Essential (primary) hypertension: Secondary | ICD-10-CM

## 2021-02-14 DIAGNOSIS — H353231 Exudative age-related macular degeneration, bilateral, with active choroidal neovascularization: Secondary | ICD-10-CM | POA: Diagnosis not present

## 2021-02-14 DIAGNOSIS — H43813 Vitreous degeneration, bilateral: Secondary | ICD-10-CM

## 2021-02-16 ENCOUNTER — Telehealth: Payer: Self-pay | Admitting: Family Medicine

## 2021-02-16 NOTE — Telephone Encounter (Signed)
Pt called in about getting medication refills for omeprazole (PRILOSEC) 40 MG capsule and losartan (COZAAR) 25 MG tablet but based on last fill it shows there should still be refills, advised to call the pharmacy. I did let her know that the pravastatin (PRAVACHOL) 20 MG tablet was sent in on 02/13/21

## 2021-02-28 ENCOUNTER — Ambulatory Visit (INDEPENDENT_AMBULATORY_CARE_PROVIDER_SITE_OTHER): Payer: Medicare Other | Admitting: Family Medicine

## 2021-02-28 ENCOUNTER — Other Ambulatory Visit: Payer: Self-pay

## 2021-02-28 VITALS — BP 132/70 | HR 86 | Temp 97.0°F | Ht 62.0 in | Wt 140.8 lb

## 2021-02-28 DIAGNOSIS — E119 Type 2 diabetes mellitus without complications: Secondary | ICD-10-CM

## 2021-02-28 DIAGNOSIS — M810 Age-related osteoporosis without current pathological fracture: Secondary | ICD-10-CM | POA: Diagnosis not present

## 2021-02-28 DIAGNOSIS — I10 Essential (primary) hypertension: Secondary | ICD-10-CM | POA: Diagnosis not present

## 2021-02-28 DIAGNOSIS — E782 Mixed hyperlipidemia: Secondary | ICD-10-CM

## 2021-02-28 LAB — LIPID PANEL
Cholesterol: 150 mg/dL (ref 0–200)
HDL: 54.5 mg/dL (ref >39.00)
LDL Cholesterol: 80 mg/dL (ref 0–99)
NonHDL: 95.7
Total CHOL/HDL Ratio: 3
Triglycerides: 80 mg/dL (ref 0.0–149.0)
VLDL: 16 mg/dL (ref 0.0–40.0)

## 2021-02-28 LAB — HEMOGLOBIN A1C: Hgb A1c MFr Bld: 6.4 % (ref 4.6–6.5)

## 2021-02-28 LAB — GLUCOSE, RANDOM: Glucose, Bld: 99 mg/dL (ref 70–99)

## 2021-02-28 LAB — VITAMIN D 25 HYDROXY (VIT D DEFICIENCY, FRACTURES): VITD: 73.62 ng/mL (ref 30.00–100.00)

## 2021-02-28 NOTE — Progress Notes (Signed)
[facility] [address] [place] [address] Dept: [phone] Dept Fax: [phone]  Chronic Care Office Visit  Subjective:    Patient ID: Mellany Dinsmore, female    DOB: 1931/02/16, 87 y.o..   MRN: [id number]  Chief Complaint  Patient presents with   Follow-up    2 month f/u.  No concerns.      History of Present Illness:  Patient is in today for reassessment of chronic medical issues.  Ms. Clapp has a history of hypertension. She is currently managed on amlodipine. She has not had any recent swelling associated with this. She also has Stage 3b CKD. This is stable.   Ms. Failla has a history of hyperlipidemia. She is managed on pravastatin.   Ms. Soltys has a history of Type 2 diabetes.  This is currently diet-controlled. She notes she has been trying to reduce sugar intake and has had some weight loss. She notes her cupboard and refrigerator is full of food, but she is uncertain what is safe for her to consume.   Ms. Cara has a prior history of osteoporosis and Vitamin D deficiency. Her last Vitamin D level was high. I advised her to stop using her OTC supplement.   Ms. Delmonico lives in her own home. Her niece lives next door. She has stopped driving on her own. She has a friend and a nephew that helps her get to appointments.  Past Medical History: Patient Active Problem List   Diagnosis Date Noted   Arthritis of left hip 11/15/2020   Controlled type 2 diabetes mellitus without complication, without long-term current use of insulin (HCC) 05/14/2019   Osteoporosis 07/19/2017   GERD (gastroesophageal reflux disease), on Omeprazole 10/15/2016   Macular degeneration 10/15/2016   Essential hypertension 09/01/2016   Hyperlipidemia 09/01/2016   LBBB (left bundle branch block) 09/01/2016   Chronic kidney disease, stage 3b (HCC) 09/01/2016   No past surgical history on file.  Family History  Problem Relation Age of Onset    Heart disease Mother    Stroke Father    Hypertension Father    Early death Brother    Diabetes Brother    Heart disease Brother    Heart disease Brother    Heart disease Paternal Grandmother    Cancer Maternal Aunt        Ovarian   Outpatient Medications Prior to Visit  Medication Sig Dispense Refill   ketorolac (ACULAR) 0.5 % ophthalmic solution Place into the left eye.     losartan (COZAAR) 25 MG tablet Take 1 tablet (25 mg total) by mouth daily. 30 tablet 5   Multiple Vitamins-Minerals (PRESERVISION AREDS 2 PO) Take by mouth.     Omega-3 Fatty Acids (FISH OIL) 1000 MG CAPS Take 1,000 mg by mouth 2 (two) times daily.     omeprazole (PRILOSEC) 40 MG capsule TAKE 1 CAPSULE(40 MG) BY MOUTH DAILY 90 capsule 2   pravastatin (PRAVACHOL) 20 MG tablet TAKE 1 TABLET BY MOUTH DAILY 90 tablet 3   trimethoprim-polymyxin b (POLYTRIM) ophthalmic solution INT 1 GTT INTO OS QID 2 DAYS AFTER EACH MONTHLY INJECTION  12   Cholecalciferol 25 MCG (1000 UT) capsule Take 1,000 Units by mouth daily. (Patient not taking: Reported on 02/28/2021)     clotrimazole (LOTRIMIN) 1 % cream Apply 1 application topically 2 (two) times daily. (Patient not taking: Reported on 02/28/2021) 60 g 1   No facility-administered medications prior to visit.   Allergies  Allergen Reactions  Amlodipine Other (See Comments)    Pedal edema   Lisinopril-Hydrochlorothiazide Swelling    Angioedema   Penicillins     Has patient had a PCN reaction causing immediate rash, facial/tongue/throat swelling, SOB or lightheadedness with hypotension: Yes Has patient had a PCN reaction causing severe rash involving mucus membranes or skin necrosis: Unknown Has patient had a PCN reaction that required hospitalization: Unknown Has patient had a PCN reaction occurring within the last 10 years: No If all of the above answers are "NO", then may proceed with Cephalosporin use.   Objective:   Today's Vitals   02/28/21 0809  BP: 132/70   Pulse: 86  Temp: (!) 97 F (36.1 C)  TempSrc: Temporal  SpO2: 97%  Weight: 140 lb 12.8 oz (63.9 kg)  Height: 5\' 2"  (1.575 m)   Body mass index is 25.75 kg/m.   General: Well developed, well nourished. No acute distress. Extremities: No edema noted. Psych: Alert and oriented. Normal mood and affect.  Health Maintenance Due  Topic Date Due   Pneumonia Vaccine 82+ Years old (1 - PCV) Never done   Zoster Vaccines- Shingrix (1 of 2) Never done   COVID-19 Vaccine (4 - Booster for Pfizer series) 03/03/2020   Lab Results Lab Results  Component Value Date   HGBA1C 6.9 (H) 11/15/2020   BMP Latest Ref Rng & Units 11/15/2020 07/15/2020 02/25/2020  Glucose 70 - 99 mg/dL 91 96 108(H)  BUN 6 - 23 mg/dL 18 17 16   Creatinine 0.40 - 1.20 mg/dL 1.37(H) 1.26(H) 1.26(H)  BUN/Creat Ratio 6 - 22 (calc) - - 13  Sodium 135 - 145 mEq/L 138 139 141  Potassium 3.5 - 5.1 mEq/L 4.3 4.5 4.5  Chloride 96 - 112 mEq/L 101 102 103  CO2 19 - 32 mEq/L 28 30 28   Calcium 8.4 - 10.5 mg/dL 9.4 9.3 9.6   Component Ref Range & Units 3 mo ago 1 yr ago  VITD 30.00 - 100.00 ng/mL 115.34 High Panic   64.08      Assessment & Plan:   1. Essential hypertension Blood pressure is at goal. This is much improved from last spring. We will continue her amlodipine.  2. Controlled type 2 diabetes mellitus without complication, without long-term current use of insulin ([place]) Ms. Caslin diabetes has been in good control without the need for medication. I will recheck her A1c today. I offered to refer her to a nutritionist to discuss her diet and and help her with deciding on healthy food choices.  - Glucose, random - Hemoglobin A1c - Ambulatory referral to diabetic education  3. Mixed hyperlipidemia Last lipids were at goal on pravastatin. Collie Siad for reassessment.  - Lipid panel  4. Age-related osteoporosis without current pathological fracture Last Vitamin D level was high, so she is no longer on her supplement. We are  due to recheck her Vitamin D today. She has been advised to take a calcium supplement, but has never started this.  - VITAMIN D 25 Hydroxy (Vit-D Deficiency, Fractures)   Haydee Salter, MD

## 2021-05-02 ENCOUNTER — Encounter (INDEPENDENT_AMBULATORY_CARE_PROVIDER_SITE_OTHER): Payer: Medicare HMO | Admitting: Ophthalmology

## 2021-05-02 ENCOUNTER — Other Ambulatory Visit: Payer: Self-pay

## 2021-05-02 DIAGNOSIS — H353231 Exudative age-related macular degeneration, bilateral, with active choroidal neovascularization: Secondary | ICD-10-CM | POA: Diagnosis not present

## 2021-05-02 DIAGNOSIS — H43813 Vitreous degeneration, bilateral: Secondary | ICD-10-CM | POA: Diagnosis not present

## 2021-05-02 DIAGNOSIS — H35033 Hypertensive retinopathy, bilateral: Secondary | ICD-10-CM

## 2021-05-02 DIAGNOSIS — I10 Essential (primary) hypertension: Secondary | ICD-10-CM | POA: Diagnosis not present

## 2021-05-18 ENCOUNTER — Other Ambulatory Visit: Payer: Self-pay

## 2021-05-18 DIAGNOSIS — K219 Gastro-esophageal reflux disease without esophagitis: Secondary | ICD-10-CM

## 2021-05-18 DIAGNOSIS — I10 Essential (primary) hypertension: Secondary | ICD-10-CM

## 2021-05-18 MED ORDER — PRAVASTATIN SODIUM 20 MG PO TABS: 20.0000 mg | ORAL_TABLET | Freq: Every day | ORAL | 0 refills | Status: DC

## 2021-05-18 MED ORDER — OMEPRAZOLE 40 MG PO CPDR: DELAYED_RELEASE_CAPSULE | ORAL | 0 refills | Status: DC

## 2021-05-18 MED ORDER — LOSARTAN POTASSIUM 25 MG PO TABS: 25.0000 mg | ORAL_TABLET | Freq: Every day | ORAL | 0 refills | Status: DC

## 2021-05-18 NOTE — Telephone Encounter (Signed)
Received a refill request for Omeprazole, Pravastatin, and Losartan from [facility].  Called patient and she does want to get her meds from them.  Advised that we will send it in today for her and reminded her of her appointment on 05/31/21. Dm/cma

## 2021-05-31 ENCOUNTER — Ambulatory Visit (INDEPENDENT_AMBULATORY_CARE_PROVIDER_SITE_OTHER): Payer: Medicare HMO | Admitting: Family Medicine

## 2021-05-31 ENCOUNTER — Other Ambulatory Visit: Payer: Self-pay

## 2021-05-31 VITALS — BP 144/80 | HR 110 | Temp 97.8°F | Ht 62.0 in | Wt 141.8 lb

## 2021-05-31 DIAGNOSIS — R112 Nausea with vomiting, unspecified: Secondary | ICD-10-CM | POA: Diagnosis not present

## 2021-05-31 DIAGNOSIS — R6889 Other general symptoms and signs: Secondary | ICD-10-CM | POA: Diagnosis not present

## 2021-05-31 DIAGNOSIS — I129 Hypertensive chronic kidney disease with stage 1 through stage 4 chronic kidney disease, or unspecified chronic kidney disease: Secondary | ICD-10-CM

## 2021-05-31 DIAGNOSIS — E782 Mixed hyperlipidemia: Secondary | ICD-10-CM | POA: Diagnosis not present

## 2021-05-31 DIAGNOSIS — E1122 Type 2 diabetes mellitus with diabetic chronic kidney disease: Secondary | ICD-10-CM | POA: Diagnosis not present

## 2021-05-31 DIAGNOSIS — I10 Essential (primary) hypertension: Secondary | ICD-10-CM

## 2021-05-31 DIAGNOSIS — N1832 Chronic kidney disease, stage 3b: Secondary | ICD-10-CM

## 2021-05-31 LAB — GLUCOSE, RANDOM: Glucose, Bld: 129 mg/dL — ABNORMAL HIGH (ref 70–99)

## 2021-05-31 LAB — HEMOGLOBIN A1C: Hgb A1c MFr Bld: 6.4 % (ref 4.6–6.5)

## 2021-05-31 MED ORDER — ONDANSETRON 4 MG PO TBDP: 4.0000 mg | ORAL_TABLET | Freq: Three times a day (TID) | ORAL | 0 refills | Status: DC | PRN

## 2021-05-31 NOTE — Progress Notes (Signed)
[facility] [address] [place] [address] Dept: [phone] Dept Fax: [phone]  Chronic Care Office Visit  Subjective:    Patient ID: Whitney Davidson, female    DOB: Aug 17, 1930, 88 y.o..   MRN: [id number]  Chief Complaint  Patient presents with   Follow-up    3 month f/u  HTN/chol. , no concerns. Not fasting today.      History of Present Illness:  Patient is in today for reassessment of chronic medical issues.  Ms. Blamer has a history of hypertension. She is currently managed on amlodipine. She also has Stage 3b CKD. This has been stable.   Ms. Bangs has a history of hyperlipidemia. She is managed on pravastatin.   Ms. Gim has a history of Type 2 diabetes.  This is currently diet-controlled. She notes she is seeing her eye doctor every 6 weeks currently as there is an issue with "fluid" on her left eye.   Ms. Girouard has a prior history of osteoporosis and Vitamin D deficiency. She had an elevated Vit. D level, so we stopped her supplement. Her last check showed her Vit. D to be int eh high normal range.   Ms. Chila lives in her own home. Her niece lives next door. She has stopped driving on her own. Today she used a ride service from [place].  Ms. Minarik is having some acute nausea and vomiting this morning. She notes she took her morning meds with only a small amount of food. She has had problems int he past when she takes her Preservision AREDs without having eaten a meal first. The vomiting started acutely when she was in our parking lot.  Past Medical History: Patient Active Problem List   Diagnosis Date Noted   Arthritis of left hip 11/15/2020   Type 2 diabetes mellitus with stage 3b chronic kidney disease and hypertension ([place]) 05/14/2019   Osteoporosis 07/19/2017   GERD (gastroesophageal reflux disease), on Omeprazole 10/15/2016   Macular degeneration 10/15/2016   Essential hypertension 09/01/2016    Hyperlipidemia 09/01/2016   LBBB (left bundle branch block) 09/01/2016   Chronic kidney disease, stage 3b ([place]) 09/01/2016   No past surgical history on file.  Family History  Problem Relation Age of Onset   Heart disease Mother    Stroke Father    Hypertension Father    Early death Brother    Diabetes Brother    Heart disease Brother    Heart disease Brother    Heart disease Paternal Grandmother    Cancer Maternal Aunt        Ovarian   Outpatient Medications Prior to Visit  Medication Sig Dispense Refill   ketorolac (ACULAR) 0.5 % ophthalmic solution Place into the left eye.     losartan (COZAAR) 25 MG tablet Take 1 tablet (25 mg total) by mouth daily. 90 tablet 0   Multiple Vitamins-Minerals (PRESERVISION AREDS 2 PO) Take by mouth.     Omega-3 Fatty Acids (FISH OIL) 1000 MG CAPS Take 1,000 mg by mouth 2 (two) times daily.     omeprazole (PRILOSEC) 40 MG capsule TAKE 1 CAPSULE(40 MG) BY MOUTH DAILY 90 capsule 0   pravastatin (PRAVACHOL) 20 MG tablet Take 1 tablet (20 mg total) by mouth daily. 90 tablet 0   trimethoprim-polymyxin b (POLYTRIM) ophthalmic solution INT 1 GTT INTO OS QID 2 DAYS AFTER EACH MONTHLY INJECTION  12   No facility-administered medications prior to visit.   Allergies  Allergen Reactions  Amlodipine Other (See Comments)    Pedal edema   Lisinopril-Hydrochlorothiazide Swelling    Angioedema   Penicillins     Has patient had a PCN reaction causing immediate rash, facial/tongue/throat swelling, SOB or lightheadedness with hypotension: Yes Has patient had a PCN reaction causing severe rash involving mucus membranes or skin necrosis: Unknown Has patient had a PCN reaction that required hospitalization: Unknown Has patient had a PCN reaction occurring within the last 10 years: No If all of the above answers are "NO", then may proceed with Cephalosporin use.     Objective:   Today's Vitals   05/31/21 0805  BP: (!) 144/80  Pulse: (!) 110  Temp: 97.8 F  (36.6 C)  TempSrc: Temporal  SpO2: 95%  Weight: 141 lb 12.8 oz (64.3 kg)  Height: 5\' 2"  (1.575 m)   Body mass index is 25.94 kg/m.   General: Well developed, well nourished. No acute distress, but having some intermittent episodes of vomiting. CV: RRR without murmurs or rubs. Pulses 2+ bilaterally. Psych: Alert and oriented. Normal mood and affect.  Health Maintenance Due  Topic Date Due   Zoster Vaccines- Shingrix (1 of 2) Never done   Pneumonia Vaccine 59+ Years old (1 - PCV) Never done   COVID-19 Vaccine (4 - Booster for Pfizer series) 03/03/2020   Lab Results Last lipids Lab Results  Component Value Date   CHOL 150 02/28/2021   HDL 54.50 02/28/2021   LDLCALC 80 02/28/2021   TRIG 80.0 02/28/2021   CHOLHDL 3 02/28/2021   Last hemoglobin A1c Lab Results  Component Value Date   HGBA1C 6.4 02/28/2021   BMP Latest Ref Rng & Units 02/28/2021 11/15/2020 07/15/2020  Glucose 70 - 99 mg/dL 99 91 96  BUN 6 - 23 mg/dL - 18 17  Creatinine 0.40 - 1.20 mg/dL - 1.37(H) 1.26(H)  BUN/Creat Ratio 6 - 22 (calc) - - -  Sodium 135 - 145 mEq/L - 138 139  Potassium 3.5 - 5.1 mEq/L - 4.3 4.5  Chloride 96 - 112 mEq/L - 101 102  CO2 19 - 32 mEq/L - 28 30  Calcium 8.4 - 10.5 mg/dL - 9.4 9.3      Assessment & Plan:   1. Type 2 diabetes mellitus with stage 3b chronic kidney disease and hypertension ([place]) Ms. Vasbinder diabetes has been in excellent control with diet management. We will check quarterly labs. She is UTD with annual screenings related to her diabetes.  - Glucose, random - Hemoglobin A1c  2. Chronic kidney disease, stage 3b (HCC) Stable. Blood pressure is mildly elevated this morning, but likely exacerbated by her acute vomiting.  3. Essential hypertension Blood pressure mildly high, but having acute vomiting. We will continue amlodipine.  4. Mixed hyperlipidemia At goal on pravastatin. Will recheck lipids in Nov.  5. Acute nausea with nonbilious vomiting Likely due to  taking her meds on a less than full stomach. I will provide a small supply of Zofran.  - ondansetron (ZOFRAN-ODT) 4 MG disintegrating tablet; Take 1 tablet (4 mg total) by mouth every 8 (eight) hours as needed for nausea or vomiting.  Dispense: 5 tablet; Refill: 0   Return in about 3 months (around 08/28/2021) for Reassessment.    Haydee Salter, MD

## 2021-06-13 ENCOUNTER — Other Ambulatory Visit: Payer: Self-pay

## 2021-06-13 ENCOUNTER — Encounter (INDEPENDENT_AMBULATORY_CARE_PROVIDER_SITE_OTHER): Payer: Medicare HMO | Admitting: Ophthalmology

## 2021-06-13 DIAGNOSIS — H35033 Hypertensive retinopathy, bilateral: Secondary | ICD-10-CM

## 2021-06-13 DIAGNOSIS — I10 Essential (primary) hypertension: Secondary | ICD-10-CM

## 2021-06-13 DIAGNOSIS — H43813 Vitreous degeneration, bilateral: Secondary | ICD-10-CM | POA: Diagnosis not present

## 2021-06-13 DIAGNOSIS — H353231 Exudative age-related macular degeneration, bilateral, with active choroidal neovascularization: Secondary | ICD-10-CM

## 2021-07-26 ENCOUNTER — Encounter (INDEPENDENT_AMBULATORY_CARE_PROVIDER_SITE_OTHER): Payer: Medicare HMO | Admitting: Ophthalmology

## 2021-07-26 DIAGNOSIS — H43813 Vitreous degeneration, bilateral: Secondary | ICD-10-CM

## 2021-07-26 DIAGNOSIS — I10 Essential (primary) hypertension: Secondary | ICD-10-CM

## 2021-07-26 DIAGNOSIS — H353231 Exudative age-related macular degeneration, bilateral, with active choroidal neovascularization: Secondary | ICD-10-CM | POA: Diagnosis not present

## 2021-07-26 DIAGNOSIS — H35033 Hypertensive retinopathy, bilateral: Secondary | ICD-10-CM | POA: Diagnosis not present

## 2021-07-31 ENCOUNTER — Other Ambulatory Visit: Payer: Self-pay | Admitting: Family Medicine

## 2021-07-31 DIAGNOSIS — K219 Gastro-esophageal reflux disease without esophagitis: Secondary | ICD-10-CM

## 2021-07-31 DIAGNOSIS — I10 Essential (primary) hypertension: Secondary | ICD-10-CM

## 2021-08-31 ENCOUNTER — Ambulatory Visit (INDEPENDENT_AMBULATORY_CARE_PROVIDER_SITE_OTHER): Payer: Medicare HMO | Admitting: Family Medicine

## 2021-08-31 VITALS — BP 130/76 | HR 76 | Temp 97.0°F | Ht 62.0 in | Wt 145.0 lb

## 2021-08-31 DIAGNOSIS — N1832 Chronic kidney disease, stage 3b: Secondary | ICD-10-CM | POA: Diagnosis not present

## 2021-08-31 DIAGNOSIS — I129 Hypertensive chronic kidney disease with stage 1 through stage 4 chronic kidney disease, or unspecified chronic kidney disease: Secondary | ICD-10-CM

## 2021-08-31 DIAGNOSIS — E1122 Type 2 diabetes mellitus with diabetic chronic kidney disease: Secondary | ICD-10-CM

## 2021-08-31 DIAGNOSIS — E782 Mixed hyperlipidemia: Secondary | ICD-10-CM | POA: Diagnosis not present

## 2021-08-31 DIAGNOSIS — L659 Nonscarring hair loss, unspecified: Secondary | ICD-10-CM | POA: Diagnosis not present

## 2021-08-31 DIAGNOSIS — I10 Essential (primary) hypertension: Secondary | ICD-10-CM

## 2021-08-31 LAB — URINALYSIS, ROUTINE W REFLEX MICROSCOPIC
Bilirubin Urine: NEGATIVE
Hgb urine dipstick: NEGATIVE
Ketones, ur: NEGATIVE
Nitrite: NEGATIVE
RBC / HPF: NONE SEEN (ref 0–?)
Specific Gravity, Urine: 1.01 (ref 1.000–1.030)
Total Protein, Urine: NEGATIVE
Urine Glucose: NEGATIVE
Urobilinogen, UA: 0.2 (ref 0.0–1.0)
pH: 7.5 (ref 5.0–8.0)

## 2021-08-31 LAB — LIPID PANEL
Cholesterol: 185 mg/dL (ref 0–200)
HDL: 70.4 mg/dL (ref >39.00)
LDL Cholesterol: 99 mg/dL (ref 0–99)
NonHDL: 114.86
Total CHOL/HDL Ratio: 3
Triglycerides: 81 mg/dL (ref 0.0–149.0)
VLDL: 16.2 mg/dL (ref 0.0–40.0)

## 2021-08-31 LAB — HEMOGLOBIN A1C: Hgb A1c MFr Bld: 6.4 % (ref 4.6–6.5)

## 2021-08-31 LAB — MICROALBUMIN / CREATININE URINE RATIO
Creatinine,U: 60.5 mg/dL
Microalb Creat Ratio: 1.7 mg/g (ref 0.0–30.0)
Microalb, Ur: 1 mg/dL (ref 0.0–1.9)

## 2021-08-31 LAB — BASIC METABOLIC PANEL
BUN: 18 mg/dL (ref 6–23)
CO2: 31 mEq/L (ref 19–32)
Calcium: 9.5 mg/dL (ref 8.4–10.5)
Chloride: 104 mEq/L (ref 96–112)
Creatinine, Ser: 1.25 mg/dL — ABNORMAL HIGH (ref 0.40–1.20)
GFR: 38.31 mL/min — ABNORMAL LOW (ref >60.00)
Glucose, Bld: 91 mg/dL (ref 70–99)
Potassium: 3.8 mEq/L (ref 3.5–5.1)
Sodium: 142 mEq/L (ref 135–145)

## 2021-08-31 NOTE — Progress Notes (Signed)
[facility] [address] [place] [address] Dept: [phone] Dept Fax: [phone]  Chronic Care Office Visit  Subjective:    Patient ID: Whitney Davidson, female    DOB: 12/07/1930, 88 y.o..   MRN: [id number]  Chief Complaint  Patient presents with   Follow-up    3 month f/u. , no concerns.  Fasting today.     History of Present Illness:  Patient is in today for reassessment of chronic medical issues. she is accompanied by her niece.  Whitney Davidson has a history of hypertension. She is currently managed on losartan. She also has Stage 3b CKD. This has been stable.   Whitney Davidson has a history of hyperlipidemia. She is managed on pravastatin.   Whitney Davidson has a history of Type 2 diabetes.  This is currently diet-controlled.   Whitney Davidson asks about her hair loss. She notes this has gone on for quite some time. She does get itchiness of her scalp at times and finds herself scratching. Her niece feels this has contributed to her hair loss.  Past Medical History: Patient Active Problem List   Diagnosis Date Noted   Arthritis of left hip 11/15/2020   Type 2 diabetes mellitus with stage 3b chronic kidney disease and hypertension ([place]) 05/14/2019   Osteoporosis 07/19/2017   GERD (gastroesophageal reflux disease), on Omeprazole 10/15/2016   Macular degeneration 10/15/2016   Essential hypertension 09/01/2016   Hyperlipidemia 09/01/2016   LBBB (left bundle branch block) 09/01/2016   Chronic kidney disease, stage 3b ([place]) 09/01/2016   No past surgical history on file.  Family History  Problem Relation Age of Onset   Heart disease Mother    Stroke Father    Hypertension Father    Early death Brother    Diabetes Brother    Heart disease Brother    Heart disease Brother    Heart disease Paternal Grandmother    Cancer Maternal Aunt        Ovarian   Outpatient Medications Prior to Visit  Medication Sig Dispense Refill    ketorolac (ACULAR) 0.5 % ophthalmic solution Place into the left eye.     losartan (COZAAR) 25 MG tablet TAKE 1 TABLET EVERY DAY 90 tablet 3   Multiple Vitamins-Minerals (PRESERVISION AREDS 2 PO) Take by mouth.     Omega-3 Fatty Acids (FISH OIL) 1000 MG CAPS Take 1,000 mg by mouth 2 (two) times daily.     omeprazole (PRILOSEC) 40 MG capsule TAKE 1 CAPSULE EVERY DAY 90 capsule 3   ondansetron (ZOFRAN-ODT) 4 MG disintegrating tablet Take 1 tablet (4 mg total) by mouth every 8 (eight) hours as needed for nausea or vomiting. 5 tablet 0   pravastatin (PRAVACHOL) 20 MG tablet TAKE 1 TABLET EVERY DAY 90 tablet 3   trimethoprim-polymyxin b (POLYTRIM) ophthalmic solution INT 1 GTT INTO OS QID 2 DAYS AFTER EACH MONTHLY INJECTION  12   No facility-administered medications prior to visit.   Allergies  Allergen Reactions   Amlodipine Other (See Comments)    Pedal edema   Lisinopril-Hydrochlorothiazide Swelling    Angioedema   Penicillins     Has patient had a PCN reaction causing immediate rash, facial/tongue/throat swelling, SOB or lightheadedness with hypotension: Yes Has patient had a PCN reaction causing severe rash involving mucus membranes or skin necrosis: Unknown Has patient had a PCN reaction that required hospitalization: Unknown Has patient had a PCN reaction occurring within the last 10 years: No If all of  the above answers are "NO", then may proceed with Cephalosporin use.    Objective:   Today's Vitals   08/31/21 0906  BP: 130/76  Pulse: 76  Temp: (!) 97 F (36.1 C)  TempSrc: Temporal  SpO2: 96%  Weight: 145 lb (65.8 kg)  Height: 5\' 2"  (1.575 m)   Body mass index is 26.52 kg/m.   General: Well developed, well nourished. No acute distress. Scalp: The hairs are thin and sparse. There is a mild irritation to the scalp, but no flaking or scarring noted.   There is some recession of the hair line in the anterior scalp. Psych: Alert and oriented. Normal mood and  affect.  Health Maintenance Due  Topic Date Due   Zoster Vaccines- Shingrix (1 of 2) Never done   Pneumonia Vaccine 43+ Years old (1 - PCV) Never done     Assessment & Plan:   1. Type 2 diabetes mellitus with stage 3b chronic kidney disease and hypertension ([place]) Diabetes has been well controlled with diet. Due for annual DM labs.  - Microalbumin / creatinine urine ratio - Basic metabolic panel - Hemoglobin A1c - Urinalysis, Routine w reflex microscopic  2. Chronic kidney disease, stage 3b (HCC) BP is adequately controlled. I am concerned that if we pushed the dose, Ms. Dillahunt would be at risk for orthostasis and falls.  3. Essential hypertension As above. Continue losartan 25 mg daily.  4. Mixed hyperlipidemia We will reassess her lipid control. Continue pravastatin 25 mg daily.  - Lipid panel  5. Hair loss Generalized thinning. I did recommend she try using Selsum Blue shampoo twice a week to control itching.  Return in about 3 months (around 12/01/2021) for Reassessment.   Whitney Salter, MD

## 2021-09-06 ENCOUNTER — Encounter (INDEPENDENT_AMBULATORY_CARE_PROVIDER_SITE_OTHER): Payer: Medicare HMO | Admitting: Ophthalmology

## 2021-09-06 DIAGNOSIS — H43813 Vitreous degeneration, bilateral: Secondary | ICD-10-CM

## 2021-09-06 DIAGNOSIS — H353231 Exudative age-related macular degeneration, bilateral, with active choroidal neovascularization: Secondary | ICD-10-CM

## 2021-09-06 DIAGNOSIS — H35033 Hypertensive retinopathy, bilateral: Secondary | ICD-10-CM

## 2021-09-06 DIAGNOSIS — I10 Essential (primary) hypertension: Secondary | ICD-10-CM | POA: Diagnosis not present

## 2021-10-13 DIAGNOSIS — Z0100 Encounter for examination of eyes and vision without abnormal findings: Secondary | ICD-10-CM | POA: Diagnosis not present

## 2021-10-13 DIAGNOSIS — H353134 Nonexudative age-related macular degeneration, bilateral, advanced atrophic with subfoveal involvement: Secondary | ICD-10-CM | POA: Diagnosis not present

## 2021-10-13 LAB — HM DIABETES EYE EXAM

## 2021-10-18 ENCOUNTER — Encounter (INDEPENDENT_AMBULATORY_CARE_PROVIDER_SITE_OTHER): Payer: Medicare HMO | Admitting: Ophthalmology

## 2021-10-18 DIAGNOSIS — H353231 Exudative age-related macular degeneration, bilateral, with active choroidal neovascularization: Secondary | ICD-10-CM | POA: Diagnosis not present

## 2021-10-18 DIAGNOSIS — H35033 Hypertensive retinopathy, bilateral: Secondary | ICD-10-CM | POA: Diagnosis not present

## 2021-10-18 DIAGNOSIS — H43813 Vitreous degeneration, bilateral: Secondary | ICD-10-CM

## 2021-10-18 DIAGNOSIS — I10 Essential (primary) hypertension: Secondary | ICD-10-CM | POA: Diagnosis not present

## 2021-11-09 ENCOUNTER — Other Ambulatory Visit: Payer: Self-pay | Admitting: Family Medicine

## 2021-11-09 DIAGNOSIS — K219 Gastro-esophageal reflux disease without esophagitis: Secondary | ICD-10-CM

## 2021-11-29 ENCOUNTER — Encounter (INDEPENDENT_AMBULATORY_CARE_PROVIDER_SITE_OTHER): Payer: Medicare HMO | Admitting: Ophthalmology

## 2021-11-29 DIAGNOSIS — H353231 Exudative age-related macular degeneration, bilateral, with active choroidal neovascularization: Secondary | ICD-10-CM

## 2021-11-29 DIAGNOSIS — H43813 Vitreous degeneration, bilateral: Secondary | ICD-10-CM

## 2021-11-29 DIAGNOSIS — H35033 Hypertensive retinopathy, bilateral: Secondary | ICD-10-CM

## 2021-11-29 DIAGNOSIS — I10 Essential (primary) hypertension: Secondary | ICD-10-CM | POA: Diagnosis not present

## 2021-12-01 ENCOUNTER — Ambulatory Visit (INDEPENDENT_AMBULATORY_CARE_PROVIDER_SITE_OTHER): Payer: Medicare HMO | Admitting: Family Medicine

## 2021-12-01 ENCOUNTER — Encounter: Payer: Self-pay | Admitting: Family Medicine

## 2021-12-01 VITALS — BP 140/80 | HR 78 | Temp 97.6°F | Ht 62.0 in | Wt 147.2 lb

## 2021-12-01 DIAGNOSIS — E1122 Type 2 diabetes mellitus with diabetic chronic kidney disease: Secondary | ICD-10-CM | POA: Diagnosis not present

## 2021-12-01 DIAGNOSIS — R6889 Other general symptoms and signs: Secondary | ICD-10-CM | POA: Diagnosis not present

## 2021-12-01 DIAGNOSIS — E782 Mixed hyperlipidemia: Secondary | ICD-10-CM

## 2021-12-01 DIAGNOSIS — I129 Hypertensive chronic kidney disease with stage 1 through stage 4 chronic kidney disease, or unspecified chronic kidney disease: Secondary | ICD-10-CM

## 2021-12-01 DIAGNOSIS — I10 Essential (primary) hypertension: Secondary | ICD-10-CM | POA: Diagnosis not present

## 2021-12-01 DIAGNOSIS — N1832 Chronic kidney disease, stage 3b: Secondary | ICD-10-CM | POA: Diagnosis not present

## 2021-12-01 LAB — HEMOGLOBIN A1C: Hgb A1c MFr Bld: 6.3 % (ref 4.6–6.5)

## 2021-12-01 LAB — GLUCOSE, RANDOM: Glucose, Bld: 99 mg/dL (ref 70–99)

## 2021-12-01 NOTE — Progress Notes (Signed)
[facility] [address] [place] [address] Dept: [phone] Dept Fax: [phone]  Chronic Care Office Visit  Subjective:    Patient ID: Whitney Davidson, female    DOB: Jul 10, 1930, 88 y.o..   MRN: [id number]  Chief Complaint  Patient presents with   Follow-up    3 month f/u.  No concerns.  Fasting today.     History of Present Illness:  Patient is in today for reassessment of chronic medical issues.  Whitney Davidson has a history of hypertension. She is currently managed on losartan 25 mg daily. She also has Stage 3b CKD. This has been stable.   Whitney Davidson has a history of hyperlipidemia. She is managed on pravastatin 20 mg daily.   Whitney Davidson has a history of Type 2 diabetes.  This is currently diet-controlled.  Whitney Davidson has a history of macular degeneration. She continues to see Whitney Davidson who is giving her eye injections. her last one was 2 days ago.   Past Medical History: Patient Active Problem List   Diagnosis Date Noted   Arthritis of left hip 11/15/2020   Type 2 diabetes mellitus with stage 3b chronic kidney disease and hypertension (HCC) 05/14/2019   Osteoporosis 07/19/2017   GERD (gastroesophageal reflux disease), on Omeprazole 10/15/2016   Macular degeneration 10/15/2016   Essential hypertension 09/01/2016   Hyperlipidemia 09/01/2016   LBBB (left bundle branch block) 09/01/2016   Chronic kidney disease, stage 3b (HCC) 09/01/2016   History reviewed. No pertinent surgical history.  Family History  Problem Relation Age of Onset   Heart disease Mother    Stroke Father    Hypertension Father    Early death Brother    Diabetes Brother    Heart disease Brother    Heart disease Brother    Heart disease Paternal Grandmother    Cancer Maternal Aunt        Ovarian   Outpatient Medications Prior to Visit  Medication Sig Dispense Refill   ketorolac (ACULAR) 0.5 % ophthalmic solution Place into the left eye.      losartan (COZAAR) 25 MG tablet TAKE 1 TABLET EVERY DAY 90 tablet 3   Multiple Vitamins-Minerals (PRESERVISION AREDS 2 PO) Take by mouth.     Omega-3 Fatty Acids (FISH OIL) 1000 MG CAPS Take 1,000 mg by mouth 2 (two) times daily.     omeprazole (PRILOSEC) 40 MG capsule TAKE 1 CAPSULE EVERY DAY 90 capsule 3   ondansetron (ZOFRAN-ODT) 4 MG disintegrating tablet Take 1 tablet (4 mg total) by mouth every 8 (eight) hours as needed for nausea or vomiting. 5 tablet 0   pravastatin (PRAVACHOL) 20 MG tablet TAKE 1 TABLET EVERY DAY 90 tablet 3   trimethoprim-polymyxin b (POLYTRIM) ophthalmic solution INT 1 GTT INTO OS QID 2 DAYS AFTER EACH MONTHLY INJECTION  12   No facility-administered medications prior to visit.   Allergies  Allergen Reactions   Amlodipine Other (See Comments)    Pedal edema   Lisinopril-Hydrochlorothiazide Swelling    Angioedema   Penicillins     Has patient had a PCN reaction causing immediate rash, facial/tongue/throat swelling, SOB or lightheadedness with hypotension: Yes Has patient had a PCN reaction causing severe rash involving mucus membranes or skin necrosis: Unknown Has patient had a PCN reaction that required hospitalization: Unknown Has patient had a PCN reaction occurring within the last 10 years: No If all of the above answers are "NO", then may proceed with Cephalosporin use.  Objective:   Today's Vitals   12/01/21 0836  BP: (!) 140/80  Pulse: 78  Temp: 97.6 F (36.4 C)  TempSrc: Temporal  SpO2: 94%  Weight: 147 lb 3.2 oz (66.8 kg)  Height: 5\' 2"  (1.575 m)   Body mass index is 26.92 kg/m.   General: Well developed, well nourished. No acute distress. Psych: Alert and oriented. Normal mood and affect.  Health Maintenance Due  Topic Date Due   Zoster Vaccines- Shingrix (1 of 2) Never done   Pneumonia Vaccine 82+ Years old (1 - PCV) Never done   OPHTHALMOLOGY EXAM  11/22/2021   Lab Results Last lipids Lab Results  Component Value Date    CHOL 185 08/31/2021   HDL 70.40 08/31/2021   LDLCALC 99 08/31/2021   TRIG 81.0 08/31/2021   CHOLHDL 3 08/31/2021   Last hemoglobin A1c Lab Results  Component Value Date   HGBA1C 6.4 08/31/2021     Assessment & Plan:   1. Type 2 diabetes mellitus with stage 3b chronic kidney disease and hypertension (HCC) Diabetes remains in good control with dietary modification alone. We will recheck the A1c today.  2. Essential hypertension Blood pressure is mildly high today. Whitney Davidson usually notices this after receiving her eye injection. She does get occasional orthostasis. I will plant o watch this for now.  3. Mixed hyperlipidemia Lipids have been at goal. Continue pravastatin 20 mg daily.   Return in about 3 months (around 03/03/2022) for Reassessment.   03/05/2022, MD

## 2021-12-05 ENCOUNTER — Telehealth: Payer: Self-pay | Admitting: Family Medicine

## 2021-12-05 NOTE — Telephone Encounter (Signed)
Left message for patient to call back and schedule Medicare Annual Wellness Visit (AWV) .   Please offer to do virtually or by telephone.  Left office number and my jabber #[phone].  Last AWV:12/13/2020  Please schedule at anytime with Nurse Health Advisor.   

## 2022-01-03 ENCOUNTER — Encounter (INDEPENDENT_AMBULATORY_CARE_PROVIDER_SITE_OTHER): Payer: Medicare HMO | Admitting: Ophthalmology

## 2022-01-03 DIAGNOSIS — I10 Essential (primary) hypertension: Secondary | ICD-10-CM | POA: Diagnosis not present

## 2022-01-03 DIAGNOSIS — H35033 Hypertensive retinopathy, bilateral: Secondary | ICD-10-CM

## 2022-01-03 DIAGNOSIS — H43813 Vitreous degeneration, bilateral: Secondary | ICD-10-CM

## 2022-01-03 DIAGNOSIS — H353231 Exudative age-related macular degeneration, bilateral, with active choroidal neovascularization: Secondary | ICD-10-CM | POA: Diagnosis not present

## 2022-01-16 ENCOUNTER — Ambulatory Visit (INDEPENDENT_AMBULATORY_CARE_PROVIDER_SITE_OTHER): Payer: Medicare HMO

## 2022-01-16 VITALS — Ht 62.0 in | Wt 147.0 lb

## 2022-01-16 DIAGNOSIS — Z0000 Encounter for general adult medical examination without abnormal findings: Secondary | ICD-10-CM | POA: Diagnosis not present

## 2022-01-16 NOTE — Patient Instructions (Signed)
Whitney Davidson , Thank you for taking time to come for your Medicare Wellness Visit. I appreciate your ongoing commitment to your health goals. Please review the following plan we discussed and let me know if I can assist you in the future.   These are the goals we discussed:  Goals      Increase physical activity     Walk 6 days a week/ 45 minutes     Patient Stated     Continue current lifestyle        This is a list of the screening recommended for you and due dates:  Health Maintenance  Topic Date Due   Zoster (Shingles) Vaccine (1 of 2) Never done   Pneumonia Vaccine (1 - PCV) Never done   COVID-19 Vaccine (4 - Pfizer series) 03/03/2020   Eye exam for diabetics  11/22/2021   Complete foot exam   12/19/2021   Hemoglobin A1C  06/03/2022   HPV Vaccine  Aged Out   Flu Shot  Discontinued   DEXA scan (bone density measurement)  Discontinued   Tetanus Vaccine  Discontinued    Advanced directives: Please bring a copy of your health care power of attorney and living will to the office to be added to your chart at your convenience.   Conditions/risks identified: Aim for 30 minutes of exercise or brisk walking, 6-8 glasses of water, and 5 servings of fruits and vegetables each day.   Next appointment: Follow up in one year for your annual wellness visit    Preventive Care 65 Years and Older, Female Preventive care refers to lifestyle choices and visits with your health care provider that can promote health and wellness. What does preventive care include? A yearly physical exam. This is also called an annual well check. Dental exams once or twice a year. Routine eye exams. Ask your health care provider how often you should have your eyes checked. Personal lifestyle choices, including: Daily care of your teeth and gums. Regular physical activity. Eating a healthy diet. Avoiding tobacco and drug use. Limiting alcohol use. Practicing safe sex. Taking low-dose aspirin every  day. Taking vitamin and mineral supplements as recommended by your health care provider. What happens during an annual well check? The services and screenings done by your health care provider during your annual well check will depend on your age, overall health, lifestyle risk factors, and family history of disease. Counseling  Your health care provider may ask you questions about your: Alcohol use. Tobacco use. Drug use. Emotional well-being. Home and relationship well-being. Sexual activity. Eating habits. History of falls. Memory and ability to understand (cognition). Work and work Statistician. Reproductive health. Screening  You may have the following tests or measurements: Height, weight, and BMI. Blood pressure. Lipid and cholesterol levels. These may be checked every 5 years, or more frequently if you are over 75 years old. Skin check. Lung cancer screening. You may have this screening every year starting at age 29 if you have a 30-pack-year history of smoking and currently smoke or have quit within the past 15 years. Fecal occult blood test (FOBT) of the stool. You may have this test every year starting at age 51. Flexible sigmoidoscopy or colonoscopy. You may have a sigmoidoscopy every 5 years or a colonoscopy every 10 years starting at age 59. Hepatitis C blood test. Hepatitis B blood test. Sexually transmitted disease (STD) testing. Diabetes screening. This is done by checking your blood sugar (glucose) after you have not eaten for  a while (fasting). You may have this done every 1-3 years. Bone density scan. This is done to screen for osteoporosis. You may have this done starting at age 72. Mammogram. This may be done every 1-2 years. Talk to your health care provider about how often you should have regular mammograms. Talk with your health care provider about your test results, treatment options, and if necessary, the need for more tests. Vaccines  Your health care  provider may recommend certain vaccines, such as: Influenza vaccine. This is recommended every year. Tetanus, diphtheria, and acellular pertussis (Tdap, Td) vaccine. You may need a Td booster every 10 years. Zoster vaccine. You may need this after age 35. Pneumococcal 13-valent conjugate (PCV13) vaccine. One dose is recommended after age 5. Pneumococcal polysaccharide (PPSV23) vaccine. One dose is recommended after age 60. Talk to your health care provider about which screenings and vaccines you need and how often you need them. This information is not intended to replace advice given to you by your health care provider. Make sure you discuss any questions you have with your health care provider. Document Released: 04/22/2015 Document Revised: 12/14/2015 Document Reviewed: 01/25/2015 Elsevier Interactive Patient Education  2017 [employer] Prevention in the Home Falls can cause injuries. They can happen to people of all ages. There are many things you can do to make your home safe and to help prevent falls. What can I do on the outside of my home? Regularly fix the edges of walkways and driveways and fix any cracks. Remove anything that might make you trip as you walk through a door, such as a raised step or threshold. Trim any bushes or trees on the path to your home. Use bright outdoor lighting. Clear any walking paths of anything that might make someone trip, such as rocks or tools. Regularly check to see if handrails are loose or broken. Make sure that both sides of any steps have handrails. Any raised decks and porches should have guardrails on the edges. Have any leaves, snow, or ice cleared regularly. Use sand or salt on walking paths during winter. Clean up any spills in your garage right away. This includes oil or grease spills. What can I do in the bathroom? Use night lights. Install grab bars by the toilet and in the tub and shower. Do not use towel bars as grab  bars. Use non-skid mats or decals in the tub or shower. If you need to sit down in the shower, use a plastic, non-slip stool. Keep the floor dry. Clean up any water that spills on the floor as soon as it happens. Remove soap buildup in the tub or shower regularly. Attach bath mats securely with double-sided non-slip rug tape. Do not have throw rugs and other things on the floor that can make you trip. What can I do in the bedroom? Use night lights. Make sure that you have a light by your bed that is easy to reach. Do not use any sheets or blankets that are too big for your bed. They should not hang down onto the floor. Have a firm chair that has side arms. You can use this for support while you get dressed. Do not have throw rugs and other things on the floor that can make you trip. What can I do in the kitchen? Clean up any spills right away. Avoid walking on wet floors. Keep items that you use a lot in easy-to-reach places. If you need to reach something above  you, use a strong step stool that has a grab bar. Keep electrical cords out of the way. Do not use floor polish or wax that makes floors slippery. If you must use wax, use non-skid floor wax. Do not have throw rugs and other things on the floor that can make you trip. What can I do with my stairs? Do not leave any items on the stairs. Make sure that there are handrails on both sides of the stairs and use them. Fix handrails that are broken or loose. Make sure that handrails are as long as the stairways. Check any carpeting to make sure that it is firmly attached to the stairs. Fix any carpet that is loose or worn. Avoid having throw rugs at the top or bottom of the stairs. If you do have throw rugs, attach them to the floor with carpet tape. Make sure that you have a light switch at the top of the stairs and the bottom of the stairs. If you do not have them, ask someone to add them for you. What else can I do to help prevent  falls? Wear shoes that: Do not have high heels. Have rubber bottoms. Are comfortable and fit you well. Are closed at the toe. Do not wear sandals. If you use a stepladder: Make sure that it is fully opened. Do not climb a closed stepladder. Make sure that both sides of the stepladder are locked into place. Ask someone to hold it for you, if possible. Clearly mark and make sure that you can see: Any grab bars or handrails. First and last steps. Where the edge of each step is. Use tools that help you move around (mobility aids) if they are needed. These include: Canes. Walkers. Scooters. Crutches. Turn on the lights when you go into a dark area. Replace any light bulbs as soon as they burn out. Set up your furniture so you have a clear path. Avoid moving your furniture around. If any of your floors are uneven, fix them. If there are any pets around you, be aware of where they are. Review your medicines with your doctor. Some medicines can make you feel dizzy. This can increase your chance of falling. Ask your doctor what other things that you can do to help prevent falls. This information is not intended to replace advice given to you by your health care provider. Make sure you discuss any questions you have with your health care provider. Document Released: 01/20/2009 Document Revised: 09/01/2015 Document Reviewed: 04/30/2014 Elsevier Interactive Patient Education  2017 [employer].

## 2022-01-16 NOTE — Progress Notes (Signed)
Subjective:   Whitney Davidson is a 88 y.o. female who presents for Medicare Annual (Subsequent) preventive examination.   Virtual Visit via Telephone Note  I connected with  Whitney Davidson on 01/16/22 at  3:15 PM EDT by telephone and verified that I am speaking with the correct person using two identifiers.  Location: Patient: Home  Provider: [place]  Persons participating in the virtual visit: patient/Nurse Health Advisor   I discussed the limitations, risks, security and privacy concerns of performing an evaluation and management service by telephone and the availability of in person appointments. The patient expressed understanding and agreed to proceed.  Interactive audio and video telecommunications were attempted between this nurse and patient, however failed, due to patient having technical difficulties OR patient did not have access to video capability.  We continued and completed visit with audio only.  Some vital signs may be absent or patient reported.   Daphane Shepherd, LPN  Review of Systems     Cardiac Risk Factors include: advanced age (>18men, >49 women);hypertension     Objective:    Today's Vitals   01/16/22 1456  Weight: 147 lb (66.7 kg)  Height: 5\' 2"  (1.575 m)   Body mass index is 26.89 kg/m.     01/16/2022    3:01 PM 12/13/2020    8:44 AM 08/07/2019   12:06 PM 10/29/2017   11:22 AM 09/02/2016    5:03 AM  Advanced Directives  Does Patient Have a Medical Advance Directive? Yes Yes Yes Yes No  Type of Paramedic of [place];Living will Healthcare Power of [place] of [place];Living will   Does patient want to make changes to medical advance directive?   No - Patient declined No - Patient declined   Copy of [place] in Chart? No - copy requested No - copy requested No - copy requested No - copy requested   Would patient like information on creating a medical advance  directive?     No - Patient declined    Current Medications (verified) Outpatient Encounter Medications as of 01/16/2022  Medication Sig   ketorolac (ACULAR) 0.5 % ophthalmic solution Place into the left eye.   losartan (COZAAR) 25 MG tablet TAKE 1 TABLET EVERY DAY   Multiple Vitamins-Minerals (PRESERVISION AREDS 2 PO) Take by mouth.   Omega-3 Fatty Acids (FISH OIL) 1000 MG CAPS Take 1,000 mg by mouth 2 (two) times daily.   omeprazole (PRILOSEC) 40 MG capsule TAKE 1 CAPSULE EVERY DAY   ondansetron (ZOFRAN-ODT) 4 MG disintegrating tablet Take 1 tablet (4 mg total) by mouth every 8 (eight) hours as needed for nausea or vomiting.   pravastatin (PRAVACHOL) 20 MG tablet TAKE 1 TABLET EVERY DAY   trimethoprim-polymyxin b (POLYTRIM) ophthalmic solution INT 1 GTT INTO OS QID 2 DAYS AFTER EACH MONTHLY INJECTION   No facility-administered encounter medications on file as of 01/16/2022.    Allergies (verified) Amlodipine, Lisinopril-hydrochlorothiazide, and Penicillins   History: Past Medical History:  Diagnosis Date   Anemia 10/15/2016   CKD (chronic kidney disease) stage 3, GFR 30-59 ml/min (HCC) 09/01/2016   GERD (gastroesophageal reflux disease) 10/15/2016   High cholesterol    Hypertension    LBBB (left bundle branch block) 09/01/2016   Macular degeneration 10/15/2016   Syncope 09/01/2016   Due to dehydration. See ER visit on 09/01/2016.   Vitamin D deficiency 10/15/2016   History reviewed. No pertinent surgical history. Family History  Problem Relation  Age of Onset   Heart disease Mother    Stroke Father    Hypertension Father    Early death Brother    Diabetes Brother    Heart disease Brother    Heart disease Brother    Heart disease Paternal Grandmother    Cancer Maternal Aunt        Ovarian   Social History   Socioeconomic History   Marital status: Widowed    Spouse name: Not on file   Number of children: 0   Years of education: Not on file   Highest education  level: Not on file  Occupational History   Occupation: Retired     Comment: IT trainer, Proofreader  Tobacco Use   Smoking status: Never   Smokeless tobacco: Never  Scientific laboratory technician Use: Never used  Substance and Sexual Activity   Alcohol use: No   Drug use: No   Sexual activity: Not Currently    Birth control/protection: Post-menopausal  Other Topics Concern   Not on file  Social History Narrative   Lives alone   Doesn't drive    Niece lives nearby and assists and transports    Social Determinants of Health   Financial Resource Strain: Cut Off  (01/16/2022)   Overall Financial Resource Strain (CARDIA)    Difficulty of Paying Living Expenses: Not hard at all  Food Insecurity: No Food Insecurity (01/16/2022)   Hunger Vital Sign    Worried About Running Out of Food in the Last Year: Never true    [place] in the Last Year: Never true  Transportation Needs: No Transportation Needs (01/16/2022)   PRAPARE - Hydrologist (Medical): No    Lack of Transportation (Non-Medical): No  Physical Activity: Insufficiently Active (01/16/2022)   Exercise Vital Sign    Days of Exercise per Week: 3 days    Minutes of Exercise per Session: 30 min  Stress: No Stress Concern Present (01/16/2022)   [place]    Feeling of Stress : Not at all  Social Connections: Moderately Integrated (01/16/2022)   Social Connection and Isolation Panel [NHANES]    Frequency of Communication with Friends and Family: More than three times a week    Frequency of Social Gatherings with Friends and Family: More than three times a week    Attends Religious Services: More than 4 times per year    Active Member of [employer] or Organizations: Yes    Attends Archivist Meetings: More than 4 times per year    Marital Status: Widowed    Tobacco Counseling Counseling given:  Not Answered   Clinical Intake:  Pre-visit preparation completed: Yes  Pain : No/denies pain     Nutritional Risks: None Diabetes: No  How often do you need to have someone help you when you read instructions, pamphlets, or other written materials from your doctor or pharmacy?: 1 - Never  Diabetic?no per patient   Interpreter Needed?: No  Information entered by :: Jadene Pierini, LPN   Activities of Daily Living    01/16/2022    3:01 PM  In your present state of health, do you have any difficulty performing the following activities:  Hearing? 0  Vision? 0  Difficulty concentrating or making decisions? 0  Walking or climbing stairs? 0  Dressing or bathing? 0  Doing errands, shopping? 0  Preparing Food and eating ? N  Using the Toilet? N  In the past six months, have you accidently leaked urine? N  Do you have problems with loss of bowel control? N  Managing your Medications? N  Managing your Finances? N  Housekeeping or managing your Housekeeping? N    Patient Care Team: Haydee Salter, MD as PCP - General (Family Medicine) Hayden Pedro, MD as Consulting Physician (Ophthalmology)  Indicate any recent Medical Services you may have received from other than [facility] providers in the past year (date may be approximate).     Assessment:   This is a routine wellness examination for Whitney Davidson.  Hearing/Vision screen Vision Screening - Comments:: Annual eye exam wear glasses   Dietary issues and exercise activities discussed: Current Exercise Habits: Home exercise routine, Type of exercise: walking, Time (Minutes): 30, Frequency (Times/Week): 3, Weekly Exercise (Minutes/Week): 90, Intensity: Mild, Exercise limited by: None identified   Goals Addressed             This Visit's Progress    Increase physical activity   On track    Walk 6 days a week/ 45 minutes       Depression Screen    01/16/2022    2:59 PM 05/31/2021    8:02 AM 12/13/2020    8:53 AM  02/25/2020    8:07 AM 01/25/2020    9:27 AM 08/07/2019   12:08 PM 05/11/2019    8:12 AM  PHQ 2/9 Scores  PHQ - 2 Score 0 0 0 0 0 0 0  PHQ- 9 Score       3    Fall Risk    01/16/2022    2:57 PM 05/31/2021    8:02 AM 12/13/2020    8:38 AM 07/15/2020    8:19 AM 02/25/2020    8:07 AM  [place] in the past year? 0 0 0 0 0  Number falls in past yr: 0 0 0    Injury with Fall? 0 0 0    Risk for fall due to : No Fall Risks No Fall Risks     Follow up Falls prevention discussed Falls evaluation completed Falls evaluation completed;Education provided;Falls prevention discussed      FALL RISK PREVENTION PERTAINING TO THE HOME:  Any stairs in or around the home? No  If so, are there any without handrails? No  Home free of loose throw rugs in walkways, pet beds, electrical cords, etc? Yes  Adequate lighting in your home to reduce risk of falls? Yes   ASSISTIVE DEVICES UTILIZED TO PREVENT FALLS:  Life alert? No  Use of a cane, walker or w/c? No  Grab bars in the bathroom? No  Shower chair or bench in shower? No  Elevated toilet seat or a handicapped toilet? No       01/16/2022    3:02 PM 08/07/2019   12:07 PM 10/29/2017   11:33 AM  6CIT Screen  What Year? 0 points 0 points 0 points  What month? 0 points 0 points 0 points  What time? 0 points 0 points 0 points  Count back from 20 0 points 0 points 0 points  Months in reverse 0 points 0 points 0 points  Repeat phrase 0 points 0 points   Total Score 0 points 0 points     Immunizations Immunization History  Administered Date(s) Administered   Fluad Quad(high Dose 65+) 12/19/2020   Influenza-Unspecified 04/09/2018   PFIZER(Purple Top)SARS-COV-2 Vaccination 05/23/2019, 06/17/2019, 01/07/2020    TDAP  status: Due, Education has been provided regarding the importance of this vaccine. Advised may receive this vaccine at local pharmacy or Health Dept. Aware to provide a copy of the vaccination record if obtained from local  pharmacy or Health Dept. Verbalized acceptance and understanding.  Flu Vaccine status: Due, Education has been provided regarding the importance of this vaccine. Advised may receive this vaccine at local pharmacy or Health Dept. Aware to provide a copy of the vaccination record if obtained from local pharmacy or Health Dept. Verbalized acceptance and understanding.  Pneumococcal vaccine status: Up to date  Covid-19 vaccine status: Completed vaccines  Qualifies for Shingles Vaccine? Yes   Zostavax completed No   Shingrix Completed?: No.    Education has been provided regarding the importance of this vaccine. Patient has been advised to call insurance company to determine out of pocket expense if they have not yet received this vaccine. Advised may also receive vaccine at local pharmacy or Health Dept. Verbalized acceptance and understanding.  Screening Tests Health Maintenance  Topic Date Due   Zoster Vaccines- Shingrix (1 of 2) Never done   Pneumonia Vaccine 65+ Years old (1 - PCV) Never done   COVID-19 Vaccine (4 - Pfizer series) 03/03/2020   OPHTHALMOLOGY EXAM  11/22/2021   FOOT EXAM  12/19/2021   HEMOGLOBIN A1C  06/03/2022   HPV VACCINES  Aged Out   INFLUENZA VACCINE  Discontinued   DEXA SCAN  Discontinued   TETANUS/TDAP  Discontinued    Health Maintenance  Health Maintenance Due  Topic Date Due   Zoster Vaccines- Shingrix (1 of 2) Never done   Pneumonia Vaccine 64+ Years old (1 - PCV) Never done   COVID-19 Vaccine (4 - Pfizer series) 03/03/2020   OPHTHALMOLOGY EXAM  11/22/2021   FOOT EXAM  12/19/2021    Colorectal cancer screening: No longer required.   Mammogram status: No longer required due to age.  Bone Density status: Completed 04/30/2017. Results reflect: Bone density results: OSTEOPENIA. Repeat every 5 years.  Lung Cancer Screening: (Low Dose CT Chest recommended if Age 64-80 years, 30 pack-year currently smoking OR have quit w/in 15years.) does not qualify.    Lung Cancer Screening Referral: n/a  Additional Screening:  Hepatitis C Screening: does not qualify;  Vision Screening: Recommended annual ophthalmology exams for early detection of glaucoma and other disorders of the eye. Is the patient up to date with their annual eye exam?  Yes  Who is the provider or what is the name of the office in which the patient attends annual eye exams? [facility]  If pt is not established with a provider, would they like to be referred to a provider to establish care? No .   Dental Screening: Recommended annual dental exams for proper oral hygiene  Community Resource Referral / Chronic Care Management: CRR required this visit?  No   CCM required this visit?  No      Plan:     I have personally reviewed and noted the following in the patient's chart:   Medical and social history Use of alcohol, tobacco or illicit drugs  Current medications and supplements including opioid prescriptions. Patient is not currently taking opioid prescriptions. Functional ability and status Nutritional status Physical activity Advanced directives List of other physicians Hospitalizations, surgeries, and ER visits in previous 12 months Vitals Screenings to include cognitive, depression, and falls Referrals and appointments  In addition, I have reviewed and discussed with patient certain preventive protocols, quality metrics, and best practice  recommendations. A written personalized care plan for preventive services as well as general preventive health recommendations were provided to patient.     Daphane Shepherd, LPN   [id number]   Nurse Notes: Due TDAP /Flu vaccine

## 2022-02-07 ENCOUNTER — Encounter (INDEPENDENT_AMBULATORY_CARE_PROVIDER_SITE_OTHER): Payer: Medicare HMO | Admitting: Ophthalmology

## 2022-02-07 DIAGNOSIS — H43813 Vitreous degeneration, bilateral: Secondary | ICD-10-CM

## 2022-02-07 DIAGNOSIS — I10 Essential (primary) hypertension: Secondary | ICD-10-CM

## 2022-02-07 DIAGNOSIS — H35033 Hypertensive retinopathy, bilateral: Secondary | ICD-10-CM | POA: Diagnosis not present

## 2022-02-07 DIAGNOSIS — H353231 Exudative age-related macular degeneration, bilateral, with active choroidal neovascularization: Secondary | ICD-10-CM | POA: Diagnosis not present

## 2022-03-07 ENCOUNTER — Ambulatory Visit: Payer: Medicare HMO | Admitting: Family Medicine

## 2022-03-09 ENCOUNTER — Ambulatory Visit (INDEPENDENT_AMBULATORY_CARE_PROVIDER_SITE_OTHER): Payer: Medicare HMO | Admitting: Family Medicine

## 2022-03-09 VITALS — BP 134/70 | HR 75 | Temp 97.0°F | Ht 62.0 in | Wt 146.0 lb

## 2022-03-09 DIAGNOSIS — E782 Mixed hyperlipidemia: Secondary | ICD-10-CM

## 2022-03-09 DIAGNOSIS — I129 Hypertensive chronic kidney disease with stage 1 through stage 4 chronic kidney disease, or unspecified chronic kidney disease: Secondary | ICD-10-CM

## 2022-03-09 DIAGNOSIS — E1122 Type 2 diabetes mellitus with diabetic chronic kidney disease: Secondary | ICD-10-CM | POA: Diagnosis not present

## 2022-03-09 DIAGNOSIS — I10 Essential (primary) hypertension: Secondary | ICD-10-CM

## 2022-03-09 DIAGNOSIS — N1832 Chronic kidney disease, stage 3b: Secondary | ICD-10-CM | POA: Diagnosis not present

## 2022-03-09 LAB — HEMOGLOBIN A1C: Hgb A1c MFr Bld: 6.4 % (ref 4.6–6.5)

## 2022-03-09 LAB — GLUCOSE, RANDOM: Glucose, Bld: 96 mg/dL (ref 70–99)

## 2022-03-09 NOTE — Progress Notes (Signed)
[facility]ARE [facility] [address] [place] [address] Dept: [phone] Dept Fax: [phone]  Chronic Care Office Visit  Subjective:    Patient ID: Whitney Davidson, female    DOB: Feb 06, 1931, 88 y.o..   MRN: [id number]  Chief Complaint  Patient presents with   Follow-up    3 month f/u.   No concerns.   Fasting today.     History of Present Illness:  Patient is in today for reassessment of chronic medical issues.  Ms. Jasso has a history of hypertension. She is currently managed on losartan 25 mg daily. She also has Stage 3b CKD. This has been stable.   Ms. Calkin has a history of hyperlipidemia. She is managed on pravastatin 20 mg daily.   Ms. Watters has a history of Type 2 diabetes.  This is currently diet-controlled.  Past Medical History: Patient Active Problem List   Diagnosis Date Noted   Arthritis of left hip 11/15/2020   Type 2 diabetes mellitus with stage 3b chronic kidney disease and hypertension (HCC) 05/14/2019   Osteoporosis 07/19/2017   GERD (gastroesophageal reflux disease), on Omeprazole 10/15/2016   Macular degeneration 10/15/2016   Essential hypertension 09/01/2016   Hyperlipidemia 09/01/2016   LBBB (left bundle branch block) 09/01/2016   Chronic kidney disease, stage 3b (HCC) 09/01/2016   No past surgical history on file.  Family History  Problem Relation Age of Onset   Heart disease Mother    Stroke Father    Hypertension Father    Early death Brother    Diabetes Brother    Heart disease Brother    Heart disease Brother    Heart disease Paternal Grandmother    Cancer Maternal Aunt        Ovarian   Outpatient Medications Prior to Visit  Medication Sig Dispense Refill   ketorolac (ACULAR) 0.5 % ophthalmic solution Place into the left eye.     losartan (COZAAR) 25 MG tablet TAKE 1 TABLET EVERY DAY 90 tablet 3   Omega-3 Fatty Acids (FISH OIL) 1000 MG CAPS Take 1,000 mg by mouth 2 (two) times daily.      omeprazole (PRILOSEC) 40 MG capsule TAKE 1 CAPSULE EVERY DAY 90 capsule 3   ondansetron (ZOFRAN-ODT) 4 MG disintegrating tablet Take 1 tablet (4 mg total) by mouth every 8 (eight) hours as needed for nausea or vomiting. 5 tablet 0   pravastatin (PRAVACHOL) 20 MG tablet TAKE 1 TABLET EVERY DAY 90 tablet 3   trimethoprim-polymyxin b (POLYTRIM) ophthalmic solution INT 1 GTT INTO OS QID 2 DAYS AFTER EACH MONTHLY INJECTION  12   Multiple Vitamins-Minerals (PRESERVISION AREDS 2 PO) Take by mouth. (Patient not taking: Reported on 03/09/2022)     No facility-administered medications prior to visit.   Allergies  Allergen Reactions   Amlodipine Other (See Comments)    Pedal edema   Lisinopril-Hydrochlorothiazide Swelling    Angioedema   Penicillins     Has patient had a PCN reaction causing immediate rash, facial/tongue/throat swelling, SOB or lightheadedness with hypotension: Yes Has patient had a PCN reaction causing severe rash involving mucus membranes or skin necrosis: Unknown Has patient had a PCN reaction that required hospitalization: Unknown Has patient had a PCN reaction occurring within the last 10 years: No If all of the above answers are "NO", then may proceed with Cephalosporin use.      Objective:   Today's Vitals   03/09/22 0821  BP: 134/70  Pulse: 75  Temp: Marland Kitchen)  97 F (36.1 C)  TempSrc: Temporal  SpO2: 98%  Weight: 146 lb (66.2 kg)  Height: 5\' 2"  (1.575 m)   Body mass index is 26.7 kg/m.   General: Well developed, well nourished. No acute distress. Feet- Skin intact. No sign of maceration between toes. Nails are normal. Dorsalis pedis and   posterior tibial artery pulses are normal. 5.07 monofilament testing normal. Psych: Alert and oriented. Normal mood and affect.  Health Maintenance Due  Topic Date Due   DTaP/Tdap/Td (1 - Tdap) Never done   Zoster Vaccines- Shingrix (1 of 2) Never done   Pneumonia Vaccine 50+ Years old (1 - PCV) Never done     Assessment &  Plan:   1. Essential hypertension Blood pressure is adequately controled. I will continue losartan 25 mg daily.  2. Type 2 diabetes mellitus with stage 3b chronic kidney disease and hypertension (HCC) Diabetes has been well managed with dietary measures. I will reassess ehr A1c today. Foot exam completed.  - Glucose, random - Hemoglobin A1c  3. Mixed hyperlipidemia Continue pravastatin 20 mg daily.   Return in about 3 months (around 06/08/2022) for Reassessment.   08/08/2022, MD

## 2022-03-14 ENCOUNTER — Encounter (INDEPENDENT_AMBULATORY_CARE_PROVIDER_SITE_OTHER): Payer: Medicare HMO | Admitting: Ophthalmology

## 2022-03-14 DIAGNOSIS — I10 Essential (primary) hypertension: Secondary | ICD-10-CM

## 2022-03-14 DIAGNOSIS — H43813 Vitreous degeneration, bilateral: Secondary | ICD-10-CM

## 2022-03-14 DIAGNOSIS — H35033 Hypertensive retinopathy, bilateral: Secondary | ICD-10-CM

## 2022-03-14 DIAGNOSIS — H353231 Exudative age-related macular degeneration, bilateral, with active choroidal neovascularization: Secondary | ICD-10-CM | POA: Diagnosis not present

## 2022-04-18 ENCOUNTER — Encounter (INDEPENDENT_AMBULATORY_CARE_PROVIDER_SITE_OTHER): Payer: Medicare HMO | Admitting: Ophthalmology

## 2022-04-18 DIAGNOSIS — I10 Essential (primary) hypertension: Secondary | ICD-10-CM

## 2022-04-18 DIAGNOSIS — H35033 Hypertensive retinopathy, bilateral: Secondary | ICD-10-CM

## 2022-04-18 DIAGNOSIS — H353231 Exudative age-related macular degeneration, bilateral, with active choroidal neovascularization: Secondary | ICD-10-CM

## 2022-04-18 DIAGNOSIS — H43813 Vitreous degeneration, bilateral: Secondary | ICD-10-CM | POA: Diagnosis not present

## 2022-05-31 ENCOUNTER — Encounter (INDEPENDENT_AMBULATORY_CARE_PROVIDER_SITE_OTHER): Payer: Medicare HMO | Admitting: Ophthalmology

## 2022-05-31 DIAGNOSIS — H35033 Hypertensive retinopathy, bilateral: Secondary | ICD-10-CM | POA: Diagnosis not present

## 2022-05-31 DIAGNOSIS — H43813 Vitreous degeneration, bilateral: Secondary | ICD-10-CM | POA: Diagnosis not present

## 2022-05-31 DIAGNOSIS — I10 Essential (primary) hypertension: Secondary | ICD-10-CM

## 2022-05-31 DIAGNOSIS — H353231 Exudative age-related macular degeneration, bilateral, with active choroidal neovascularization: Secondary | ICD-10-CM

## 2022-06-08 ENCOUNTER — Ambulatory Visit (INDEPENDENT_AMBULATORY_CARE_PROVIDER_SITE_OTHER): Payer: Medicare HMO | Admitting: Family Medicine

## 2022-06-08 ENCOUNTER — Encounter: Payer: Self-pay | Admitting: Family Medicine

## 2022-06-08 VITALS — BP 140/66 | HR 77 | Temp 97.9°F | Ht 62.0 in | Wt 150.2 lb

## 2022-06-08 DIAGNOSIS — I129 Hypertensive chronic kidney disease with stage 1 through stage 4 chronic kidney disease, or unspecified chronic kidney disease: Secondary | ICD-10-CM | POA: Diagnosis not present

## 2022-06-08 DIAGNOSIS — N1832 Chronic kidney disease, stage 3b: Secondary | ICD-10-CM | POA: Diagnosis not present

## 2022-06-08 DIAGNOSIS — E1122 Type 2 diabetes mellitus with diabetic chronic kidney disease: Secondary | ICD-10-CM

## 2022-06-08 DIAGNOSIS — H1013 Acute atopic conjunctivitis, bilateral: Secondary | ICD-10-CM | POA: Diagnosis not present

## 2022-06-08 DIAGNOSIS — E782 Mixed hyperlipidemia: Secondary | ICD-10-CM

## 2022-06-08 DIAGNOSIS — I10 Essential (primary) hypertension: Secondary | ICD-10-CM

## 2022-06-08 DIAGNOSIS — R6889 Other general symptoms and signs: Secondary | ICD-10-CM | POA: Diagnosis not present

## 2022-06-08 DIAGNOSIS — G5711 Meralgia paresthetica, right lower limb: Secondary | ICD-10-CM

## 2022-06-08 LAB — HEMOGLOBIN A1C: Hgb A1c MFr Bld: 6.3 % (ref 4.6–6.5)

## 2022-06-08 LAB — GLUCOSE, RANDOM: Glucose, Bld: 92 mg/dL (ref 70–99)

## 2022-06-08 NOTE — Assessment & Plan Note (Signed)
I recommend she try using olopatadine drops. If not improving, she can follow up with her eye doctor.

## 2022-06-08 NOTE — Assessment & Plan Note (Signed)
Continue pravastatin 20 mg daily. 

## 2022-06-08 NOTE — Assessment & Plan Note (Signed)
Stable. Continue to focus on blood pressure control.

## 2022-06-08 NOTE — Assessment & Plan Note (Signed)
We will observe this for now. She can try using ice over this when flared and should continue gentle massage.

## 2022-06-08 NOTE — Assessment & Plan Note (Signed)
Systolic blood pressure is mildly high today. At other times she is adequately controlled. I recommend we monitor this for now, as she is near 52. I would be concerned with orthostasis and falls if we tried to push her dose too much. I will continue losartan 25 mg daily.

## 2022-06-08 NOTE — Assessment & Plan Note (Signed)
Diet-controlled. We will repeat an A1c today.

## 2022-06-08 NOTE — Progress Notes (Signed)
[facility] [address] [place] [address] Dept: [phone] Dept Fax: [phone]  Chronic Care Office Visit  Subjective:    Patient ID: Whitney Davidson, female    DOB: 1930-06-03, 89 y.o..   MRN: [id number]  Chief Complaint  Patient presents with   Medical Management of Chronic Issues    3 month f/u.  Fasting today.     History of Present Illness:  Patient is in today for reassessment of chronic medical issues.  Whitney Davidson has a history of hypertension. She is currently managed on losartan 25 mg daily. She also has Stage 3b CKD. This has been stable.   Whitney Davidson has a history of hyperlipidemia. She is managed on pravastatin 20 mg daily.   Whitney Davidson has a history of Type 2 diabetes.  This is currently diet-controlled.  Whitney Davidson notes that she has had allergic eye issues since childhood. She has noted some mattering of the eyes more recently.  Whitney Davidson notes she is getting up a few times at night to urinate. She denies any dysuria. She does not want any medication for this.  Whitney Davidson notes she is having an intermittent issue with stinging/burning sensation in her right lateral thigh area. This improves if she massages over top of this.  Past Medical History: Patient Active Problem List   Diagnosis Date Noted   Allergic conjunctivitis of both eyes 06/08/2022   Meralgia paraesthetica, right 06/08/2022   Arthritis of left hip 11/15/2020   Type 2 diabetes mellitus with stage 3b chronic kidney disease and hypertension ([place]) 05/14/2019   Osteoporosis 07/19/2017   GERD (gastroesophageal reflux disease), on Omeprazole 10/15/2016   Macular degeneration 10/15/2016   Essential hypertension 09/01/2016   Hyperlipidemia 09/01/2016   LBBB (left bundle branch block) 09/01/2016   Chronic kidney disease, stage 3b ([place]) 09/01/2016   History reviewed. No pertinent surgical history. Family History  Problem Relation Age of Onset    Heart disease Mother    Stroke Father    Hypertension Father    Early death Brother    Diabetes Brother    Heart disease Brother    Heart disease Brother    Heart disease Paternal Grandmother    Cancer Maternal Aunt        Ovarian   Outpatient Medications Prior to Visit  Medication Sig Dispense Refill   ketorolac (ACULAR) 0.5 % ophthalmic solution Place into the left eye.     losartan (COZAAR) 25 MG tablet TAKE 1 TABLET EVERY DAY 90 tablet 3   Omega-3 Fatty Acids (FISH OIL) 1000 MG CAPS Take 1,000 mg by mouth 2 (two) times daily.     omeprazole (PRILOSEC) 40 MG capsule TAKE 1 CAPSULE EVERY DAY 90 capsule 3   ondansetron (ZOFRAN-ODT) 4 MG disintegrating tablet Take 1 tablet (4 mg total) by mouth every 8 (eight) hours as needed for nausea or vomiting. 5 tablet 0   pravastatin (PRAVACHOL) 20 MG tablet TAKE 1 TABLET EVERY DAY 90 tablet 3   trimethoprim-polymyxin b (POLYTRIM) ophthalmic solution INT 1 GTT INTO OS QID 2 DAYS AFTER EACH MONTHLY INJECTION  12   No facility-administered medications prior to visit.   Allergies  Allergen Reactions   Amlodipine Other (See Comments)    Pedal edema   Lisinopril-Hydrochlorothiazide Swelling    Angioedema   Penicillins     Has patient had a PCN reaction causing immediate rash, facial/tongue/throat swelling, SOB or lightheadedness with hypotension: Yes Has patient had a PCN  reaction causing severe rash involving mucus membranes or skin necrosis: Unknown Has patient had a PCN reaction that required hospitalization: Unknown Has patient had a PCN reaction occurring within the last 10 years: No If all of the above answers are "NO", then may proceed with Cephalosporin use.   Objective:   Today's Vitals   06/08/22 0836  BP: (!) 142/66  Pulse: 77  Temp: 97.9 F (36.6 C)  TempSrc: Temporal  SpO2: 99%  Weight: 150 lb 3.2 oz (68.1 kg)  Height: '5\' 2"'$  (1.575 m)   Body mass index is 27.47 kg/m.   General: Well developed, well nourished. No  acute distress. HEENT:  PERRL, EOMI. Conjunctiva mildly red. No mattering at present.  Extremities: Pain indicated over superolateral aspect of the right thigh. No tenderness to palpation. Psych: Alert and oriented. Normal mood and affect.  Health Maintenance Due  Topic Date Due   DTaP/Tdap/Td (1 - Tdap) Never done   Zoster Vaccines- Shingrix (1 of 2) Never done   Pneumonia Vaccine 26+ Years old (1 of 1 - PCV) Never done     Assessment & Plan:   Problem List Items Addressed This Visit       Cardiovascular and Mediastinum   Essential hypertension    Systolic blood pressure is mildly high today. At other times she is adequately controlled. I recommend we monitor this for now, as she is near 81. I would be concerned with orthostasis and falls if we tried to push her dose too much. I will continue losartan 25 mg daily.      Type 2 diabetes mellitus with stage 3b chronic kidney disease and hypertension ([place]) - Primary    Diet-controlled. We will repeat an A1c today.      Relevant Orders   Glucose, random   Hemoglobin A1c     Nervous and Auditory   Meralgia paraesthetica, right    We will observe this for now. She can try using ice over this when flared and should continue gentle massage.        Genitourinary   Chronic kidney disease, stage 3b ([place])    Stable. Continue to focus on blood pressure control.        Other   Hyperlipidemia    Continue pravastatin 20 mg daily.      Allergic conjunctivitis of both eyes    I recommend she try using olopatadine drops. If not improving, she can follow up with her eye doctor.       Return in about 3 months (around 09/08/2022) for Reassessment.   Haydee Salter, MD

## 2022-06-13 DIAGNOSIS — H903 Sensorineural hearing loss, bilateral: Secondary | ICD-10-CM | POA: Diagnosis not present

## 2022-06-13 DIAGNOSIS — H6123 Impacted cerumen, bilateral: Secondary | ICD-10-CM | POA: Diagnosis not present

## 2022-07-12 ENCOUNTER — Encounter (INDEPENDENT_AMBULATORY_CARE_PROVIDER_SITE_OTHER): Payer: Medicare HMO | Admitting: Ophthalmology

## 2022-07-12 DIAGNOSIS — I10 Essential (primary) hypertension: Secondary | ICD-10-CM | POA: Diagnosis not present

## 2022-07-12 DIAGNOSIS — H35033 Hypertensive retinopathy, bilateral: Secondary | ICD-10-CM

## 2022-07-12 DIAGNOSIS — H43813 Vitreous degeneration, bilateral: Secondary | ICD-10-CM | POA: Diagnosis not present

## 2022-07-12 DIAGNOSIS — H353231 Exudative age-related macular degeneration, bilateral, with active choroidal neovascularization: Secondary | ICD-10-CM | POA: Diagnosis not present

## 2022-07-31 ENCOUNTER — Other Ambulatory Visit: Payer: Self-pay | Admitting: Family Medicine

## 2022-07-31 DIAGNOSIS — K219 Gastro-esophageal reflux disease without esophagitis: Secondary | ICD-10-CM

## 2022-07-31 DIAGNOSIS — R112 Nausea with vomiting, unspecified: Secondary | ICD-10-CM

## 2022-07-31 DIAGNOSIS — I10 Essential (primary) hypertension: Secondary | ICD-10-CM

## 2022-08-23 ENCOUNTER — Encounter (INDEPENDENT_AMBULATORY_CARE_PROVIDER_SITE_OTHER): Payer: Medicare HMO | Admitting: Ophthalmology

## 2022-08-23 DIAGNOSIS — H353231 Exudative age-related macular degeneration, bilateral, with active choroidal neovascularization: Secondary | ICD-10-CM

## 2022-08-23 DIAGNOSIS — H35033 Hypertensive retinopathy, bilateral: Secondary | ICD-10-CM | POA: Diagnosis not present

## 2022-08-23 DIAGNOSIS — H43813 Vitreous degeneration, bilateral: Secondary | ICD-10-CM

## 2022-08-23 DIAGNOSIS — I10 Essential (primary) hypertension: Secondary | ICD-10-CM | POA: Diagnosis not present

## 2022-09-07 ENCOUNTER — Encounter: Payer: Self-pay | Admitting: Family Medicine

## 2022-09-07 ENCOUNTER — Ambulatory Visit (INDEPENDENT_AMBULATORY_CARE_PROVIDER_SITE_OTHER): Payer: Medicare HMO | Admitting: Family Medicine

## 2022-09-07 ENCOUNTER — Telehealth: Payer: Self-pay

## 2022-09-07 VITALS — BP 138/70 | HR 79 | Temp 98.0°F | Ht 62.0 in | Wt 147.8 lb

## 2022-09-07 DIAGNOSIS — E782 Mixed hyperlipidemia: Secondary | ICD-10-CM | POA: Diagnosis not present

## 2022-09-07 DIAGNOSIS — I129 Hypertensive chronic kidney disease with stage 1 through stage 4 chronic kidney disease, or unspecified chronic kidney disease: Secondary | ICD-10-CM | POA: Diagnosis not present

## 2022-09-07 DIAGNOSIS — L669 Cicatricial alopecia, unspecified: Secondary | ICD-10-CM

## 2022-09-07 DIAGNOSIS — R6889 Other general symptoms and signs: Secondary | ICD-10-CM | POA: Diagnosis not present

## 2022-09-07 DIAGNOSIS — G5711 Meralgia paresthetica, right lower limb: Secondary | ICD-10-CM | POA: Diagnosis not present

## 2022-09-07 DIAGNOSIS — N1832 Chronic kidney disease, stage 3b: Secondary | ICD-10-CM

## 2022-09-07 DIAGNOSIS — L6681 Central centrifugal cicatricial alopecia: Secondary | ICD-10-CM | POA: Insufficient documentation

## 2022-09-07 DIAGNOSIS — E1122 Type 2 diabetes mellitus with diabetic chronic kidney disease: Secondary | ICD-10-CM | POA: Diagnosis not present

## 2022-09-07 DIAGNOSIS — I10 Essential (primary) hypertension: Secondary | ICD-10-CM

## 2022-09-07 LAB — CBC
HCT: 41 % (ref 36.0–46.0)
Hemoglobin: 13.1 g/dL (ref 12.0–15.0)
MCHC: 32 g/dL (ref 30.0–36.0)
MCV: 84.8 fl (ref 78.0–100.0)
Platelets: 207 10*3/uL (ref 150.0–400.0)
RBC: 4.83 Mil/uL (ref 3.87–5.11)
RDW: 14.1 % (ref 11.5–15.5)
WBC: 6.1 10*3/uL (ref 4.0–10.5)

## 2022-09-07 LAB — URINALYSIS, ROUTINE W REFLEX MICROSCOPIC
Bilirubin Urine: NEGATIVE
Ketones, ur: NEGATIVE
Nitrite: NEGATIVE
Specific Gravity, Urine: 1.01 (ref 1.000–1.030)
Total Protein, Urine: NEGATIVE
Urine Glucose: 100 — AB
Urobilinogen, UA: 0.2 (ref 0.0–1.0)
pH: 6.5 (ref 5.0–8.0)

## 2022-09-07 LAB — LIPID PANEL
Cholesterol: 176 mg/dL (ref 0–200)
HDL: 54.4 mg/dL (ref >39.00)
LDL Cholesterol: 92 mg/dL (ref 0–99)
NonHDL: 121.59
Total CHOL/HDL Ratio: 3
Triglycerides: 149 mg/dL (ref 0.0–149.0)
VLDL: 29.8 mg/dL (ref 0.0–40.0)

## 2022-09-07 LAB — BASIC METABOLIC PANEL
BUN: 17 mg/dL (ref 6–23)
CO2: 31 mEq/L (ref 19–32)
Calcium: 9.5 mg/dL (ref 8.4–10.5)
Chloride: 100 mEq/L (ref 96–112)
Creatinine, Ser: 1.45 mg/dL — ABNORMAL HIGH (ref 0.40–1.20)
GFR: 31.83 mL/min — ABNORMAL LOW (ref >60.00)
Glucose, Bld: 94 mg/dL (ref 70–99)
Potassium: 4.6 mEq/L (ref 3.5–5.1)
Sodium: 138 mEq/L (ref 135–145)

## 2022-09-07 LAB — HEMOGLOBIN A1C: Hgb A1c MFr Bld: 6.2 % (ref 4.6–6.5)

## 2022-09-07 LAB — MICROALBUMIN / CREATININE URINE RATIO
Creatinine,U: 59.7 mg/dL
Microalb Creat Ratio: 1.2 mg/g (ref 0.0–30.0)
Microalb, Ur: 0.7 mg/dL (ref 0.0–1.9)

## 2022-09-07 LAB — PHOSPHORUS: Phosphorus: 3.8 mg/dL (ref 2.3–4.6)

## 2022-09-07 MED ORDER — SELENIUM SULFIDE 1 % EX LOTN: 1.0000 | TOPICAL_LOTION | CUTANEOUS | 0 refills | Status: AC

## 2022-09-07 NOTE — Assessment & Plan Note (Signed)
Continue pravastatin 20 mg daily. We will check lipids today.

## 2022-09-07 NOTE — Assessment & Plan Note (Signed)
Whitney Davidson raises this as an issue again today. I reinforced advice on using ice over this when flared and continuing gentle massage.

## 2022-09-07 NOTE — Assessment & Plan Note (Signed)
Stable. Continue to focus on blood pressure control. I will check routine labs to assess renal function and monitor for complications.

## 2022-09-07 NOTE — Assessment & Plan Note (Signed)
Diet-controlled. We will check annual DM labs today.

## 2022-09-07 NOTE — Patient Outreach (Signed)
  Care Coordination   In Person Provider Office Visit Note   09/07/2022 Name: Whitney Davidson MRN: [id number] DOB: June 13, 1930  Whitney Davidson is a 89 y.o. year old female who sees Rudd, Bertram Millard, MD for primary care. I engaged with Whitney Davidson in the providers office today.  What matters to the patients health and wellness today?  Stay healthy    Goals Addressed             This Visit's Progress    COMPLETED: Care Coordination activities-no follow up required       Care Coordination Interventions: Advised patient to Annual Wellness exam. Discussed [facility] services and support. Assessed SDOH. Advised to discuss with primary care physician if services needed in the future.         SDOH assessments and interventions completed:  Yes  SDOH Interventions Today    Flowsheet Row Most Recent Value  SDOH Interventions   Housing Interventions Intervention Not Indicated  Transportation Interventions Intervention Not Indicated        Care Coordination Interventions:  Yes, provided   Follow up plan: No further intervention required.   Encounter Outcome:  Pt. Visit Completed   Bary Leriche, RN, MSN [facility] Care Management Care Management Coordinator Direct Line [phone]

## 2022-09-07 NOTE — Progress Notes (Signed)
[facility] [address] [place] [address] Dept: [phone] Dept Fax: [phone]  Chronic Care Office Visit  Subjective:    Patient ID: Whitney Davidson, female    DOB: 17-May-1930, 89 y.o..   MRN: [id number]  Chief Complaint  Patient presents with   Medical Management of Chronic Issues    3 month f/u.  C/o having hair loss.     History of Present Illness:  Patient is in today for reassessment of chronic medical issues.  Whitney Davidson has a history of hypertension. She is currently managed on losartan 25 mg daily. She also has Stage 3b CKD. This has been stable.   Whitney Davidson has a history of hyperlipidemia. She is managed on pravastatin 20 mg daily.   Whitney Davidson describes an event in march, where she misstepped in a [place]emetary. She notes she hear d a popping Whitney Davidson right lower leg. The leg stayed swollen for some time. This is no longer bothering Whitney Davidson, though she feels she still gets mild swelling at times.  Whitney Davidson notes progressive hair loss, itching scalp and dandruff. This has been gradually progressive over years.  Past Medical History: Patient Active Problem List   Diagnosis Date Noted   Allergic conjunctivitis of both eyes 06/08/2022   Meralgia paraesthetica, right 06/08/2022   Arthritis of left hip 11/15/2020   Type 2 diabetes mellitus with stage 3b chronic kidney disease and hypertension (HCC) 05/14/2019   Osteoporosis 07/19/2017   GERD (gastroesophageal reflux disease), on Omeprazole 10/15/2016   Macular degeneration 10/15/2016   Essential hypertension 09/01/2016   Hyperlipidemia 09/01/2016   LBBB (left bundle branch block) 09/01/2016   Chronic kidney disease, stage 3b (HCC) 09/01/2016   History reviewed. No pertinent surgical history. Family History  Problem Relation Age of Onset   Heart disease Mother    Stroke Father    Hypertension Father    Early death Brother    Diabetes Brother    Heart disease Brother     Heart disease Brother    Heart disease Paternal Grandmother    Cancer Maternal Aunt        Ovarian   Outpatient Medications Prior to Visit  Medication Sig Dispense Refill   ketorolac (ACULAR) 0.5 % ophthalmic solution Place into the left eye.     losartan (COZAAR) 25 MG tablet TAKE 1 TABLET EVERY DAY 90 tablet 3   Omega-3 Fatty Acids (FISH OIL) 1000 MG CAPS Take 1,000 mg by mouth 2 (two) times daily.     omeprazole (PRILOSEC) 40 MG capsule TAKE 1 CAPSULE EVERY DAY 90 capsule 3   ondansetron (ZOFRAN-ODT) 4 MG disintegrating tablet DISSOLVE 1 TABLET ON THE TONGUE EVERY 8 (EIGHT) HOURS AS NEEDED FOR NAUSEA OR VOMITING. 5 tablet 0   pravastatin (PRAVACHOL) 20 MG tablet TAKE 1 TABLET EVERY DAY 90 tablet 3   trimethoprim-polymyxin b (POLYTRIM) ophthalmic solution INT 1 GTT INTO OS QID 2 DAYS AFTER EACH MONTHLY INJECTION  12   No facility-administered medications prior to visit.   Allergies  Allergen Reactions   Amlodipine Other (See Comments)    Pedal edema   Lisinopril-Hydrochlorothiazide Swelling    Angioedema   Penicillins     Has patient had a PCN reaction causing immediate rash, facial/tongue/throat swelling, SOB or lightheadedness with hypotension: Yes Has patient had a PCN reaction causing severe rash involving mucus membranes or skin necrosis: Unknown Has patient had a PCN reaction that required hospitalization: Unknown Has patient had a PCN reaction  occurring within the last 10 years: No If all of the above answers are "NO", then may proceed with Cephalosporin use.   Objective:   Today's Vitals   09/07/22 0849 09/07/22 0930  BP: (!) 154/70 138/70  Pulse: 79   Temp: 98 F (36.7 C)   TempSrc: Temporal   SpO2: 100%   Weight: 147 lb 12.8 oz (67 kg)   Height: 5\' 2"  (1.575 m)    Body mass index is 27.03 kg/m.   General: Well developed, well nourished. No acute distress. Extremities: No swelling to right lower leg. No pain on palpation. The ankle has full ROM.  No edema    noted. Tilt and Drawer -. Skin: Warm and dry. There is a mild redness and fine papular nature to the scalp. There is thinning of   the hair on the apex of the scalp. Psych: Alert and oriented. Normal mood and affect.  Health Maintenance Due  Topic Date Due   DTaP/Tdap/Td (1 - Tdap) Never done   Zoster Vaccines- Shingrix (1 of 2) Never done   Pneumonia Vaccine 95+ Years old (1 of 1 - PCV) Never done     Assessment & Plan:   Problem List Items Addressed This Visit       Cardiovascular and Mediastinum   Essential hypertension - Primary    BP improved after sitting. I will continue losartan 25 mg daily.      Type 2 diabetes mellitus with stage 3b chronic kidney disease and hypertension (HCC)    Diet-controlled. We will check annual DM labs today.      Relevant Orders   Microalbumin / creatinine urine ratio   Basic metabolic panel   Hemoglobin A1c   Urinalysis, Routine w reflex microscopic     Nervous and Auditory   Meralgia paraesthetica, right    Whitney Davidson raises this as an issue again today. I reinforced advice on using ice over this when flared and continuing gentle massage.        Musculoskeletal and Integument   Cicatricial alopecia    I will refer Whitney Davidson to dermatology to assess. In the meantime, I will prescribe a selenium shampoo to see if we can calm down the irritation and scaliness.      Relevant Medications   selenium sulfide (SELSUN) 1 % LOTN (Start on 09/10/2022)   Other Relevant Orders   Ambulatory referral to Dermatology     Genitourinary   Chronic kidney disease, stage 3b (HCC)    Stable. Continue to focus on blood pressure control. I will check routine labs to assess renal function and monitor for complications.      Relevant Orders   Microalbumin / creatinine urine ratio   Urinalysis, Routine w reflex microscopic   CBC   Phosphorus   Parathyroid hormone, intact (no Ca)     Other   Hyperlipidemia    Continue pravastatin 20 mg daily. We will  check lipids today.      Relevant Orders   Lipid panel    Return in about 3 months (around 12/08/2022) for Reassessment.   Loyola Mast, MD

## 2022-09-07 NOTE — Patient Instructions (Signed)
Visit Information  Thank you for taking time to visit with me today. Please don't hesitate to contact me if I can be of assistance to you.   Following are the goals we discussed today:   Goals Addressed             This Visit's Progress    COMPLETED: Care Coordination activities-no follow up required       Care Coordination Interventions: Advised patient to Annual Wellness exam. Discussed [facility] services and support. Assessed SDOH. Advised to discuss with primary care physician if services needed in the future.            If you are experiencing a Mental Health or Behavioral Health Crisis or need someone to talk to, please call the Suicide and Crisis Lifeline: 988   The patient verbalized understanding of instructions, educational materials, and care plan provided today and agreed to receive a mailed copy of patient instructions, educational materials, and care plan.   The patient has been provided with contact information for the care management team and has been advised to call with any health related questions or concerns.   Bary Leriche, RN, MSN [facility] Care Management Care Management Coordinator Direct Line [phone]

## 2022-09-07 NOTE — Assessment & Plan Note (Signed)
I will refer her to dermatology to assess. In the meantime, I will prescribe a selenium shampoo to see if we can calm down the irritation and scaliness.

## 2022-09-07 NOTE — Assessment & Plan Note (Addendum)
BP improved after sitting. I will continue losartan 25 mg daily.

## 2022-09-10 LAB — EXTRA LAV TOP TUBE

## 2022-09-10 LAB — PARATHYROID HORMONE, INTACT (NO CA): PTH: 43 pg/mL (ref 16–77)

## 2022-09-14 ENCOUNTER — Telehealth: Payer: Self-pay | Admitting: Family Medicine

## 2022-09-14 NOTE — Telephone Encounter (Signed)
Pt is wanting a cb concerning her most recent labs. Please advise pt at [phone]

## 2022-09-14 NOTE — Telephone Encounter (Signed)
Patient notified VIA phone. Dm/cma  

## 2022-10-03 ENCOUNTER — Telehealth: Payer: Self-pay

## 2022-10-03 NOTE — Telephone Encounter (Signed)
Lmtrc to schedule AWV

## 2022-10-04 ENCOUNTER — Encounter (INDEPENDENT_AMBULATORY_CARE_PROVIDER_SITE_OTHER): Payer: Medicare HMO | Admitting: Ophthalmology

## 2022-10-05 ENCOUNTER — Encounter (INDEPENDENT_AMBULATORY_CARE_PROVIDER_SITE_OTHER): Payer: Medicare HMO | Admitting: Ophthalmology

## 2022-10-05 DIAGNOSIS — I10 Essential (primary) hypertension: Secondary | ICD-10-CM

## 2022-10-05 DIAGNOSIS — H353231 Exudative age-related macular degeneration, bilateral, with active choroidal neovascularization: Secondary | ICD-10-CM

## 2022-10-05 DIAGNOSIS — H43813 Vitreous degeneration, bilateral: Secondary | ICD-10-CM | POA: Diagnosis not present

## 2022-10-05 DIAGNOSIS — H35033 Hypertensive retinopathy, bilateral: Secondary | ICD-10-CM

## 2022-10-15 ENCOUNTER — Ambulatory Visit (INDEPENDENT_AMBULATORY_CARE_PROVIDER_SITE_OTHER): Payer: Medicare HMO

## 2022-10-15 VITALS — Ht 62.0 in | Wt 147.0 lb

## 2022-10-15 DIAGNOSIS — Z0000 Encounter for general adult medical examination without abnormal findings: Secondary | ICD-10-CM | POA: Diagnosis not present

## 2022-10-15 NOTE — Patient Instructions (Signed)
Ms. Whitney Davidson , Thank you for taking time to come for your Medicare Wellness Visit. I appreciate your ongoing commitment to your health goals. Please review the following plan we discussed and let me know if I can assist you in the future.   These are the goals we discussed:  Goals      Increase physical activity     Walk 6 days a week/ 45 minutes     Patient Stated     Continue current lifestyle     Patient Stated     10/15/2022, denies any health goal at time        This is a list of the screening recommended for you and due dates:  Health Maintenance  Topic Date Due   DTaP/Tdap/Td vaccine (1 - Tdap) Never done   Zoster (Shingles) Vaccine (1 of 2) Never done   Pneumonia Vaccine (1 of 1 - PCV) Never done   COVID-19 Vaccine (4 - 2023-24 season) 12/08/2021   Eye exam for diabetics  10/14/2022   Hemoglobin A1C  03/09/2023   Complete foot exam   03/10/2023   Medicare Annual Wellness Visit  10/15/2023   HPV Vaccine  Aged Out   Flu Shot  Discontinued   DEXA scan (bone density measurement)  Discontinued    Advanced directives: Please bring a copy of your POA (Power of [place]) and/or Living Will to your next appointment.   Conditions/risks identified: none  Next appointment: Follow up in one year for your annual wellness visit    Preventive Care 65 Years and Older, Female Preventive care refers to lifestyle choices and visits with your health care provider that can promote health and wellness. What does preventive care include? A yearly physical exam. This is also called an annual well check. Dental exams once or twice a year. Routine eye exams. Ask your health care provider how often you should have your eyes checked. Personal lifestyle choices, including: Daily care of your teeth and gums. Regular physical activity. Eating a healthy diet. Avoiding tobacco and drug use. Limiting alcohol use. Practicing safe sex. Taking low-dose aspirin every day. Taking vitamin and  mineral supplements as recommended by your health care provider. What happens during an annual well check? The services and screenings done by your health care provider during your annual well check will depend on your age, overall health, lifestyle risk factors, and family history of disease. Counseling  Your health care provider may ask you questions about your: Alcohol use. Tobacco use. Drug use. Emotional well-being. Home and relationship well-being. Sexual activity. Eating habits. History of falls. Memory and ability to understand (cognition). Work and work Astronomer. Reproductive health. Screening  You may have the following tests or measurements: Height, weight, and BMI. Blood pressure. Lipid and cholesterol levels. These may be checked every 5 years, or more frequently if you are over 27 years old. Skin check. Lung cancer screening. You may have this screening every year starting at age 70 if you have a 30-pack-year history of smoking and currently smoke or have quit within the past 15 years. Fecal occult blood test (FOBT) of the stool. You may have this test every year starting at age 47. Flexible sigmoidoscopy or colonoscopy. You may have a sigmoidoscopy every 5 years or a colonoscopy every 10 years starting at age 85. Hepatitis C blood test. Hepatitis B blood test. Sexually transmitted disease (STD) testing. Diabetes screening. This is done by checking your blood sugar (glucose) after you have not eaten for a  while (fasting). You may have this done every 1-3 years. Bone density scan. This is done to screen for osteoporosis. You may have this done starting at age 53. Mammogram. This may be done every 1-2 years. Talk to your health care provider about how often you should have regular mammograms. Talk with your health care provider about your test results, treatment options, and if necessary, the need for more tests. Vaccines  Your health care provider may recommend  certain vaccines, such as: Influenza vaccine. This is recommended every year. Tetanus, diphtheria, and acellular pertussis (Tdap, Td) vaccine. You may need a Td booster every 10 years. Zoster vaccine. You may need this after age 18. Pneumococcal 13-valent conjugate (PCV13) vaccine. One dose is recommended after age 2. Pneumococcal polysaccharide (PPSV23) vaccine. One dose is recommended after age 33. Talk to your health care provider about which screenings and vaccines you need and how often you need them. This information is not intended to replace advice given to you by your health care provider. Make sure you discuss any questions you have with your health care provider. Document Released: 04/22/2015 Document Revised: 12/14/2015 Document Reviewed: 01/25/2015 Elsevier Interactive Patient Education  2017 [employer].  Fall Prevention in the Home Falls can cause injuries. They can happen to people of all ages. There are many things you can do to make your home safe and to help prevent falls. What can I do on the outside of my home? Regularly fix the edges of walkways and driveways and fix any cracks. Remove anything that might make you trip as you walk through a door, such as a raised step or threshold. Trim any bushes or trees on the path to your home. Use bright outdoor lighting. Clear any walking paths of anything that might make someone trip, such as rocks or tools. Regularly check to see if handrails are loose or broken. Make sure that both sides of any steps have handrails. Any raised decks and porches should have guardrails on the edges. Have any leaves, snow, or ice cleared regularly. Use sand or salt on walking paths during winter. Clean up any spills in your garage right away. This includes oil or grease spills. What can I do in the bathroom? Use night lights. Install grab bars by the toilet and in the tub and shower. Do not use towel bars as grab bars. Use non-skid mats or  decals in the tub or shower. If you need to sit down in the shower, use a plastic, non-slip stool. Keep the floor dry. Clean up any water that spills on the floor as soon as it happens. Remove soap buildup in the tub or shower regularly. Attach bath mats securely with double-sided non-slip rug tape. Do not have throw rugs and other things on the floor that can make you trip. What can I do in the bedroom? Use night lights. Make sure that you have a light by your bed that is easy to reach. Do not use any sheets or blankets that are too big for your bed. They should not hang down onto the floor. Have a firm chair that has side arms. You can use this for support while you get dressed. Do not have throw rugs and other things on the floor that can make you trip. What can I do in the kitchen? Clean up any spills right away. Avoid walking on wet floors. Keep items that you use a lot in easy-to-reach places. If you need to reach something above you,  use a strong step stool that has a grab bar. Keep electrical cords out of the way. Do not use floor polish or wax that makes floors slippery. If you must use wax, use non-skid floor wax. Do not have throw rugs and other things on the floor that can make you trip. What can I do with my stairs? Do not leave any items on the stairs. Make sure that there are handrails on both sides of the stairs and use them. Fix handrails that are broken or loose. Make sure that handrails are as long as the stairways. Check any carpeting to make sure that it is firmly attached to the stairs. Fix any carpet that is loose or worn. Avoid having throw rugs at the top or bottom of the stairs. If you do have throw rugs, attach them to the floor with carpet tape. Make sure that you have a light switch at the top of the stairs and the bottom of the stairs. If you do not have them, ask someone to add them for you. What else can I do to help prevent falls? Wear shoes that: Do not  have high heels. Have rubber bottoms. Are comfortable and fit you well. Are closed at the toe. Do not wear sandals. If you use a stepladder: Make sure that it is fully opened. Do not climb a closed stepladder. Make sure that both sides of the stepladder are locked into place. Ask someone to hold it for you, if possible. Clearly mark and make sure that you can see: Any grab bars or handrails. First and last steps. Where the edge of each step is. Use tools that help you move around (mobility aids) if they are needed. These include: Canes. Walkers. Scooters. Crutches. Turn on the lights when you go into a dark area. Replace any light bulbs as soon as they burn out. Set up your furniture so you have a clear path. Avoid moving your furniture around. If any of your floors are uneven, fix them. If there are any pets around you, be aware of where they are. Review your medicines with your doctor. Some medicines can make you feel dizzy. This can increase your chance of falling. Ask your doctor what other things that you can do to help prevent falls. This information is not intended to replace advice given to you by your health care provider. Make sure you discuss any questions you have with your health care provider. Document Released: 01/20/2009 Document Revised: 09/01/2015 Document Reviewed: 04/30/2014 Elsevier Interactive Patient Education  2017 [employer].

## 2022-10-15 NOTE — Progress Notes (Signed)
Subjective:   Whitney Davidson is a 89 y.o. female who presents for Medicare Annual (Subsequent) preventive examination.  Visit Complete: Virtual  I connected with  Whitney Davidson on 10/15/22 by a audio enabled telemedicine application and verified that I am speaking with the correct person using two identifiers.  Patient Location: Home  Provider Location: Office/Clinic  I discussed the limitations of evaluation and management by telemedicine. The patient expressed understanding and agreed to proceed.    Review of Systems     Cardiac Risk Factors include: advanced age (>5men, >50 women);diabetes mellitus;dyslipidemia;hypertension     Objective:    Today's Vitals   10/15/22 1456  Weight: 147 lb (66.7 kg)  Height: 5\' 2"  (1.575 m)   Body mass index is 26.89 kg/m.     10/15/2022    3:08 PM 01/16/2022    3:01 PM 12/13/2020    8:44 AM 08/07/2019   12:06 PM 10/29/2017   11:22 AM 09/02/2016    5:03 AM  Advanced Directives  Does Patient Have a Medical Advance Directive? Yes Yes Yes Yes Yes No  Type of Estate agent of [place];Living will Healthcare Power of [place];Living will Healthcare Power of [employer] Power of [employer] Power of [place];Living will   Does patient want to make changes to medical advance directive?    No - Patient declined No - Patient declined   Copy of Healthcare Power of Attorney in Chart? No - copy requested No - copy requested No - copy requested No - copy requested No - copy requested   Would patient like information on creating a medical advance directive?      No - Patient declined    Current Medications (verified) Outpatient Encounter Medications as of 10/15/2022  Medication Sig   ketorolac (ACULAR) 0.5 % ophthalmic solution Place into the left eye.   losartan (COZAAR) 25 MG tablet TAKE 1 TABLET EVERY DAY   Omega-3 Fatty Acids (FISH OIL) 1000 MG CAPS Take 1,000 mg by mouth 2 (two) times daily.   omeprazole (PRILOSEC)  40 MG capsule TAKE 1 CAPSULE EVERY DAY   pravastatin (PRAVACHOL) 20 MG tablet TAKE 1 TABLET EVERY DAY   selenium sulfide (SELSUN) 1 % LOTN Apply 1 Application topically 2 (two) times a week.   trimethoprim-polymyxin b (POLYTRIM) ophthalmic solution INT 1 GTT INTO OS QID 2 DAYS AFTER EACH MONTHLY INJECTION   ondansetron (ZOFRAN-ODT) 4 MG disintegrating tablet DISSOLVE 1 TABLET ON THE TONGUE EVERY 8 (EIGHT) HOURS AS NEEDED FOR NAUSEA OR VOMITING. (Patient not taking: Reported on 10/15/2022)   No facility-administered encounter medications on file as of 10/15/2022.    Allergies (verified) Amlodipine, Lisinopril-hydrochlorothiazide, and Penicillins   History: Past Medical History:  Diagnosis Date   Anemia 10/15/2016   CKD (chronic kidney disease) stage 3, GFR 30-59 ml/min (HCC) 09/01/2016   GERD (gastroesophageal reflux disease) 10/15/2016   High cholesterol    Hypertension    LBBB (left bundle branch block) 09/01/2016   Macular degeneration 10/15/2016   Syncope 09/01/2016   Due to dehydration. See ER visit on 09/01/2016.   Vitamin D deficiency 10/15/2016   History reviewed. No pertinent surgical history. Family History  Problem Relation Age of Onset   Heart disease Mother    Stroke Father    Hypertension Father    Early death Brother    Diabetes Brother    Heart disease Brother    Heart disease Brother    Heart disease Paternal Grandmother    Cancer Maternal  Aunt        Ovarian   Social History   Socioeconomic History   Marital status: Widowed    Spouse name: Not on file   Number of children: 0   Years of education: Not on file   Highest education level: Not on file  Occupational History   Occupation: Retired     Comment: Optometrist, Merchandiser, retail  Tobacco Use   Smoking status: Never   Smokeless tobacco: Never  Building services engineer Use: Never used  Substance and Sexual Activity   Alcohol use: No   Drug use: No   Sexual activity: Not  Currently    Birth control/protection: Post-menopausal  Other Topics Concern   Not on file  Social History Narrative   Lives alone   Doesn't drive    Niece lives nearby and assists and transports    Social Determinants of Health   Financial Resource Strain: Low Risk  (10/15/2022)   Overall Financial Resource Strain (CARDIA)    Difficulty of Paying Living Expenses: Not hard at all  Food Insecurity: No Food Insecurity (10/15/2022)   Hunger Vital Sign    Worried About Running Out of Food in the Last Year: Never true    Ran Out of Food in the Last Year: Never true  Transportation Needs: No Transportation Needs (10/15/2022)   PRAPARE - Administrator, Civil Service (Medical): No    Lack of Transportation (Non-Medical): No  Physical Activity: Inactive (10/15/2022)   Exercise Vital Sign    Days of Exercise per Week: 0 days    Minutes of Exercise per Session: 0 min  Stress: No Stress Concern Present (10/15/2022)   [employer] of Occupational Health - Occupational Stress Questionnaire    Feeling of Stress : Not at all  Social Connections: Moderately Integrated (10/15/2022)   Social Connection and Isolation Panel [NHANES]    Frequency of Communication with Friends and Family: More than three times a week    Frequency of Social Gatherings with Friends and Family: More than three times a week    Attends Religious Services: More than 4 times per year    Active Member of [employer] or Organizations: Yes    Attends Banker Meetings: More than 4 times per year    Marital Status: Widowed    Tobacco Counseling Counseling given: Not Answered   Clinical Intake:  Pre-visit preparation completed: Yes  Pain : No/denies pain     Nutritional Status: BMI 25 -29 Overweight Nutritional Risks: None Diabetes: Yes CBG done?: No Did pt. bring in CBG monitor from home?: No  How often do you need to have someone help you when you read instructions, pamphlets, or other written  materials from your doctor or pharmacy?: 1 - Never  Interpreter Needed?: No  Information entered by :: NAllen LPN   Activities of Daily Living    10/15/2022    2:57 PM 01/16/2022    3:01 PM  In your present state of health, do you have any difficulty performing the following activities:  Hearing? 1 0  Comment has hearing aids   Vision? 0 0  Difficulty concentrating or making decisions? 0 0  Walking or climbing stairs? 0 0  Dressing or bathing? 0 0  Doing errands, shopping? 0 0  Preparing Food and eating ? N N  Using the Toilet? N N  In the past six months, have you accidently leaked urine? N N  Do you have problems  with loss of bowel control? N N  Managing your Medications? N N  Managing your Finances? N N  Housekeeping or managing your Housekeeping? N N    Patient Care Team: Loyola Mast, MD as PCP - General (Family Medicine) Sherrie George, MD as Consulting Physician (Ophthalmology)  Indicate any recent Medical Services you may have received from other than [facility] providers in the past year (date may be approximate).     Assessment:   This is a routine wellness examination for [place].  Hearing/Vision screen Hearing Screening - Comments:: Has hearing aids that are maintained Vision Screening - Comments:: Regular eye exams, Dr. Ashley Royalty  Dietary issues and exercise activities discussed:     Goals Addressed             This Visit's Progress    Patient Stated       10/15/2022, denies any health goal at time       Depression Screen    10/15/2022    3:10 PM 09/07/2022    8:54 AM 01/16/2022    2:59 PM 05/31/2021    8:02 AM 12/13/2020    8:53 AM 02/25/2020    8:07 AM 01/25/2020    9:27 AM  PHQ 2/9 Scores  PHQ - 2 Score 0 0 0 0 0 0 0  PHQ- 9 Score 1          Fall Risk    10/15/2022    3:09 PM 09/07/2022    8:54 AM 01/16/2022    2:57 PM 05/31/2021    8:02 AM 12/13/2020    8:38 AM  Fall Risk   Falls in the past year? 0 0 0 0 0  Number falls in past yr: 0 0 0  0 0  Injury with Fall? 0 0 0 0 0  Risk for fall due to : Medication side effect No Fall Risks No Fall Risks No Fall Risks   Follow up Falls prevention discussed;Falls evaluation completed Falls evaluation completed Falls prevention discussed Falls evaluation completed Falls evaluation completed;Education provided;Falls prevention discussed    MEDICARE RISK AT HOME:  Medicare Risk at Home - 10/15/22 1509     Any stairs in or around the home? No    If so, are there any without handrails? No    Home free of loose throw rugs in walkways, pet beds, electrical cords, etc? Yes    Adequate lighting in your home to reduce risk of falls? Yes    Life alert? No    Use of a cane, walker or w/c? No    Grab bars in the bathroom? No    Shower chair or bench in shower? No    Elevated toilet seat or a handicapped toilet? Yes             TIMED UP AND GO:  Was the test performed?  No    Cognitive Function:        10/15/2022    3:11 PM 01/16/2022    3:02 PM 08/07/2019   12:07 PM 10/29/2017   11:33 AM  6CIT Screen  What Year? 0 points 0 points 0 points 0 points  What month? 0 points 0 points 0 points 0 points  What time? 0 points 0 points 0 points 0 points  Count back from 20 0 points 0 points 0 points 0 points  Months in reverse 0 points 0 points 0 points 0 points  Repeat phrase 0 points 0 points 0 points   Total  Score 0 points 0 points 0 points     Immunizations Immunization History  Administered Date(s) Administered   Fluad Quad(high Dose 65+) 12/19/2020   Influenza-Unspecified 04/09/2018   PFIZER(Purple Top)SARS-COV-2 Vaccination 05/23/2019, 06/17/2019, 01/07/2020    TDAP status: Due, Education has been provided regarding the importance of this vaccine. Advised may receive this vaccine at local pharmacy or Health Dept. Aware to provide a copy of the vaccination record if obtained from local pharmacy or Health Dept. Verbalized acceptance and understanding.  Flu Vaccine status: Up  to date  Pneumococcal vaccine status: Due, Education has been provided regarding the importance of this vaccine. Advised may receive this vaccine at local pharmacy or Health Dept. Aware to provide a copy of the vaccination record if obtained from local pharmacy or Health Dept. Verbalized acceptance and understanding.  Covid-19 vaccine status: Information provided on how to obtain vaccines.   Qualifies for Shingles Vaccine? Yes   Zostavax completed No   Shingrix Completed?: No.    Education has been provided regarding the importance of this vaccine. Patient has been advised to call insurance company to determine out of pocket expense if they have not yet received this vaccine. Advised may also receive vaccine at local pharmacy or Health Dept. Verbalized acceptance and understanding.  Screening Tests Health Maintenance  Topic Date Due   DTaP/Tdap/Td (1 - Tdap) Never done   Zoster Vaccines- Shingrix (1 of 2) Never done   Pneumonia Vaccine 38+ Years old (1 of 1 - PCV) Never done   COVID-19 Vaccine (4 - 2023-24 season) 12/08/2021   OPHTHALMOLOGY EXAM  10/14/2022   HEMOGLOBIN A1C  03/09/2023   FOOT EXAM  03/10/2023   Medicare Annual Wellness (AWV)  10/15/2023   HPV VACCINES  Aged Out   INFLUENZA VACCINE  Discontinued   DEXA SCAN  Discontinued    Health Maintenance  Health Maintenance Due  Topic Date Due   DTaP/Tdap/Td (1 - Tdap) Never done   Zoster Vaccines- Shingrix (1 of 2) Never done   Pneumonia Vaccine 9+ Years old (1 of 1 - PCV) Never done   COVID-19 Vaccine (4 - 2023-24 season) 12/08/2021   OPHTHALMOLOGY EXAM  10/14/2022    Colorectal cancer screening: No longer required.   Mammogram status: No longer required due to age.  Bone Density status: Completed 04/30/2017.   Lung Cancer Screening: (Low Dose CT Chest recommended if Age 87-80 years, 20 pack-year currently smoking OR have quit w/in 15years.) does not qualify.   Lung Cancer Screening Referral: no  Additional  Screening:  Hepatitis C Screening: does not qualify;   Vision Screening: Recommended annual ophthalmology exams for early detection of glaucoma and other disorders of the eye. Is the patient up to date with their annual eye exam?  Yes  Who is the provider or what is the name of the office in which the patient attends annual eye exams? Dr. Ashley Royalty If pt is not established with a provider, would they like to be referred to a provider to establish care? No .   Dental Screening: Recommended annual dental exams for proper oral hygiene  Diabetic Foot Exam: Diabetic Foot Exam: Completed 03/09/2022  Community Resource Referral / Chronic Care Management: CRR required this visit?  No   CCM required this visit?  No     Plan:     I have personally reviewed and noted the following in the patient's chart:   Medical and social history Use of alcohol, tobacco or illicit drugs  Current medications and  supplements including opioid prescriptions. Patient is not currently taking opioid prescriptions. Functional ability and status Nutritional status Physical activity Advanced directives List of other physicians Hospitalizations, surgeries, and ER visits in previous 12 months Vitals Screenings to include cognitive, depression, and falls Referrals and appointments  In addition, I have reviewed and discussed with patient certain preventive protocols, quality metrics, and best practice recommendations. A written personalized care plan for preventive services as well as general preventive health recommendations were provided to patient.     Barb Merino, LPN   04/14/1094   After Visit Summary: (Pick Up) Due to this being a telephonic visit, with patients personalized plan was offered to patient and patient has requested to Pick up at office.  Nurse Notes: none

## 2022-11-16 ENCOUNTER — Encounter (INDEPENDENT_AMBULATORY_CARE_PROVIDER_SITE_OTHER): Payer: Medicare HMO | Admitting: Ophthalmology

## 2022-11-16 DIAGNOSIS — I10 Essential (primary) hypertension: Secondary | ICD-10-CM | POA: Diagnosis not present

## 2022-11-16 DIAGNOSIS — H35033 Hypertensive retinopathy, bilateral: Secondary | ICD-10-CM | POA: Diagnosis not present

## 2022-11-16 DIAGNOSIS — H353231 Exudative age-related macular degeneration, bilateral, with active choroidal neovascularization: Secondary | ICD-10-CM

## 2022-11-16 DIAGNOSIS — H43813 Vitreous degeneration, bilateral: Secondary | ICD-10-CM

## 2022-12-07 ENCOUNTER — Encounter: Payer: Self-pay | Admitting: Family Medicine

## 2022-12-07 ENCOUNTER — Ambulatory Visit: Payer: Medicare HMO | Admitting: Family Medicine

## 2022-12-07 ENCOUNTER — Telehealth: Payer: Self-pay

## 2022-12-07 VITALS — BP 150/80 | HR 76 | Temp 98.4°F | Ht 62.0 in | Wt 149.0 lb

## 2022-12-07 DIAGNOSIS — N1832 Chronic kidney disease, stage 3b: Secondary | ICD-10-CM

## 2022-12-07 DIAGNOSIS — E782 Mixed hyperlipidemia: Secondary | ICD-10-CM

## 2022-12-07 DIAGNOSIS — I10 Essential (primary) hypertension: Secondary | ICD-10-CM

## 2022-12-07 DIAGNOSIS — I129 Hypertensive chronic kidney disease with stage 1 through stage 4 chronic kidney disease, or unspecified chronic kidney disease: Secondary | ICD-10-CM

## 2022-12-07 DIAGNOSIS — E1122 Type 2 diabetes mellitus with diabetic chronic kidney disease: Secondary | ICD-10-CM | POA: Diagnosis not present

## 2022-12-07 LAB — GLUCOSE, RANDOM: Glucose, Bld: 94 mg/dL (ref 70–99)

## 2022-12-07 LAB — HEMOGLOBIN A1C: Hgb A1c MFr Bld: 6.3 % (ref 4.6–6.5)

## 2022-12-07 NOTE — Progress Notes (Signed)
[facility]ARE [facility] [address] [place] [address] Dept: [phone] Dept Fax: [phone]  Chronic Care Office Visit  Subjective:    Patient ID: Whitney Davidson, female    DOB: 19-Jan-1931, 89 y.o..   MRN: [id number]  Chief Complaint  Patient presents with   Hypertension    3 month f/u .  No concerns.    History of Present Illness:  Patient is in today for reassessment of chronic medical issues.  Ms. Sulkowski has a history of hypertension. She is currently managed on losartan 25 mg daily. She also has Stage 3b CKD. This has been stable.   Ms. Hornbaker has a history of hyperlipidemia. She is managed on pravastatin 20 mg daily.  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 No pertinent surgical history. Family History  Problem Relation Age of Onset   Heart disease Mother    Stroke Father    Hypertension Father    Early death Brother    Diabetes Brother    Heart disease Brother    Heart disease Brother    Heart disease Paternal Grandmother    Cancer Maternal Aunt        Ovarian   Outpatient Medications Prior to Visit  Medication Sig Dispense Refill   ketorolac (ACULAR) 0.5 % ophthalmic solution Place into the left eye.     losartan (COZAAR) 25 MG tablet TAKE 1 TABLET EVERY DAY 90 tablet 3   Omega-3 Fatty Acids (FISH OIL) 1000 MG CAPS  Take 1,000 mg by mouth 2 (two) times daily.     omeprazole (PRILOSEC) 40 MG capsule TAKE 1 CAPSULE EVERY DAY 90 capsule 3   ondansetron (ZOFRAN-ODT) 4 MG disintegrating tablet DISSOLVE 1 TABLET ON THE TONGUE EVERY 8 (EIGHT) HOURS AS NEEDED FOR NAUSEA OR VOMITING. 5 tablet 0   pravastatin (PRAVACHOL) 20 MG tablet TAKE 1 TABLET EVERY DAY 90 tablet 3   selenium sulfide (SELSUN) 1 % LOTN Apply 1 Application topically 2 (two) times a week. 118 mL 0   trimethoprim-polymyxin b (POLYTRIM) ophthalmic solution INT 1 GTT INTO OS QID 2 DAYS AFTER EACH MONTHLY INJECTION  12   No facility-administered medications prior to visit.   Allergies  Allergen Reactions   Amlodipine Other (See Comments)    Pedal edema   Lisinopril-Hydrochlorothiazide Swelling    Angioedema   Penicillins     Has patient had a PCN reaction causing immediate rash, facial/tongue/throat swelling, SOB or lightheadedness with hypotension: Yes Has patient had a PCN reaction causing severe rash involving mucus membranes or skin necrosis: Unknown Has patient had a PCN reaction that required hospitalization: Unknown Has patient had a PCN reaction occurring within the last 10 years: No If all of the above answers are "NO", then may proceed with Cephalosporin use.   Objective:   Today's Vitals   12/07/22 0838 12/07/22 0911  BP: (!) 146/82 (!) 150/80  Pulse: 76   Temp: 98.4 F (  36.9 C)   TempSrc: Temporal   SpO2: 97%   Weight: 149 lb (67.6 kg)   Height: 5\' 2"  (1.575 m)    Body mass index is 27.25 kg/m.   General: Well developed, well nourished. No acute distress. Psych: Alert and oriented. Normal mood and affect.  Health Maintenance Due  Topic Date Due   DTaP/Tdap/Td (1 - Tdap) Never done   Zoster Vaccines- Shingrix (1 of 2) Never done   Pneumonia Vaccine 31+ Years old (1 of 1 - PCV) Never done   OPHTHALMOLOGY EXAM  10/14/2022     Assessment & Plan:   Problem List Items Addressed This Visit       Cardiovascular and  Mediastinum   Essential hypertension - Primary    BP is elevated today. She did not take her blood pressure pill this morning, noting she does not like to take this on an empty stomach. I will continue losartan 25 mg daily.      Type 2 diabetes mellitus with stage 3b chronic kidney disease and hypertension (HCC)    Diet-controlled. We will check A1c today.      Relevant Orders   Glucose, random   Hemoglobin A1c     Genitourinary   Chronic kidney disease, stage 3b (HCC)    Stable. Continue to focus on blood pressure control.        Other   Hyperlipidemia    Continue pravastatin 20 mg daily.       Return in about 3 months (around 03/09/2023) for Reassessment.   Loyola Mast, MD

## 2022-12-07 NOTE — Patient Outreach (Signed)
  Care Coordination   In Person Provider Office Visit Note   12/07/2022 Name: Travonna Kosowski MRN: [id number] DOB: 08-Nov-1930  Dilara Wyllie is a 89 y.o. year old female who sees Rudd, Bertram Millard, MD for primary care. I engaged with [name] in the providers office today.  What matters to the patients health and wellness today?  Health maintenance    Goals Addressed             This Visit's Progress    COMPLETED: Care Coordination activities-no follow up required       Care Coordination Interventions: Discussed [facility] services and support. Assessed SDOH. Advised to discuss with primary care physician if services needed in the future.  Care Coordination Interventions: Evaluation of current treatment plan related to hypertension self management and patient's adherence to plan as established by provider Advised patient, providing education and rationale, to monitor blood pressure daily and record, calling PCP for findings outside established parameters           SDOH assessments and interventions completed:  Yes  SDOH Interventions Today    Flowsheet Row Most Recent Value  SDOH Interventions   Food Insecurity Interventions Intervention Not Indicated        Care Coordination Interventions:  Yes, provided   Follow up plan: No further intervention required.   Encounter Outcome:  Pt. Visit Completed   Contrell Ballentine Idelle Jo, RN, MSN [facility]  [facility], [facility] Management Community Coordinator Direct Dial: [phone]  Fax: [phone] Website: [email or web address]

## 2022-12-07 NOTE — Assessment & Plan Note (Signed)
Stable. Continue to focus on blood pressure control.

## 2022-12-07 NOTE — Assessment & Plan Note (Signed)
Continue pravastatin 20 mg daily. 

## 2022-12-07 NOTE — Patient Instructions (Signed)
Visit Information  Thank you for taking time to visit with me today. Please don't hesitate to contact me if I can be of assistance to you.   Following are the goals we discussed today:   Goals Addressed             This Visit's Progress    COMPLETED: Care Coordination activities-no follow up required       Care Coordination Interventions: Discussed [facility] services and support. Assessed SDOH. Advised to discuss with primary care physician if services needed in the future.  Care Coordination Interventions: Evaluation of current treatment plan related to hypertension self management and patient's adherence to plan as established by provider Advised patient, providing education and rationale, to monitor blood pressure daily and record, calling PCP for findings outside established parameters           If you are experiencing a Mental Health or Behavioral Health Crisis or need someone to talk to, please call the Suicide and Crisis Lifeline: 988   The patient verbalized understanding of instructions, educational materials, and care plan provided today and DECLINED offer to receive copy of patient instructions, educational materials, and care plan.   The patient has been provided with contact information for the care management team and has been advised to call with any health related questions or concerns.   Bary Leriche, RN, MSN [facility], [facility] Management Community Coordinator Direct Dial: [phone]  Fax: [phone] Website: [email or web address]

## 2022-12-07 NOTE — Assessment & Plan Note (Signed)
Diet-controlled. We will check A1c today.

## 2022-12-07 NOTE — Assessment & Plan Note (Signed)
BP is elevated today. She did not take her blood pressure pill this morning, noting she does not like to take this on an empty stomach. I will continue losartan 25 mg daily.

## 2022-12-08 DIAGNOSIS — H353134 Nonexudative age-related macular degeneration, bilateral, advanced atrophic with subfoveal involvement: Secondary | ICD-10-CM | POA: Diagnosis not present

## 2022-12-08 DIAGNOSIS — R7303 Prediabetes: Secondary | ICD-10-CM | POA: Diagnosis not present

## 2022-12-08 LAB — HM DIABETES EYE EXAM

## 2022-12-28 ENCOUNTER — Encounter (INDEPENDENT_AMBULATORY_CARE_PROVIDER_SITE_OTHER): Payer: Medicare HMO | Admitting: Ophthalmology

## 2022-12-28 DIAGNOSIS — H35033 Hypertensive retinopathy, bilateral: Secondary | ICD-10-CM

## 2022-12-28 DIAGNOSIS — I10 Essential (primary) hypertension: Secondary | ICD-10-CM

## 2022-12-28 DIAGNOSIS — H43813 Vitreous degeneration, bilateral: Secondary | ICD-10-CM

## 2022-12-28 DIAGNOSIS — H353231 Exudative age-related macular degeneration, bilateral, with active choroidal neovascularization: Secondary | ICD-10-CM | POA: Diagnosis not present

## 2023-01-03 DIAGNOSIS — H1045 Other chronic allergic conjunctivitis: Secondary | ICD-10-CM | POA: Diagnosis not present

## 2023-02-08 ENCOUNTER — Encounter (INDEPENDENT_AMBULATORY_CARE_PROVIDER_SITE_OTHER): Payer: Medicare HMO | Admitting: Ophthalmology

## 2023-02-08 DIAGNOSIS — H353231 Exudative age-related macular degeneration, bilateral, with active choroidal neovascularization: Secondary | ICD-10-CM

## 2023-02-08 DIAGNOSIS — H43813 Vitreous degeneration, bilateral: Secondary | ICD-10-CM | POA: Diagnosis not present

## 2023-02-08 DIAGNOSIS — H35033 Hypertensive retinopathy, bilateral: Secondary | ICD-10-CM | POA: Diagnosis not present

## 2023-02-08 DIAGNOSIS — I10 Essential (primary) hypertension: Secondary | ICD-10-CM | POA: Diagnosis not present

## 2023-03-04 DIAGNOSIS — Z0100 Encounter for examination of eyes and vision without abnormal findings: Secondary | ICD-10-CM | POA: Diagnosis not present

## 2023-03-06 ENCOUNTER — Ambulatory Visit: Payer: Medicare HMO | Admitting: Family Medicine

## 2023-03-12 ENCOUNTER — Encounter: Payer: Self-pay | Admitting: Family Medicine

## 2023-03-12 ENCOUNTER — Ambulatory Visit (INDEPENDENT_AMBULATORY_CARE_PROVIDER_SITE_OTHER): Payer: Medicare HMO | Admitting: Family Medicine

## 2023-03-12 VITALS — BP 128/72 | HR 90 | Temp 98.6°F | Ht 62.0 in | Wt 145.2 lb

## 2023-03-12 DIAGNOSIS — I10 Essential (primary) hypertension: Secondary | ICD-10-CM

## 2023-03-12 DIAGNOSIS — E782 Mixed hyperlipidemia: Secondary | ICD-10-CM | POA: Diagnosis not present

## 2023-03-12 DIAGNOSIS — G4762 Sleep related leg cramps: Secondary | ICD-10-CM | POA: Insufficient documentation

## 2023-03-12 DIAGNOSIS — N1832 Chronic kidney disease, stage 3b: Secondary | ICD-10-CM | POA: Diagnosis not present

## 2023-03-12 DIAGNOSIS — E1122 Type 2 diabetes mellitus with diabetic chronic kidney disease: Secondary | ICD-10-CM | POA: Diagnosis not present

## 2023-03-12 DIAGNOSIS — I129 Hypertensive chronic kidney disease with stage 1 through stage 4 chronic kidney disease, or unspecified chronic kidney disease: Secondary | ICD-10-CM | POA: Diagnosis not present

## 2023-03-12 DIAGNOSIS — R6889 Other general symptoms and signs: Secondary | ICD-10-CM | POA: Diagnosis not present

## 2023-03-12 LAB — HEMOGLOBIN A1C: Hgb A1c MFr Bld: 6.4 % (ref 4.6–6.5)

## 2023-03-12 LAB — GLUCOSE, RANDOM: Glucose, Bld: 97 mg/dL (ref 70–99)

## 2023-03-12 NOTE — Progress Notes (Signed)
[facility] CARE [facility] [address] [place] [address] Dept: [phone] Dept Fax: [phone]  Chronic Care Office Visit  Subjective:    Patient ID: Burklee Bi, female    DOB: 11-Aug-1930, 89 y.o..   MRN: [id number]  Chief Complaint  Patient presents with   Hypertension    3 month f/u HTN/DM.  No concerns.  Fasting today.    History of Present Illness:  Patient is in today for reassessment of chronic medical issues.  Ms. Duval has a history of hypertension. She is currently managed on losartan 25 mg daily. She also has Stage 3b CKD. This has been stable.   Ms. Cink has a history of hyperlipidemia. She is managed on pravastatin 20 mg daily.  Ms. Top notes she recently had some cramping in her feet at night. She got some [employer]als Leg Cramps PM to take. She notes this rapidly resolved the cramping.  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 No pertinent surgical history. Family History  Problem Relation Age of Onset   Heart disease Mother    Stroke Father    Hypertension Father    Early death Brother    Diabetes Brother    Heart disease Brother    Heart disease Brother    Heart disease Paternal Grandmother    Cancer Maternal Aunt        Ovarian   Outpatient Medications Prior to  Visit  Medication Sig Dispense Refill   ketorolac (ACULAR) 0.5 % ophthalmic solution Place into the left eye.     losartan (COZAAR) 25 MG tablet TAKE 1 TABLET EVERY DAY 90 tablet 3   Omega-3 Fatty Acids (FISH OIL) 1000 MG CAPS Take 1,000 mg by mouth 2 (two) times daily.     omeprazole (PRILOSEC) 40 MG capsule TAKE 1 CAPSULE EVERY DAY 90 capsule 3   ondansetron (ZOFRAN-ODT) 4 MG disintegrating tablet DISSOLVE 1 TABLET ON THE TONGUE EVERY 8 (EIGHT) HOURS AS NEEDED FOR NAUSEA OR VOMITING. 5 tablet 0   pravastatin (PRAVACHOL) 20 MG tablet TAKE 1 TABLET EVERY DAY 90 tablet 3   selenium sulfide (SELSUN) 1 % LOTN Apply 1 Application topically 2 (two) times a week. 118 mL 0   trimethoprim-polymyxin b (POLYTRIM) ophthalmic solution INT 1 GTT INTO OS QID 2 DAYS AFTER EACH MONTHLY INJECTION  12   No facility-administered medications prior to visit.   Allergies  Allergen Reactions   Amlodipine Other (See Comments)    Pedal edema   Lisinopril-Hydrochlorothiazide Swelling    Angioedema   Penicillins     Has patient had a PCN reaction causing immediate rash, facial/tongue/throat swelling, SOB or lightheadedness with hypotension: Yes Has patient had a PCN reaction causing severe rash involving mucus membranes or skin necrosis: Unknown Has patient had a PCN reaction that required hospitalization: Unknown Has patient had a PCN reaction occurring within the last 10 years: No If  all of the above answers are "NO", then may proceed with Cephalosporin use.   Objective:   Today's Vitals   03/12/23 0906  BP: 128/72  Pulse: 90  Temp: 98.6 F (37 C)  TempSrc: Temporal  SpO2: 97%  Weight: 145 lb 3.2 oz (65.9 kg)  Height: 5\' 2"  (1.575 m)   Body mass index is 26.56 kg/m.   General: Well developed, well nourished. No acute distress. Feet- Skin intact. No sign of maceration between toes. Nails are normal. Dorsalis pedis and posterior tibial artery   pulses are normal. 5.07 monofilament testing  normal. Psych: Alert and oriented. Normal mood and affect.  Health Maintenance Due  Topic Date Due   DTaP/Tdap/Td (1 - Tdap) Never done   Zoster Vaccines- Shingrix (1 of 2) Never done   Pneumonia Vaccine 40+ Years old (1 of 1 - PCV) Never done   FOOT EXAM  03/10/2023     Assessment & Plan:   Problem List Items Addressed This Visit       Cardiovascular and Mediastinum   Essential hypertension    BP is at goal. Continue losartan 25 mg daily.      Type 2 diabetes mellitus with stage 3b chronic kidney disease and hypertension (HCC) - Primary    Diet-controlled. We will check A1c today.      Relevant Orders   Glucose, random   Hemoglobin A1c     Genitourinary   Chronic kidney disease, stage 3b (HCC)    Stable. Continue focus on blood pressure and glucose control, adequate hydration, and avoidance of nephrotoxic medications.         Other   Hyperlipidemia    Lipids are at goal. Continue pravastatin 20 mg daily.      Nocturnal leg cramps    Review of the Leg Cramps PM she is taking, shows this does contain quinine, which is a potential treatment for leg cramps. I am okay if she continues this.       Return in about 3 months (around 06/10/2023) for Reassessment.   Loyola Mast, MD

## 2023-03-12 NOTE — Assessment & Plan Note (Signed)
BP is at goal. Continue losartan 25 mg daily.

## 2023-03-12 NOTE — Assessment & Plan Note (Signed)
Lipids are at goal. Continue pravastatin 20 mg daily.

## 2023-03-12 NOTE — Assessment & Plan Note (Signed)
Review of the Leg Cramps PM she is taking, shows this does contain quinine, which is a potential treatment for leg cramps. I am okay if she continues this.

## 2023-03-12 NOTE — Assessment & Plan Note (Signed)
Diet-controlled. We will check A1c today.

## 2023-03-12 NOTE — Assessment & Plan Note (Signed)
Stable. Continue focus on blood pressure and glucose control, adequate hydration, and avoidance of nephrotoxic medications.

## 2023-03-22 ENCOUNTER — Encounter (INDEPENDENT_AMBULATORY_CARE_PROVIDER_SITE_OTHER): Payer: Medicare HMO | Admitting: Ophthalmology

## 2023-03-22 DIAGNOSIS — H43813 Vitreous degeneration, bilateral: Secondary | ICD-10-CM

## 2023-03-22 DIAGNOSIS — H353231 Exudative age-related macular degeneration, bilateral, with active choroidal neovascularization: Secondary | ICD-10-CM | POA: Diagnosis not present

## 2023-03-22 DIAGNOSIS — I10 Essential (primary) hypertension: Secondary | ICD-10-CM | POA: Diagnosis not present

## 2023-03-22 DIAGNOSIS — H35033 Hypertensive retinopathy, bilateral: Secondary | ICD-10-CM | POA: Diagnosis not present

## 2023-05-10 ENCOUNTER — Encounter (INDEPENDENT_AMBULATORY_CARE_PROVIDER_SITE_OTHER): Payer: Medicare HMO | Admitting: Ophthalmology

## 2023-05-10 DIAGNOSIS — H353231 Exudative age-related macular degeneration, bilateral, with active choroidal neovascularization: Secondary | ICD-10-CM | POA: Diagnosis not present

## 2023-05-10 DIAGNOSIS — I10 Essential (primary) hypertension: Secondary | ICD-10-CM | POA: Diagnosis not present

## 2023-05-10 DIAGNOSIS — H43813 Vitreous degeneration, bilateral: Secondary | ICD-10-CM

## 2023-05-10 DIAGNOSIS — H35033 Hypertensive retinopathy, bilateral: Secondary | ICD-10-CM | POA: Diagnosis not present

## 2023-05-19 ENCOUNTER — Other Ambulatory Visit: Payer: Self-pay | Admitting: Family Medicine

## 2023-05-19 DIAGNOSIS — I10 Essential (primary) hypertension: Secondary | ICD-10-CM

## 2023-05-19 DIAGNOSIS — K219 Gastro-esophageal reflux disease without esophagitis: Secondary | ICD-10-CM

## 2023-06-05 ENCOUNTER — Ambulatory Visit (INDEPENDENT_AMBULATORY_CARE_PROVIDER_SITE_OTHER): Payer: Medicare HMO | Admitting: Dermatology

## 2023-06-05 ENCOUNTER — Encounter: Payer: Self-pay | Admitting: Dermatology

## 2023-06-05 VITALS — BP 135/66

## 2023-06-05 DIAGNOSIS — L6681 Central centrifugal cicatricial alopecia: Secondary | ICD-10-CM

## 2023-06-05 DIAGNOSIS — L649 Androgenic alopecia, unspecified: Secondary | ICD-10-CM | POA: Diagnosis not present

## 2023-06-05 MED ORDER — SAFETY SEAL MISCELLANEOUS MISC: 1.0000 | Freq: Every morning | 3 refills | Status: DC

## 2023-06-05 NOTE — Progress Notes (Signed)
   New Patient Visit   Subjective  Whitney Davidson is a 89 y.o. female who presents for the following: Hair loss x over a year. Her scalp is itchy. She is using an Ambulance person Therapeutic ointment.  Accompanied by her cousin today.   The following portions of the chart were reviewed this encounter and updated as appropriate: medications, allergies, medical history  Review of Systems:  No other skin or systemic complaints except as noted in HPI or Assessment and Plan.  Objective  Well appearing patient in no apparent distress; mood and affect are within normal limits.   A focused examination was performed of the following areas:   Relevant exam findings are noted in the Assessment and Plan.              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 Reports history of tight hairstyles and relaxer use, along with scalp itching that improved with over-the-counter treatment. 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 Some hair follicles are permanently scarred, but potential for hair regrowth in other areas noted.   Plan:  Prescribe clobetasol for scalp inflammation. Prescribe 8% minoxidil for hair regrowth, to be applied every morning.   Recommend Viviscal supplement for hair growth. Take baseline photographs for comparison at 33-month follow-up.   Prescriptions to be filled at [facility] pharmacy. Schedule follow-up appointment in 6 months to assess treatment efficacy.   Intra-visit was 30 minutes and included updating the relevant history, performing a video or in-person physical exam as appropriate, creating a  treatment plan and used shared decision making with the patient.  Post-visit was 15 minutes that encompassed note completion, placing of orders, updating patient instructions and coordination of care.     Treatment Plan: Continue Designer Therapeutic ointment every night.  Start AA Gel (Minoxidil/Finasteride) every morning - sent to [facility].  Recommend Viviscal supplements daily.         No follow-ups on file.  I, Joanie Coddington, CMA, am acting as scribe for [employer], DO .   Documentation: I have reviewed the above documentation for accuracy and completeness, and I agree with the above.  Langston Reusing, DO

## 2023-06-05 NOTE — Patient Instructions (Addendum)
 Dear Whitney Davidson,  Thank you for visiting [place] today.   Below is a summary of our discussion and the outlined treatment plan aimed at improving your condition:  Continue using the current scalp cream that has been effective in reducing scalp itching.  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   Minoxidil 8%- Finasteride: Apply every morning to the affected areas to aid in hair regrowth. Please be cautious to avoid application on the face.  Supplement Recommendation: Consider using Viviscal supplements, which are available at [employer], [employer], or online, to support hair growth.  Follow-Up: Today, we have taken pictures for comparison in six months to monitor your progress.  Pharmacy Details: Your prescriptions for minoxidil and clobetasol will be processed at [facility] pharmacy. They will contact you for address verification and payment, which is approximately $45.  MyChart: For any questions or to communicate with [place], please use MyChart. Assistance with resetting your link is available at the front desk.  We are looking forward to seeing the progress at your follow-up visit. Please do not hesitate to reach out if you have any questions or concerns before then.  Warm regards,  Dr. Langston Reusing [facility]at[facility]               Your provider has sent your prescription to [facility] in [place], [place]. A pharmacy representative will call you to confirm details and take your payment information. If you do not receive a call within 24 hours, please contact the pharmacy at [phone] or [phone]. Your unique skincare compound is being formulated in our lab (most compounds take less than 24 hours). Your prescription is shipped vis USPS to your mailbox (2-4 business days). Priority shipping is available at an additional cost. Once  received, you will electronically sign/acknowledge that you received your prescription. The pharmacy hours are Monday-Friday 9 am-6 pm EST and Saturday 9 am-1 pm EST.      Important Information  Due to recent changes in healthcare laws, you may see results of your pathology and/or laboratory studies on MyChart before the doctors have had a chance to review them. We understand that in some cases there may be results that are confusing or concerning to you. Please understand that not all results are received at the same time and often the doctors may need to interpret multiple results in order to provide you with the best plan of care or course of treatment. Therefore, we ask that you please give [place] 2 business days to thoroughly review all your results before contacting the office for clarification. Should we see a critical lab result, you will be contacted sooner.   If You Need Anything After Your Visit  If you have any questions or concerns for your doctor, please call our main line at [phone] If no one answers, please leave a voicemail as directed and we will return your call as soon as possible. Messages left after 4 pm will be answered the following business day.   You may also send [place] a message via MyChart. We typically respond to MyChart messages within 1-2 business days.  For prescription refills, please ask your pharmacy to contact our office. Our fax number is [phone].  If you have an urgent issue when the clinic is closed that cannot wait until the next business day, you can page your doctor at the number below.  Please note that while we do our best to be available for urgent issues outside of office hours, we are not available 24/7.   If you have an urgent issue and are unable to reach [place], you may choose to seek medical care at your doctor's office, retail clinic, urgent care center, or emergency room.  If you have a medical emergency, please immediately call 911 or go  to the emergency department. In the event of inclement weather, please call our main line at [phone] for an update on the status of any delays or closures.  Dermatology Medication Tips: Please keep the boxes that topical medications come in in order to help keep track of the instructions about where and how to use these. Pharmacies typically print the medication instructions only on the boxes and not directly on the medication tubes.   If your medication is too expensive, please contact our office at [phone] or send [place] a message through MyChart.   We are unable to tell what your co-pay for medications will be in advance as this is different depending on your insurance coverage. However, we may be able to find a substitute medication at lower cost or fill out paperwork to get insurance to cover a needed medication.   If a prior authorization is required to get your medication covered by your insurance company, please allow [place] 1-2 business days to complete this process.  Drug prices often vary depending on where the prescription is filled and some pharmacies may offer cheaper prices.  The website www.goodrx.com contains coupons for medications through different pharmacies. The prices here do not account for what the cost may be with help from insurance (it may be cheaper with your insurance), but the website can give you the price if you did not use any insurance.  - You can print the associated coupon and take it with your prescription to the pharmacy.  - You may also stop by our office during regular business hours and pick up a GoodRx coupon card.  - If you need your prescription sent electronically to a different pharmacy, notify our office through [facility] or by phone at [phone]

## 2023-06-12 ENCOUNTER — Ambulatory Visit: Payer: Medicare HMO | Admitting: Family Medicine

## 2023-06-12 ENCOUNTER — Encounter: Payer: Self-pay | Admitting: Family Medicine

## 2023-06-12 VITALS — BP 130/72 | HR 78 | Temp 97.9°F | Ht 62.0 in | Wt 146.2 lb

## 2023-06-12 DIAGNOSIS — L6681 Central centrifugal cicatricial alopecia: Secondary | ICD-10-CM

## 2023-06-12 DIAGNOSIS — M25512 Pain in left shoulder: Secondary | ICD-10-CM

## 2023-06-12 DIAGNOSIS — I129 Hypertensive chronic kidney disease with stage 1 through stage 4 chronic kidney disease, or unspecified chronic kidney disease: Secondary | ICD-10-CM

## 2023-06-12 DIAGNOSIS — E1122 Type 2 diabetes mellitus with diabetic chronic kidney disease: Secondary | ICD-10-CM

## 2023-06-12 DIAGNOSIS — N1832 Chronic kidney disease, stage 3b: Secondary | ICD-10-CM

## 2023-06-12 DIAGNOSIS — E782 Mixed hyperlipidemia: Secondary | ICD-10-CM

## 2023-06-12 DIAGNOSIS — I10 Essential (primary) hypertension: Secondary | ICD-10-CM | POA: Diagnosis not present

## 2023-06-12 DIAGNOSIS — L649 Androgenic alopecia, unspecified: Secondary | ICD-10-CM | POA: Insufficient documentation

## 2023-06-12 LAB — HEMOGLOBIN A1C: Hgb A1c MFr Bld: 6.4 % (ref 4.6–6.5)

## 2023-06-12 LAB — GLUCOSE, RANDOM: Glucose, Bld: 99 mg/dL (ref 70–99)

## 2023-06-12 NOTE — Assessment & Plan Note (Signed)
 Dr. Onalee Hua has prescribed topical clobetasol and minoxidil for daily use.

## 2023-06-12 NOTE — Assessment & Plan Note (Signed)
 BP is at goal. Continue losartan 25 mg daily.

## 2023-06-12 NOTE — Assessment & Plan Note (Signed)
Stable. Continue focus on blood pressure and glucose control, adequate hydration, and avoidance of nephrotoxic medications.

## 2023-06-12 NOTE — Progress Notes (Signed)
 [facility] [address] [place] [address] Dept: [phone] Dept Fax: [phone]  Chronic Care Office Visit  Subjective:    Patient ID: Whitney Davidson, female    DOB: 08/24/1930, 89 y.o..   MRN: [id number]  Chief Complaint  Patient presents with   Diabetes    3 month f/u DM/HTN.  C/o having LT shoulder/neck pain x 2 weeks.     History of Present Illness:  Patient is in today for reassessment of chronic medical issues.  Whitney Davidson has a history of hypertension. She is currently managed on losartan 25 mg daily. She also has Stage 3b CKD. This has been stable.   Whitney Davidson has a history of hyperlipidemia. She is managed on pravastatin 20 mg daily.   Whitney Davidson has a history of Type 2 diabetes.  This is currently diet-controlled.   Whitney Davidson has had progressive hair loss, itching scalp and dandruff. This has been gradually progressive over years. She recently saw Dr. Onalee Hua (dermatology) who confirmed central cicatricial centrifugal alopecia (CCCA), but feels there is also an aspect of female pattern hair loss). She has prescribed a topical clobetasol and minoxidil treatment.  Whitney Davidson has been noting some mild shoulder discomfort. She notes this in the area of her left shoulder blade, but also coming into her left neck and left upper chest. She feels this may relate to carrying heavy ledgers.   Past Medical History: Patient Active Problem List   Diagnosis Date Noted   Nocturnal leg cramps 03/12/2023   Cicatricial alopecia 09/07/2022   Allergic conjunctivitis of both eyes 06/08/2022   Meralgia paraesthetica, right 06/08/2022   Arthritis of left hip 11/15/2020   Type 2 diabetes mellitus with stage 3b chronic kidney disease and hypertension (HCC) 05/14/2019   Osteoporosis 07/19/2017   GERD (gastroesophageal reflux disease), on Omeprazole 10/15/2016   Macular degeneration 10/15/2016   Essential hypertension 09/01/2016    Hyperlipidemia 09/01/2016   LBBB (left bundle branch block) 09/01/2016   Chronic kidney disease, stage 3b (HCC) 09/01/2016   History reviewed. No pertinent surgical history. Family History  Problem Relation Age of Onset   Heart disease Mother    Stroke Father    Hypertension Father    Early death Brother    Diabetes Brother    Heart disease Brother    Heart disease Brother    Heart disease Paternal Grandmother    Cancer Maternal Aunt        Ovarian   Outpatient Medications Prior to Visit  Medication Sig Dispense Refill   ketorolac (ACULAR) 0.5 % ophthalmic solution Place into the left eye.     losartan (COZAAR) 25 MG tablet TAKE 1 TABLET EVERY DAY 90 tablet 1   Omega-3 Fatty Acids (FISH OIL) 1000 MG CAPS Take 1,000 mg by mouth 2 (two) times daily.     omeprazole (PRILOSEC) 40 MG capsule TAKE 1 CAPSULE EVERY DAY 90 capsule 1   ondansetron (ZOFRAN-ODT) 4 MG disintegrating tablet DISSOLVE 1 TABLET ON THE TONGUE EVERY 8 (EIGHT) HOURS AS NEEDED FOR NAUSEA OR VOMITING. 5 tablet 0   pravastatin (PRAVACHOL) 20 MG tablet TAKE 1 TABLET EVERY DAY 90 tablet 1   Safety Seal Miscellaneous MISC Apply 1 application  topically in the morning. Medication Name: AA Gel with 8% Minoxidil 30 g 3   selenium sulfide (SELSUN) 1 % LOTN Apply 1 Application topically 2 (two) times a week. 118 mL 0   trimethoprim-polymyxin b (POLYTRIM) ophthalmic solution INT 1  GTT INTO OS QID 2 DAYS AFTER EACH MONTHLY INJECTION  12   No facility-administered medications prior to visit.   Allergies  Allergen Reactions   Amlodipine Other (See Comments)    Pedal edema   Lisinopril-Hydrochlorothiazide Swelling    Angioedema   Penicillins     Has patient had a PCN reaction causing immediate rash, facial/tongue/throat swelling, SOB or lightheadedness with hypotension: Yes Has patient had a PCN reaction causing severe rash involving mucus membranes or skin necrosis: Unknown Has patient had a PCN reaction that required  hospitalization: Unknown Has patient had a PCN reaction occurring within the last 10 years: No If all of the above answers are "NO", then may proceed with Cephalosporin use.   Objective:   Today's Vitals   06/12/23 0926  BP: 130/72  Pulse: 78  Temp: 97.9 F (36.6 C)  TempSrc: Temporal  SpO2: 99%  Weight: 146 lb 3.2 oz (66.3 kg)  Height: 5\' 2"  (1.575 m)   Body mass index is 26.74 kg/m.   General: Well developed, well nourished. No acute distress. CV: RRR without murmurs or rubs. Pulses 2+ bilaterally. Extremities: Good ROM of left shoulder. No joint swelling or tenderness. There is some muscular knotting   overlying the left scapula.  Psych: Alert and oriented. Normal mood and affect.  Health Maintenance Due  Topic Date Due   Pneumonia Vaccine 18+ Years old (1 of 2 - PCV) Never done   DTaP/Tdap/Td (1 - Tdap) Never done   Zoster Vaccines- Shingrix (1 of 2) Never done     Assessment & Plan:   Problem List Items Addressed This Visit       Cardiovascular and Mediastinum   Essential hypertension   BP is at goal. Continue losartan 25 mg daily.      Type 2 diabetes mellitus with stage 3b chronic kidney disease and hypertension (HCC) - Primary   Diet-controlled. We will check A1c today.      Relevant Orders   Glucose, random   Hemoglobin A1c     Musculoskeletal and Integument   Central centrifugal cicatricial alopecia   Dr. Onalee Hua has prescribed topical clobetasol and minoxidil for daily use.        Genitourinary   Chronic kidney disease, stage 3b (HCC)   Stable. Continue focus on blood pressure and glucose control, adequate hydration, and avoidance of nephrotoxic medications.         Other   Hyperlipidemia   Lipids are at goal. Continue pravastatin 20 mg daily.      Other Visit Diagnoses       Acute pain of left shoulder       Appears to be a muscular issue. Recommend Whitney Davidson use heat and stretches with PRN Tylenol to address pain. Avoid lifting heavy  ledgers.       Return in about 3 months (around 09/12/2023) for Reassessment.   Loyola Mast, MD

## 2023-06-12 NOTE — Assessment & Plan Note (Signed)
Diet-controlled. We will check A1c today.

## 2023-06-12 NOTE — Assessment & Plan Note (Signed)
 Lipids are at goal. Continue pravastatin 20 mg daily.

## 2023-06-25 ENCOUNTER — Telehealth (INDEPENDENT_AMBULATORY_CARE_PROVIDER_SITE_OTHER): Payer: Self-pay | Admitting: Otolaryngology

## 2023-06-25 NOTE — Telephone Encounter (Signed)
 Confirmed appt & location [id number] [facility]

## 2023-06-26 ENCOUNTER — Telehealth (INDEPENDENT_AMBULATORY_CARE_PROVIDER_SITE_OTHER): Payer: Self-pay | Admitting: Otolaryngology

## 2023-06-26 ENCOUNTER — Encounter (INDEPENDENT_AMBULATORY_CARE_PROVIDER_SITE_OTHER): Payer: Self-pay

## 2023-06-26 ENCOUNTER — Ambulatory Visit (INDEPENDENT_AMBULATORY_CARE_PROVIDER_SITE_OTHER): Payer: Medicare HMO

## 2023-06-26 VITALS — BP 178/68 | HR 93 | Ht 62.0 in | Wt 146.0 lb

## 2023-06-26 DIAGNOSIS — H6123 Impacted cerumen, bilateral: Secondary | ICD-10-CM

## 2023-06-26 NOTE — Telephone Encounter (Signed)
 pt came to church, called [facility] and they said pt could still come today, pt went off the paper work she was given at her last visit

## 2023-06-27 DIAGNOSIS — H6123 Impacted cerumen, bilateral: Secondary | ICD-10-CM | POA: Insufficient documentation

## 2023-06-27 NOTE — Progress Notes (Signed)
 Patient ID: Whitney Davidson, female   DOB: [birth year], 89 y.o.   MRN: [id number]  Follow-up: Recurrent cerumen impaction  Procedure: Bilateral cerumen disimpaction.   Indication: Cerumen impaction, resulting in ear discomfort and conductive hearing loss.   Description: The patient is placed supine on the operating table. Under the operating microscope, the right ear canal is examined and is noted to be impacted with cerumen. The cerumen is carefully removed with a combination of suction catheters, cerumen curette, and alligator forceps. After the cerumen removal, the ear canal and tympanic membrane are noted to be normal. No middle ear effusion is noted. The same procedure is then repeated on the left side without exception. The patient tolerated the procedure well.  Follow-up care:  The patient is instructed not to use Q-tips to clean the ear canals. The patient will follow up as needed.

## 2023-07-05 ENCOUNTER — Encounter (INDEPENDENT_AMBULATORY_CARE_PROVIDER_SITE_OTHER): Payer: Medicare HMO | Admitting: Ophthalmology

## 2023-07-05 DIAGNOSIS — H353231 Exudative age-related macular degeneration, bilateral, with active choroidal neovascularization: Secondary | ICD-10-CM | POA: Diagnosis not present

## 2023-07-05 DIAGNOSIS — I10 Essential (primary) hypertension: Secondary | ICD-10-CM | POA: Diagnosis not present

## 2023-07-05 DIAGNOSIS — H43813 Vitreous degeneration, bilateral: Secondary | ICD-10-CM | POA: Diagnosis not present

## 2023-07-05 DIAGNOSIS — H35033 Hypertensive retinopathy, bilateral: Secondary | ICD-10-CM | POA: Diagnosis not present

## 2023-08-28 ENCOUNTER — Encounter (INDEPENDENT_AMBULATORY_CARE_PROVIDER_SITE_OTHER): Admitting: Ophthalmology

## 2023-08-28 DIAGNOSIS — H43813 Vitreous degeneration, bilateral: Secondary | ICD-10-CM | POA: Diagnosis not present

## 2023-08-28 DIAGNOSIS — H35033 Hypertensive retinopathy, bilateral: Secondary | ICD-10-CM

## 2023-08-28 DIAGNOSIS — H353231 Exudative age-related macular degeneration, bilateral, with active choroidal neovascularization: Secondary | ICD-10-CM | POA: Diagnosis not present

## 2023-08-28 DIAGNOSIS — I10 Essential (primary) hypertension: Secondary | ICD-10-CM

## 2023-09-12 ENCOUNTER — Encounter: Payer: Self-pay | Admitting: Family Medicine

## 2023-09-12 ENCOUNTER — Ambulatory Visit: Admitting: Family Medicine

## 2023-09-12 VITALS — BP 126/68 | HR 78 | Temp 97.5°F | Ht 62.0 in | Wt 143.0 lb

## 2023-09-12 DIAGNOSIS — E782 Mixed hyperlipidemia: Secondary | ICD-10-CM | POA: Diagnosis not present

## 2023-09-12 DIAGNOSIS — I10 Essential (primary) hypertension: Secondary | ICD-10-CM | POA: Diagnosis not present

## 2023-09-12 DIAGNOSIS — R350 Frequency of micturition: Secondary | ICD-10-CM

## 2023-09-12 DIAGNOSIS — E1122 Type 2 diabetes mellitus with diabetic chronic kidney disease: Secondary | ICD-10-CM

## 2023-09-12 DIAGNOSIS — N1832 Chronic kidney disease, stage 3b: Secondary | ICD-10-CM | POA: Diagnosis not present

## 2023-09-12 DIAGNOSIS — I129 Hypertensive chronic kidney disease with stage 1 through stage 4 chronic kidney disease, or unspecified chronic kidney disease: Secondary | ICD-10-CM

## 2023-09-12 DIAGNOSIS — L6681 Central centrifugal cicatricial alopecia: Secondary | ICD-10-CM | POA: Diagnosis not present

## 2023-09-12 NOTE — Assessment & Plan Note (Signed)
 Diet-controlled. A1c has been in excellent control.

## 2023-09-12 NOTE — Assessment & Plan Note (Signed)
 Improved with clobetasol and minoxidil daily use.

## 2023-09-12 NOTE — Progress Notes (Signed)
 [facility] [address] [place] [address] Dept: [phone] Dept Fax: [phone]  Chronic Care Office Visit  Subjective:    Patient ID: Whitney Davidson, female    DOB: 1930/09/15, 89 y.o..   MRN: [id number]  Chief Complaint  Patient presents with   Diabetes    3 month f/u.  No concerns.    History of Present Illness:  Patient is in today for reassessment of chronic medical issues.  Whitney Davidson has a history of hypertension. She is currently managed on losartan  25 mg daily. She also has Stage 3b CKD. This has been stable.   Whitney Davidson has a history of hyperlipidemia. She is managed on pravastatin  20 mg daily.   Whitney Davidson has a history of Type 2 diabetes.  This is currently diet-controlled.    Whitney Davidson has central cicatricial centrifugal alopecia (CCCA), but feels there is also an aspect of female pattern hair loss). She has prescribed a topical clobetasol and minoxidil treatment. She notes the dandruff and itching issues have resovled and feels she may be having some hair regrowth.   Whitney Davidson has continue mild left shoulder discomfort. She is trying to do exercises to help with this.   Past Medical History: Patient Active Problem List   Diagnosis Date Noted   Impacted cerumen of both ears 06/27/2023   Androgenetic alopecia 06/12/2023   Nocturnal leg cramps 03/12/2023   Central centrifugal cicatricial alopecia 09/07/2022   Allergic conjunctivitis of both eyes 06/08/2022   Meralgia paraesthetica, right 06/08/2022   Arthritis of left hip 11/15/2020   Type 2 diabetes mellitus with stage 3b chronic kidney disease and hypertension (HCC) 05/14/2019   Osteoporosis 07/19/2017   GERD (gastroesophageal reflux disease), on Omeprazole  10/15/2016   Macular degeneration 10/15/2016   Essential hypertension 09/01/2016   Hyperlipidemia 09/01/2016   LBBB (left bundle branch block) 09/01/2016   Chronic kidney disease, stage 3b (HCC)  09/01/2016   History reviewed. No pertinent surgical history. Family History  Problem Relation Age of Onset   Heart disease Mother    Stroke Father    Hypertension Father    Early death Brother    Diabetes Brother    Heart disease Brother    Heart disease Brother    Heart disease Paternal Grandmother    Cancer Maternal Aunt        Ovarian   Outpatient Medications Prior to Visit  Medication Sig Dispense Refill   clobetasol cream (TEMOVATE) 0.05 % Apply 1 Application topically 2 (two) times daily.     ketorolac (ACULAR) 0.5 % ophthalmic solution Place into the left eye.     losartan  (COZAAR ) 25 MG tablet TAKE 1 TABLET EVERY DAY 90 tablet 1   Omega-3 Fatty Acids (FISH OIL) 1000 MG CAPS Take 1,000 mg by mouth 2 (two) times daily.     omeprazole  (PRILOSEC) 40 MG capsule TAKE 1 CAPSULE EVERY DAY 90 capsule 1   pravastatin  (PRAVACHOL ) 20 MG tablet TAKE 1 TABLET EVERY DAY 90 tablet 1   Safety Seal Miscellaneous MISC Apply 1 application  topically in the morning. Medication Name: AA Gel with 8% Minoxidil 30 g 3   selenium  sulfide (SELSUN ) 1 % LOTN Apply 1 Application topically 2 (two) times a week. 118 mL 0   trimethoprim-polymyxin b (POLYTRIM) ophthalmic solution INT 1 GTT INTO OS QID 2 DAYS AFTER EACH MONTHLY INJECTION  12   ondansetron  (ZOFRAN -ODT) 4 MG disintegrating tablet DISSOLVE 1 TABLET ON THE TONGUE EVERY 8 (  EIGHT) HOURS AS NEEDED FOR NAUSEA OR VOMITING. 5 tablet 0   No facility-administered medications prior to visit.   Allergies  Allergen Reactions   Amlodipine  Other (See Comments)    Pedal edema   Lisinopril -Hydrochlorothiazide  Swelling    Angioedema   Penicillins     Has patient had a PCN reaction causing immediate rash, facial/tongue/throat swelling, SOB or lightheadedness with hypotension: Yes Has patient had a PCN reaction causing severe rash involving mucus membranes or skin necrosis: Unknown Has patient had a PCN reaction that required hospitalization: Unknown Has  patient had a PCN reaction occurring within the last 10 years: No If all of the above answers are "NO", then may proceed with Cephalosporin use.   Objective:   Today's Vitals   09/12/23 1008  BP: 126/68  Pulse: 78  Temp: (!) 97.5 F (36.4 C)  TempSrc: Temporal  SpO2: 99%  Weight: 143 lb (64.9 kg)  Height: 5\' 2"  (1.575 m)   Body mass index is 26.16 kg/m.   General: Well developed, well nourished. No acute distress. Extremities: Full ROM of left shoulder. No joint swelling or tenderness. Strength 5/5. Empty can test is normal.  Psych: Alert and oriented. Normal mood and affect.  Health Maintenance Due  Topic Date Due   DTaP/Tdap/Td (1 - Tdap) Never done   Pneumonia Vaccine 35+ Years old (1 of 2 - PCV) Never done   Zoster Vaccines- Shingrix (1 of 2) Never done   Medicare Annual Wellness (AWV)  10/15/2023     Assessment & Plan:   Problem List Items Addressed This Visit       Cardiovascular and Mediastinum   Essential hypertension   BP is at goal. Continue losartan  25 mg daily.      Type 2 diabetes mellitus with stage 3b chronic kidney disease and hypertension (HCC) - Primary   Diet-controlled. A1c has been in excellent control.        Musculoskeletal and Integument   Central centrifugal cicatricial alopecia   Improved with clobetasol and minoxidil daily use.        Genitourinary   Chronic kidney disease, stage 3b (HCC)   Stable. Continue focus on blood pressure and glucose control, adequate hydration, and avoidance of nephrotoxic medications.         Other   Hyperlipidemia   Lipids are at goal. Continue pravastatin  20 mg daily.      Other Visit Diagnoses       Urinary frequency       Relevant Orders   Urinalysis w microscopic + reflex cultur       Return in about 3 months (around 12/13/2023) for Reassessment.   Graig Lawyer, MD

## 2023-09-12 NOTE — Assessment & Plan Note (Signed)
 BP is at goal. Continue losartan 25 mg daily.

## 2023-09-12 NOTE — Assessment & Plan Note (Signed)
 Lipids are at goal. Continue pravastatin 20 mg daily.

## 2023-09-12 NOTE — Assessment & Plan Note (Signed)
Stable. Continue focus on blood pressure and glucose control, adequate hydration, and avoidance of nephrotoxic medications.

## 2023-09-13 ENCOUNTER — Ambulatory Visit: Payer: Self-pay | Admitting: Family Medicine

## 2023-09-14 LAB — URINALYSIS W MICROSCOPIC + REFLEX CULTURE
Bacteria, UA: NONE SEEN /HPF
Bilirubin Urine: NEGATIVE
Glucose, UA: NEGATIVE
Hgb urine dipstick: NEGATIVE
Hyaline Cast: NONE SEEN /LPF
Ketones, ur: NEGATIVE
Nitrites, Initial: NEGATIVE
Protein, ur: NEGATIVE
RBC / HPF: NONE SEEN /HPF (ref 0–2)
Specific Gravity, Urine: 1.013 (ref 1.001–1.035)
Squamous Epithelial / HPF: NONE SEEN /HPF (ref <=5)
WBC, UA: NONE SEEN /HPF (ref 0–5)
pH: 7 (ref 5.0–8.0)

## 2023-09-14 LAB — URINE CULTURE
MICRO NUMBER:: 16545711
SPECIMEN QUALITY:: ADEQUATE

## 2023-09-14 LAB — CULTURE INDICATED

## 2023-10-13 ENCOUNTER — Other Ambulatory Visit: Payer: Self-pay | Admitting: Family Medicine

## 2023-10-13 DIAGNOSIS — I10 Essential (primary) hypertension: Secondary | ICD-10-CM

## 2023-10-13 DIAGNOSIS — K219 Gastro-esophageal reflux disease without esophagitis: Secondary | ICD-10-CM

## 2023-10-18 ENCOUNTER — Ambulatory Visit (INDEPENDENT_AMBULATORY_CARE_PROVIDER_SITE_OTHER): Payer: Medicare HMO

## 2023-10-18 DIAGNOSIS — Z5982 Transportation insecurity: Secondary | ICD-10-CM

## 2023-10-18 DIAGNOSIS — Z0000 Encounter for general adult medical examination without abnormal findings: Secondary | ICD-10-CM | POA: Diagnosis not present

## 2023-10-18 NOTE — Progress Notes (Signed)
 Subjective:   Whitney Davidson is a 89 y.o. who presents for a Medicare Wellness preventive visit.  As a reminder, Annual Wellness Visits don't include a physical exam, and some assessments may be limited, especially if this visit is performed virtually. We may recommend an in-person follow-up visit with your provider if needed.  Visit Complete: Virtual I connected with  Whitney Davidson on 10/18/23 by a audio enabled telemedicine application and verified that I am speaking with the correct person using two identifiers.  Patient Location: Home  Provider Location: Office/Clinic  I discussed the limitations of evaluation and management by telemedicine. The patient expressed understanding and agreed to proceed.  Vital Signs: Because this visit was a virtual/telehealth visit, some criteria may be missing or patient reported. Any vitals not documented were not able to be obtained and vitals that have been documented are patient reported.  VideoError- Librarian, academic were attempted between this provider and patient, however failed, due to patient having technical difficulties OR patient did not have access to video capability.  We continued and completed visit with audio only.   Persons Participating in Visit: Patient.  AWV Questionnaire: No: Patient Medicare AWV questionnaire was not completed prior to this visit.  Cardiac Risk Factors include: advanced age (>16men, >39 women);diabetes mellitus;dyslipidemia;hypertension     Objective:    Today's Vitals   There is no height or weight on file to calculate BMI.     10/18/2023    3:12 PM 10/15/2022    3:08 PM 01/16/2022    3:01 PM 12/13/2020    8:44 AM 08/07/2019   12:06 PM 10/29/2017   11:22 AM 09/02/2016    5:03 AM  Advanced Directives  Does Patient Have a Medical Advance Directive? Yes Yes Yes Yes Yes Yes  No   Type of Estate agent of [place];Living will Healthcare Power of [place];Living will  Healthcare Power of [place];Living will Healthcare Power of [employer] Power of [employer] Power of [place];Living will   Does patient want to make changes to medical advance directive?     No - Patient declined No - Patient declined    Copy of Healthcare Power of Attorney in Chart? No - copy requested No - copy requested No - copy requested No - copy requested No - copy requested No - copy requested    Would patient like information on creating a medical advance directive?       No - Patient declined      Data saved with a previous flowsheet row definition    Current Medications (verified) Outpatient Encounter Medications as of 10/18/2023  Medication Sig   clobetasol cream (TEMOVATE) 0.05 % Apply 1 Application topically 2 (two) times daily.   ketorolac (ACULAR) 0.5 % ophthalmic solution Place into the left eye.   losartan  (COZAAR ) 25 MG tablet TAKE 1 TABLET EVERY DAY   Omega-3 Fatty Acids (FISH OIL) 1000 MG CAPS Take 1,000 mg by mouth 2 (two) times daily.   omeprazole  (PRILOSEC) 40 MG capsule TAKE 1 CAPSULE EVERY DAY   pravastatin  (PRAVACHOL ) 20 MG tablet TAKE 1 TABLET EVERY DAY   Safety Seal Miscellaneous MISC Apply 1 application  topically in the morning. Medication Name: AA Gel with 8% Minoxidil   selenium  sulfide (SELSUN ) 1 % LOTN Apply 1 Application topically 2 (two) times a week.   trimethoprim-polymyxin b (POLYTRIM) ophthalmic solution INT 1 GTT INTO OS QID 2 DAYS AFTER EACH MONTHLY INJECTION   No facility-administered encounter medications  on file as of 10/18/2023.    Allergies (verified) Amlodipine , Lisinopril -hydrochlorothiazide , and Penicillins   History: Past Medical History:  Diagnosis Date   Anemia 10/15/2016   CKD (chronic kidney disease) stage 3, GFR 30-59 ml/min (HCC) 09/01/2016   GERD (gastroesophageal reflux disease) 10/15/2016   High cholesterol    Hypertension    LBBB (left bundle branch block) 09/01/2016   Macular degeneration 10/15/2016    Syncope 09/01/2016   Due to dehydration. See ER visit on 09/01/2016.   Vitamin D  deficiency 10/15/2016   History reviewed. No pertinent surgical history. Family History  Problem Relation Age of Onset   Heart disease Mother    Stroke Father    Hypertension Father    Early death Brother    Diabetes Brother    Heart disease Brother    Heart disease Brother    Heart disease Paternal Grandmother    Cancer Maternal Aunt        Ovarian   Social History   Socioeconomic History   Marital status: Widowed    Spouse name: Not on file   Number of children: 0   Years of education: Not on file   Highest education level: Not on file  Occupational History   Occupation: Retired     Comment: Optometrist, Merchandiser, retail  Tobacco Use   Smoking status: Never   Smokeless tobacco: Never  Vaping Use   Vaping status: Never Used  Substance and Sexual Activity   Alcohol use: No   Drug use: No   Sexual activity: Not Currently    Birth control/protection: Post-menopausal  Other Topics Concern   Not on file  Social History Narrative   Lives alone   Doesn't drive    Niece lives nearby and assists and transports    Social Drivers of Health   Financial Resource Strain: Low Risk  (10/18/2023)   Overall Financial Resource Strain (CARDIA)    Difficulty of Paying Living Expenses: Not hard at all  Food Insecurity: No Food Insecurity (10/18/2023)   Hunger Vital Sign    Worried About Running Out of Food in the Last Year: Never true    Ran Out of Food in the Last Year: Never true  Transportation Needs: No Transportation Needs (10/18/2023)   PRAPARE - Administrator, Civil Service (Medical): No    Lack of Transportation (Non-Medical): No  Physical Activity: Sufficiently Active (10/18/2023)   Exercise Vital Sign    Days of Exercise per Week: 3 days    Minutes of Exercise per Session: 60 min  Stress: No Stress Concern Present (10/18/2023)   [employer] of  Occupational Health - Occupational Stress Questionnaire    Feeling of Stress: Not at all  Social Connections: Moderately Integrated (10/18/2023)   Social Connection and Isolation Panel    Frequency of Communication with Friends and Family: More than three times a week    Frequency of Social Gatherings with Friends and Family: More than three times a week    Attends Religious Services: More than 4 times per year    Active Member of [employer] or Organizations: Yes    Attends Banker Meetings: More than 4 times per year    Marital Status: Widowed    Tobacco Counseling Counseling given: Not Answered    Clinical Intake:  Pre-visit preparation completed: Yes  Pain : No/denies pain     Nutritional Risks: None Diabetes: Yes CBG done?: No Did pt. bring in CBG monitor from home?: No  Lab Results  Component Value Date   HGBA1C 6.4 06/12/2023   HGBA1C 6.4 03/12/2023   HGBA1C 6.3 12/07/2022     How often do you need to have someone help you when you read instructions, pamphlets, or other written materials from your doctor or pharmacy?: 1 - Never  Interpreter Needed?: No  Information entered by :: NAllen LPN   Activities of Daily Living     10/18/2023    2:57 PM  In your present state of health, do you have any difficulty performing the following activities:  Hearing? 1  Comment has hearing aids  Vision? 1  Comment macular degeneration in left eye  Difficulty concentrating or making decisions? 0  Walking or climbing stairs? 0  Dressing or bathing? 0  Doing errands, shopping? 0  Preparing Food and eating ? N  Using the Toilet? N  In the past six months, have you accidently leaked urine? N  Do you have problems with loss of bowel control? N  Managing your Medications? N  Managing your Finances? N  Housekeeping or managing your Housekeeping? N    Patient Care Team: Thedora Garnette HERO, MD as PCP - General (Family Medicine) Alvia Norleen BIRCH, MD as Consulting  Physician (Ophthalmology) Alm Delon SAILOR, DO as Consulting Physician (Dermatology)  I have updated your Care Teams any recent Medical Services you may have received from other providers in the past year.     Assessment:   This is a routine wellness examination for Whitney Davidson.  Hearing/Vision screen Hearing Screening - Comments:: Has hearing aids that are maintained Vision Screening - Comments:: Regular eye exams, Dr. Alvia   Goals Addressed             This Visit's Progress    Patient Stated       10/18/2023, stay healthy       Depression Screen     10/18/2023    3:18 PM 09/12/2023   10:19 AM 10/15/2022    3:10 PM 09/07/2022    8:54 AM 01/16/2022    2:59 PM 05/31/2021    8:02 AM 12/13/2020    8:53 AM  PHQ 2/9 Scores  PHQ - 2 Score 0 0 0 0 0 0 0  PHQ- 9 Score 0  1        Fall Risk     10/18/2023    3:17 PM 09/12/2023   10:19 AM 10/15/2022    3:09 PM 09/07/2022    8:54 AM 01/16/2022    2:57 PM  Fall Risk   Falls in the past year? 0 0 0 0 0  Number falls in past yr: 0 0 0 0 0  Injury with Fall? 0 0 0 0 0  Risk for fall due to : Medication side effect No Fall Risks Medication side effect No Fall Risks No Fall Risks  Follow up Falls evaluation completed;Falls prevention discussed Falls evaluation completed Falls prevention discussed;Falls evaluation completed Falls evaluation completed Falls prevention discussed      Data saved with a previous flowsheet row definition    MEDICARE RISK AT HOME:  Medicare Risk at Home Any stairs in or around the home?: No If so, are there any without handrails?: No Home free of loose throw rugs in walkways, pet beds, electrical cords, etc?: Yes Adequate lighting in your home to reduce risk of falls?: Yes Life alert?: No Use of a cane, walker or w/c?: No Grab bars in the bathroom?: No Shower chair or bench in shower?: No  Elevated toilet seat or a handicapped toilet?: Yes  TIMED UP AND GO:  Was the test performed?  No  Cognitive  Function: 6CIT completed        10/18/2023    3:18 PM 10/15/2022    3:11 PM 01/16/2022    3:02 PM 08/07/2019   12:07 PM 10/29/2017   11:33 AM  6CIT Screen  What Year? 0 points 0 points 0 points 0 points 0 points  What month? 0 points 0 points 0 points 0 points 0 points  What time? 0 points 0 points 0 points 0 points 0 points  Count back from 20 0 points 0 points 0 points 0 points 0 points  Months in reverse 2 points 0 points 0 points 0 points 0 points  Repeat phrase 0 points 0 points 0 points 0 points   Total Score 2 points 0 points 0 points 0 points     Immunizations Immunization History  Administered Date(s) Administered   Fluad Quad(high Dose 65+) 12/19/2020   Influenza-Unspecified 04/09/2018   PFIZER(Purple Top)SARS-COV-2 Vaccination 05/23/2019, 06/17/2019, 01/07/2020    Screening Tests Health Maintenance  Topic Date Due   DTaP/Tdap/Td (1 - Tdap) Never done   Pneumococcal Vaccine: 50+ Years (1 of 2 - PCV) Never done   Zoster Vaccines- Shingrix (1 of 2) Never done   COVID-19 Vaccine (4 - 2024-25 season) 12/09/2022   INFLUENZA VACCINE  11/08/2023   OPHTHALMOLOGY EXAM  12/08/2023   HEMOGLOBIN A1C  12/13/2023   FOOT EXAM  03/11/2024   Medicare Annual Wellness (AWV)  10/17/2024   Hepatitis B Vaccines  Aged Out   HPV VACCINES  Aged Out   Meningococcal B Vaccine  Aged Out   DEXA SCAN  Discontinued    Health Maintenance  Health Maintenance Due  Topic Date Due   DTaP/Tdap/Td (1 - Tdap) Never done   Pneumococcal Vaccine: 50+ Years (1 of 2 - PCV) Never done   Zoster Vaccines- Shingrix (1 of 2) Never done   COVID-19 Vaccine (4 - 2024-25 season) 12/09/2022   Health Maintenance Items Addressed: Due for TDAP, shingles, pneumonia and covid vaccines  Additional Screening:  Vision Screening: Recommended annual ophthalmology exams for early detection of glaucoma and other disorders of the eye. Would you like a referral to an eye doctor? No    Dental Screening: Recommended  annual dental exams for proper oral hygiene  Community Resource Referral / Chronic Care Management: CRR required this visit?  No   CCM required this visit?  No   Plan:    I have personally reviewed and noted the following in the patient's chart:   Medical and social history Use of alcohol, tobacco or illicit drugs  Current medications and supplements including opioid prescriptions. Patient is not currently taking opioid prescriptions. Functional ability and status Nutritional status Physical activity Advanced directives List of other physicians Hospitalizations, surgeries, and ER visits in previous 12 months Vitals Screenings to include cognitive, depression, and falls Referrals and appointments  In addition, I have reviewed and discussed with patient certain preventive protocols, quality metrics, and best practice recommendations. A written personalized care plan for preventive services as well as general preventive health recommendations were provided to patient.   Ardella FORBES Dawn, LPN   [id number]/[id number]   After Visit Summary: (Pick Up) Due to this being a telephonic visit, with patients personalized plan was offered to patient and patient has requested to Pick up at office.  Notes: Nothing significant to report at this time.

## 2023-10-18 NOTE — Patient Instructions (Addendum)
 Whitney Davidson , Thank you for taking time out of your busy schedule to complete your Annual Wellness Visit with me. I enjoyed our conversation and look forward to speaking with you again next year. I, as well as your care team,  appreciate your ongoing commitment to your health goals. Please review the following plan we discussed and let me know if I can assist you in the future. Your Game plan/ To Do List    Referrals: If you haven't heard from the office you've been referred to, please reach out to them at the phone provided.  N/a Follow up Visits: Next Medicare AWV with our clinical staff: 10/23/2024 at 4:20   Have you seen your provider in the last 6 months (3 months if uncontrolled diabetes)? Yes Next Office Visit with your provider: 12/13/2023 at 10:00  Clinician Recommendations:  Aim for 30 minutes of exercise or brisk walking, 6-8 glasses of water, and 5 servings of fruits and vegetables each day.       This is a list of the screening recommended for you and due dates:  Health Maintenance  Topic Date Due   DTaP/Tdap/Td vaccine (1 - Tdap) Never done   Pneumococcal Vaccine for age over 41 (1 of 2 - PCV) Never done   Zoster (Shingles) Vaccine (1 of 2) Never done   COVID-19 Vaccine (4 - 2024-25 season) 12/09/2022   Flu Shot  11/08/2023   Eye exam for diabetics  12/08/2023   Hemoglobin A1C  12/13/2023   Complete foot exam   03/11/2024   Medicare Annual Wellness Visit  10/17/2024   Hepatitis B Vaccine  Aged Out   HPV Vaccine  Aged Out   Meningitis B Vaccine  Aged Out   DEXA scan (bone density measurement)  Discontinued    Advanced directives: (Copy Requested) Please bring a copy of your health care power of attorney and living will to the office to be added to your chart at your convenience. You can mail to [facility] [address] [place], [place] [address] or email to [email or web address] Advance Care Planning is important because it:  [x]  Makes sure you  receive the medical care that is consistent with your values, goals, and preferences  [x]  It provides guidance to your family and loved ones and reduces their decisional burden about whether or not they are making the right decisions based on your wishes.  Follow the link provided in your after visit summary or read over the paperwork we have mailed to you to help you started getting your Advance Directives in place. If you need assistance in completing these, please reach out to us  so that we can help you!  See attachments for Preventive Care and Fall Prevention Tips.

## 2023-10-22 ENCOUNTER — Ambulatory Visit: Payer: Medicare HMO | Admitting: Dermatology

## 2023-10-22 ENCOUNTER — Encounter: Payer: Self-pay | Admitting: Dermatology

## 2023-10-22 VITALS — BP 149/80

## 2023-10-22 DIAGNOSIS — L6681 Central centrifugal cicatricial alopecia: Secondary | ICD-10-CM

## 2023-10-22 DIAGNOSIS — L649 Androgenic alopecia, unspecified: Secondary | ICD-10-CM

## 2023-10-22 DIAGNOSIS — L219 Seborrheic dermatitis, unspecified: Secondary | ICD-10-CM | POA: Diagnosis not present

## 2023-10-22 MED ORDER — SAFETY SEAL MISCELLANEOUS MISC: 1.0000 "application " | Freq: Every morning | 3 refills | Status: DC

## 2023-10-22 NOTE — Patient Instructions (Addendum)
 Date: Tue Oct 22 2023  Hello Whitney Davidson,  Thank you for visiting today. Here is a summary of the key instructions:  - Medications:   - Continue using the topical hair growth treatment daily   - Start using finasteride 1% topical solution (added to current bottle)   - Continue using clobetasol drops for seborrheic dermatitis  - Supplements:   - Start taking Viviscal hair growth vitamin, 2 tablets daily   - Can be found at [employer] or online  - Hair Care:   - Use Pantene shampoo and conditioner for textured hair   - Make a leave-in conditioner by mixing conditioner with warm water in a spray bottle   - Stop using products that dry out your hair  - Follow-up:   - Return for follow-up appointment in 6 months  We look forward to seeing you at your next visit. If you have any questions or concerns before then, please do not hesitate to contact our office.  Warm regards,  Dr. Delon Lenis, Dermatology          Important Information  Due to recent changes in healthcare laws, you may see results of your pathology and/or laboratory studies on MyChart before the doctors have had a chance to review them. We understand that in some cases there may be results that are confusing or concerning to you. Please understand that not all results are received at the same time and often the doctors may need to interpret multiple results in order to provide you with the best plan of care or course of treatment. Therefore, we ask that you please give us  2 business days to thoroughly review all your results before contacting the office for clarification. Should we see a critical lab result, you will be contacted sooner.   If You Need Anything After Your Visit  If you have any questions or concerns for your doctor, please call our main line at [phone] If no one answers, please leave a voicemail as directed and we will return your call as soon as possible. Messages left after 4 pm will be answered the  following business day.   You may also send us  a message via MyChart. We typically respond to MyChart messages within 1-2 business days.  For prescription refills, please ask your pharmacy to contact our office. Our fax number is [phone].  If you have an urgent issue when the clinic is closed that cannot wait until the next business day, you can page your doctor at the number below.    Please note that while we do our best to be available for urgent issues outside of office hours, we are not available 24/7.   If you have an urgent issue and are unable to reach us , you may choose to seek medical care at your doctor's office, retail clinic, urgent care center, or emergency room.  If you have a medical emergency, please immediately call 911 or go to the emergency department. In the event of inclement weather, please call our main line at [phone] for an update on the status of any delays or closures.  Dermatology Medication Tips: Please keep the boxes that topical medications come in in order to help keep track of the instructions about where and how to use these. Pharmacies typically print the medication instructions only on the boxes and not directly on the medication tubes.   If your medication is too expensive, please contact our office at [phone] or send us  a message through MyChart.  We are unable to tell what your co-pay for medications will be in advance as this is different depending on your insurance coverage. However, we may be able to find a substitute medication at lower cost or fill out paperwork to get insurance to cover a needed medication.   If a prior authorization is required to get your medication covered by your insurance company, please allow us  1-2 business days to complete this process.  Drug prices often vary depending on where the prescription is filled and some pharmacies may offer cheaper prices.  The website www.goodrx.com contains coupons for  medications through different pharmacies. The prices here do not account for what the cost may be with help from insurance (it may be cheaper with your insurance), but the website can give you the price if you did not use any insurance.  - You can print the associated coupon and take it with your prescription to the pharmacy.  - You may also stop by our office during regular business hours and pick up a GoodRx coupon card.  - If you need your prescription sent electronically to a different pharmacy, notify our office through [facility] or by phone at [phone]

## 2023-10-22 NOTE — Progress Notes (Signed)
   Follow-Up Visit   Subjective  Whitney Davidson is a 89 y.o. female who presents for FOLLOW UP:  Patient was last evaluated on 06/05/23.   Androgenetic Alopecia w/ CCCA: Prescribed Medrock Minoxidil 8% Clobetasol 0.05% - apply daily QAM. Recommended Viviscal supplements - did not purchase. Patient reports sxs are better. Itch/Pain Score now 2 out of 10.   The following portions of the chart were reviewed this encounter and updated as appropriate: medications, allergies, medical history  Review of Systems:  No other skin or systemic complaints except as noted in HPI or Assessment and Plan.  Objective  Well appearing patient in no apparent distress; mood and affect are within normal limits.   A focused examination was performed of the following areas: scalp   Relevant exam findings are noted in the Assessment and Plan.                Assessment & Plan   ANDROGENETIC ALOPECIA w/ CCCA Exam: Diffuse thinning of the crown and widening of the midline part with retention of the frontal hairline  flared  - Assessment: Patient reports improvements in some areas of hair growth, but notes ongoing concerns with growth on the top of the head. Areas that were previously bare now show some regrowth. Some areas are scarred, indicating permanent loss of hair follicles. Patient's adherence to current topical treatment is inconsistent, missing a few days. Hair growth is noted to be slow but steady, with improvements in curl pattern and thickness observed. - Plan:    Continue current topical treatment, emphasizing daily application    Add finasteride 1% to current topical formulation    Recommend Viviscal supplement, 2 tablets daily    Advise use of Pantene shampoo and conditioner for textured hair    Instruct on making leave-in conditioner (mix conditioner with warm water in spray bottle)    Discontinue use of products that dry out hair  2. Seborrheic dermatitis - Assessment: Patient is  using clobetasol drops for management of seborrheic dermatitis. Treatment appears to be effective in controlling symptoms. - Plan:    Continue clobetasol drops as prescribed Long term medication management.  Patient is using long term (months to years) prescription medication  to control their dermatologic condition.  These medications require periodic monitoring to evaluate for efficacy and side effects and may require periodic laboratory monitoring.      No follow-ups on file.   Documentation: I have reviewed the above documentation for accuracy and completeness, and I agree with the above.  I, Shirron Maranda, CMA, am acting as scribe for [employer], DO.   Delon Lenis, DO

## 2023-10-23 ENCOUNTER — Encounter (INDEPENDENT_AMBULATORY_CARE_PROVIDER_SITE_OTHER): Admitting: Ophthalmology

## 2023-10-23 DIAGNOSIS — H353231 Exudative age-related macular degeneration, bilateral, with active choroidal neovascularization: Secondary | ICD-10-CM | POA: Diagnosis not present

## 2023-10-23 DIAGNOSIS — H43813 Vitreous degeneration, bilateral: Secondary | ICD-10-CM

## 2023-10-23 DIAGNOSIS — H35033 Hypertensive retinopathy, bilateral: Secondary | ICD-10-CM

## 2023-10-23 DIAGNOSIS — I10 Essential (primary) hypertension: Secondary | ICD-10-CM | POA: Diagnosis not present

## 2023-10-25 ENCOUNTER — Telehealth: Payer: Self-pay

## 2023-10-25 NOTE — Progress Notes (Signed)
   Telephone encounter was:  Successful.  Complex Care Management Note Care Guide Note  10/25/2023 Name: Whitney Davidson MRN: [id number] DOB: 05/23/1930  Whitney Davidson is a 89 y.o. year old female who is a primary care patient of Rudd, Garnette HERO, MD . The community resource team was consulted for assistance with Transportation Needs   SDOH screenings and interventions completed:  Yes  Social Drivers of Health From This Encounter   Transportation Needs: Unmet Transportation Needs (10/25/2023)   PRAPARE - Administrator, Civil Service (Medical): Yes    Lack of Transportation (Non-Medical): Yes    SDOH Interventions Today    Flowsheet Row Most Recent Value  SDOH Interventions   Transportation Interventions Community Resources Provided     Care guide performed the following interventions: Patient provided with information about care guide support team and interviewed to confirm resource needs.Pt is needing transportation resources to be mailed to her. pt no longer has transportation benefits with [employer]   Follow Up Plan:  No further follow up planned at this time. The patient has been provided with needed resources.  Encounter Outcome:  Patient Visit Completed    [facility]  [facility] Guide, Phone: [phone] Fax: [phone] Website: [email or web address]

## 2023-12-13 ENCOUNTER — Encounter: Payer: Self-pay | Admitting: Family Medicine

## 2023-12-13 ENCOUNTER — Ambulatory Visit: Admitting: Family Medicine

## 2023-12-13 VITALS — BP 136/72 | HR 70 | Temp 97.2°F | Ht 62.0 in | Wt 141.4 lb

## 2023-12-13 DIAGNOSIS — I10 Essential (primary) hypertension: Secondary | ICD-10-CM | POA: Diagnosis not present

## 2023-12-13 DIAGNOSIS — Z23 Encounter for immunization: Secondary | ICD-10-CM | POA: Diagnosis not present

## 2023-12-13 DIAGNOSIS — E1122 Type 2 diabetes mellitus with diabetic chronic kidney disease: Secondary | ICD-10-CM

## 2023-12-13 DIAGNOSIS — E782 Mixed hyperlipidemia: Secondary | ICD-10-CM

## 2023-12-13 DIAGNOSIS — I129 Hypertensive chronic kidney disease with stage 1 through stage 4 chronic kidney disease, or unspecified chronic kidney disease: Secondary | ICD-10-CM | POA: Diagnosis not present

## 2023-12-13 DIAGNOSIS — N1832 Chronic kidney disease, stage 3b: Secondary | ICD-10-CM | POA: Diagnosis not present

## 2023-12-13 LAB — GLUCOSE, RANDOM: Glucose, Bld: 90 mg/dL (ref 70–99)

## 2023-12-13 LAB — LIPID PANEL
Cholesterol: 165 mg/dL (ref 0–200)
HDL: 63.2 mg/dL (ref >39.00)
LDL Cholesterol: 88 mg/dL (ref 0–99)
NonHDL: 101.53
Total CHOL/HDL Ratio: 3
Triglycerides: 70 mg/dL (ref 0.0–149.0)
VLDL: 14 mg/dL (ref 0.0–40.0)

## 2023-12-13 LAB — HEMOGLOBIN A1C: Hgb A1c MFr Bld: 6.6 % — ABNORMAL HIGH (ref 4.6–6.5)

## 2023-12-13 NOTE — Assessment & Plan Note (Signed)
 BP is at goal. Continue losartan 25 mg daily.

## 2023-12-13 NOTE — Progress Notes (Signed)
 [facility] [address] [place] [address] Dept: [phone] Dept Fax: [phone]  Chronic Care Office Visit  Subjective:    Patient ID: Whitney Davidson, female    DOB: 1931/02/07, 89 y.o..   MRN: [id number]  Chief Complaint  Patient presents with   Diabetes    3 month f/u DM.   No concerns.    History of Present Illness:  Patient is in today for reassessment of chronic medical issues.  [name] has a history of hypertension. She is currently managed on losartan  25 mg daily. She also has Stage 3b CKD. This has been stable.   [name] has a history of hyperlipidemia. She is managed on pravastatin  20 mg daily.   [name] has a history of Type 2 diabetes.  This is currently diet-controlled.    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 She has prescribed a topical clobetasol and minoxidil treatment. She reports some gradual improvement in the hair thickness.   Past Medical History: Patient Active Problem List   Diagnosis Date Noted   Impacted cerumen of both ears 06/27/2023   Androgenetic alopecia 06/12/2023   Nocturnal leg cramps 03/12/2023   Central centrifugal cicatricial alopecia 09/07/2022   Allergic conjunctivitis of both eyes 06/08/2022   Meralgia paraesthetica, right 06/08/2022   Arthritis of left hip 11/15/2020   Type 2 diabetes mellitus with stage 3b chronic kidney disease and hypertension (HCC) 05/14/2019   Osteoporosis 07/19/2017   GERD (gastroesophageal reflux disease), on Omeprazole  10/15/2016   Macular degeneration 10/15/2016   Essential hypertension 09/01/2016   Hyperlipidemia 09/01/2016   LBBB (left bundle branch block) 09/01/2016   Chronic kidney disease, stage 3b (HCC) 09/01/2016   History reviewed. No pertinent surgical history. Family History  Problem Relation Age of Onset   Heart disease Mother    Stroke Father    Hypertension Father    Early death Brother     Diabetes Brother    Heart disease Brother    Heart disease Brother    Heart disease Paternal Grandmother    Cancer Maternal Aunt        Ovarian   Outpatient Medications Prior to Visit  Medication Sig Dispense Refill   clobetasol cream (TEMOVATE) 0.05 % Apply 1 Application topically 2 (two) times daily.     ketorolac (ACULAR) 0.5 % ophthalmic solution Place into the left eye.     losartan  (COZAAR ) 25 MG tablet TAKE 1 TABLET EVERY DAY 90 tablet 3   Omega-3 Fatty Acids (FISH OIL) 1000 MG CAPS Take 1,000 mg by mouth 2 (two) times daily.     omeprazole  (PRILOSEC) 40 MG capsule TAKE 1 CAPSULE EVERY DAY 90 capsule 3   pravastatin  (PRAVACHOL ) 20 MG tablet TAKE 1 TABLET EVERY DAY 90 tablet 3   Safety Seal Miscellaneous MISC Apply 1 application  topically in the morning. AA Gel with Minoxidil 8% Clobetasol 0.05% Finsteride 0.1% - apply to affected areas of the scalp every morning 30 g 3   selenium  sulfide (SELSUN ) 1 % LOTN Apply 1 Application topically 2 (two) times a week. 118 mL 0   trimethoprim-polymyxin b (POLYTRIM) ophthalmic solution INT 1 GTT INTO OS QID 2 DAYS AFTER EACH MONTHLY INJECTION  12   No facility-administered medications prior to visit.   Allergies  Allergen Reactions   Amlodipine  Other (See Comments)    Pedal edema   Lisinopril -Hydrochlorothiazide  Swelling    Angioedema   Penicillins     Has patient had  a PCN reaction causing immediate rash, facial/tongue/throat swelling, SOB or lightheadedness with hypotension: Yes Has patient had a PCN reaction causing severe rash involving mucus membranes or skin necrosis: Unknown Has patient had a PCN reaction that required hospitalization: Unknown Has patient had a PCN reaction occurring within the last 10 years: No If all of the above answers are NO, then may proceed with Cephalosporin use.   Objective:   Today's Vitals   12/13/23 1003  BP: 136/72  Pulse: 70  Temp: (!) 97.2 F (36.2 C)  TempSrc: Temporal  SpO2: 97%   Weight: 141 lb 6.4 oz (64.1 kg)  Height: 5' 2 (1.575 m)   Body mass index is 25.86 kg/m.   General: Well developed, well nourished. No acute distress. Psych: Alert and oriented. Normal mood and affect.  Health Maintenance Due  Topic Date Due   DTaP/Tdap/Td (1 - Tdap) Never done   Pneumococcal Vaccine: 50+ Years (1 of 2 - PCV) Never done   Zoster Vaccines- Shingrix (1 of 2) Never done   Influenza Vaccine  11/08/2023   OPHTHALMOLOGY EXAM  12/08/2023   HEMOGLOBIN A1C  12/13/2023     Assessment & Plan:   Problem List Items Addressed This Visit       Cardiovascular and Mediastinum   Essential hypertension   BP is at goal. Continue losartan  25 mg daily.      Type 2 diabetes mellitus with stage 3b chronic kidney disease and hypertension (HCC)   Diet-controlled. A1c has been in excellent control. I will recheck her A1c today.      Relevant Orders   Glucose, random   Hemoglobin A1c     Genitourinary   Chronic kidney disease, stage 3b (HCC)   Stable. Continue focus on blood pressure and glucose control, adequate hydration, and avoidance of nephrotoxic medications.         Other   Hyperlipidemia   I will check lipids today Continue pravastatin  20 mg daily.      Relevant Orders   Lipid panel   Other Visit Diagnoses       Need for immunization against influenza    -  Primary   Relevant Orders   Flu vaccine HIGH DOSE PF(Fluzone Trivalent) (Completed)       Return in about 4 months (around 04/13/2024) for Reassessment.   Garnette CHRISTELLA Simpler, MD

## 2023-12-13 NOTE — Assessment & Plan Note (Signed)
Stable. Continue focus on blood pressure and glucose control, adequate hydration, and avoidance of nephrotoxic medications.

## 2023-12-13 NOTE — Assessment & Plan Note (Addendum)
 I will check lipids today. Continue pravastatin 20 mg daily.

## 2023-12-13 NOTE — Assessment & Plan Note (Addendum)
 Diet-controlled. A1c has been in excellent control. I will recheck her A1c today.

## 2023-12-18 ENCOUNTER — Encounter (INDEPENDENT_AMBULATORY_CARE_PROVIDER_SITE_OTHER): Admitting: Ophthalmology

## 2023-12-18 DIAGNOSIS — H353231 Exudative age-related macular degeneration, bilateral, with active choroidal neovascularization: Secondary | ICD-10-CM

## 2023-12-18 DIAGNOSIS — H35033 Hypertensive retinopathy, bilateral: Secondary | ICD-10-CM

## 2023-12-18 DIAGNOSIS — I10 Essential (primary) hypertension: Secondary | ICD-10-CM

## 2023-12-18 DIAGNOSIS — H43813 Vitreous degeneration, bilateral: Secondary | ICD-10-CM | POA: Diagnosis not present

## 2024-01-22 DIAGNOSIS — H3530 Unspecified macular degeneration: Secondary | ICD-10-CM | POA: Diagnosis not present

## 2024-01-22 DIAGNOSIS — I10 Essential (primary) hypertension: Secondary | ICD-10-CM | POA: Diagnosis not present

## 2024-01-22 DIAGNOSIS — Z880 Allergy status to penicillin: Secondary | ICD-10-CM | POA: Diagnosis not present

## 2024-01-22 DIAGNOSIS — E785 Hyperlipidemia, unspecified: Secondary | ICD-10-CM | POA: Diagnosis not present

## 2024-01-22 DIAGNOSIS — K219 Gastro-esophageal reflux disease without esophagitis: Secondary | ICD-10-CM | POA: Diagnosis not present

## 2024-01-22 DIAGNOSIS — Z5982 Transportation insecurity: Secondary | ICD-10-CM | POA: Diagnosis not present

## 2024-01-22 DIAGNOSIS — E119 Type 2 diabetes mellitus without complications: Secondary | ICD-10-CM | POA: Diagnosis not present

## 2024-02-12 ENCOUNTER — Encounter (INDEPENDENT_AMBULATORY_CARE_PROVIDER_SITE_OTHER): Admitting: Ophthalmology

## 2024-02-12 DIAGNOSIS — H35033 Hypertensive retinopathy, bilateral: Secondary | ICD-10-CM

## 2024-02-12 DIAGNOSIS — H353231 Exudative age-related macular degeneration, bilateral, with active choroidal neovascularization: Secondary | ICD-10-CM | POA: Diagnosis not present

## 2024-02-12 DIAGNOSIS — H43813 Vitreous degeneration, bilateral: Secondary | ICD-10-CM | POA: Diagnosis not present

## 2024-02-12 DIAGNOSIS — I10 Essential (primary) hypertension: Secondary | ICD-10-CM | POA: Diagnosis not present

## 2024-04-13 ENCOUNTER — Ambulatory Visit (INDEPENDENT_AMBULATORY_CARE_PROVIDER_SITE_OTHER): Admitting: Family Medicine

## 2024-04-13 ENCOUNTER — Encounter: Payer: Self-pay | Admitting: Family Medicine

## 2024-04-13 VITALS — BP 154/80 | HR 78 | Temp 97.5°F | Ht 62.0 in

## 2024-04-13 DIAGNOSIS — E1122 Type 2 diabetes mellitus with diabetic chronic kidney disease: Secondary | ICD-10-CM | POA: Diagnosis not present

## 2024-04-13 DIAGNOSIS — E782 Mixed hyperlipidemia: Secondary | ICD-10-CM

## 2024-04-13 DIAGNOSIS — I10 Essential (primary) hypertension: Secondary | ICD-10-CM | POA: Diagnosis not present

## 2024-04-13 DIAGNOSIS — I129 Hypertensive chronic kidney disease with stage 1 through stage 4 chronic kidney disease, or unspecified chronic kidney disease: Secondary | ICD-10-CM

## 2024-04-13 DIAGNOSIS — N1832 Chronic kidney disease, stage 3b: Secondary | ICD-10-CM

## 2024-04-13 NOTE — Assessment & Plan Note (Signed)
 Diet-controlled. A1c has been in excellent control. I will await her next visit to recheck her A1c.

## 2024-04-13 NOTE — Assessment & Plan Note (Signed)
 I will check lipids today. Continue pravastatin 20 mg daily.

## 2024-04-13 NOTE — Assessment & Plan Note (Signed)
Stable. Continue focus on blood pressure and glucose control, adequate hydration, and avoidance of nephrotoxic medications.

## 2024-04-13 NOTE — Assessment & Plan Note (Signed)
 BP is high today, which has not been usual for Whitney Davidson. We will monitor this for now. Continue losartan  25 mg daily.

## 2024-04-13 NOTE — Progress Notes (Signed)
 " [facility] [address] [place] [address] Dept: [phone] Dept Fax: [phone]  Chronic Care Office Visit  Subjective:    Patient ID: Whitney Davidson, female    DOB: [birth year], 89 y.o..   MRN: [id number]  Chief Complaint  Patient presents with   Diabetes    4 month f/u DM/HTN.  No concerns.  Fasting today.    History of Present Illness:  Patient is in today for reassessment of chronic medical issues.  Whitney Davidson has a history of hypertension. She is currently managed on losartan  25 mg daily. She also has Stage 3b CKD. This has been stable.   Whitney Davidson has a history of hyperlipidemia. She is managed on pravastatin  20 mg daily.   Whitney Davidson has a history of Type 2 diabetes.  This is currently diet-controlled.   Past Medical History: Patient Active Problem List   Diagnosis Date Noted   Impacted cerumen of both ears 06/27/2023   Androgenetic alopecia 06/12/2023   Nocturnal leg cramps 03/12/2023   Central centrifugal cicatricial alopecia 09/07/2022   Allergic conjunctivitis of both eyes 06/08/2022   Meralgia paraesthetica, right 06/08/2022   Arthritis of left hip 11/15/2020   Type 2 diabetes mellitus with stage 3b chronic kidney disease and hypertension (HCC) 05/14/2019   Osteoporosis 07/19/2017   GERD (gastroesophageal reflux disease), on Omeprazole  10/15/2016   Macular degeneration 10/15/2016   Essential hypertension 09/01/2016   Hyperlipidemia 09/01/2016   LBBB (left bundle branch block) 09/01/2016   Chronic kidney disease, stage 3b (HCC) 09/01/2016   No past surgical history on file. Family History  Problem Relation Age of Onset   Heart disease Mother    Stroke Father    Hypertension Father    Early death Brother    Diabetes Brother    Heart disease Brother    Heart disease Brother    Heart disease Paternal Grandmother    Cancer Maternal Aunt        Ovarian   Outpatient Medications Prior to Visit   Medication Sig Dispense Refill   clobetasol cream (TEMOVATE) 0.05 % Apply 1 Application topically 2 (two) times daily.     ketorolac (ACULAR) 0.5 % ophthalmic solution Place into the left eye.     losartan  (COZAAR ) 25 MG tablet TAKE 1 TABLET EVERY DAY 90 tablet 3   Omega-3 Fatty Acids (FISH OIL) 1000 MG CAPS Take 1,000 mg by mouth 2 (two) times daily.     omeprazole  (PRILOSEC) 40 MG capsule TAKE 1 CAPSULE EVERY DAY 90 capsule 3   pravastatin  (PRAVACHOL ) 20 MG tablet TAKE 1 TABLET EVERY DAY 90 tablet 3   Safety Seal Miscellaneous MISC Apply 1 application  topically in the morning. AA Gel with Minoxidil 8% Clobetasol 0.05% Finsteride 0.1% - apply to affected areas of the scalp every morning 30 g 3   selenium  sulfide (SELSUN ) 1 % LOTN Apply 1 Application topically 2 (two) times a week. 118 mL 0   trimethoprim-polymyxin b (POLYTRIM) ophthalmic solution INT 1 GTT INTO OS QID 2 DAYS AFTER EACH MONTHLY INJECTION  12   No facility-administered medications prior to visit.   Allergies[1] Objective:   Today's Vitals   04/13/24 0959  BP: (!) 154/80  Pulse: 78  Temp: (!) 97.5 F (36.4 C)  TempSrc: Temporal  SpO2: 97%  Height: 5' 2 (1.575 m)   Body mass index is 25.86 kg/m.   General: Well developed, well nourished. No acute distress. Feet- Skin intact. No sign  of maceration between toes. Nails are normal. Dorsalis pedis and posterior tibial artery pulses   are normal. 5.07 monofilament testing normal. Psych: Alert and oriented. Normal mood and affect.  Health Maintenance Due  Topic Date Due   DTaP/Tdap/Td (1 - Tdap) Never done   Pneumococcal Vaccine: 50+ Years (1 of 2 - PCV) Never done   Zoster Vaccines- Shingrix (1 of 2) Never done     Assessment & Plan:   Problem List Items Addressed This Visit       Cardiovascular and Mediastinum   Essential hypertension - Primary   BP is high today, which has not been usual for Whitney Davidson. We will monitor this for now. Continue losartan  25 mg  daily.      Type 2 diabetes mellitus with stage 3b chronic kidney disease and hypertension (HCC)   Diet-controlled. A1c has been in excellent control. I will await her next visit to recheck her A1c.        Genitourinary   Chronic kidney disease, stage 3b (HCC)   Stable. Continue focus on blood pressure and glucose control, adequate hydration, and avoidance of nephrotoxic medications.         Other   Hyperlipidemia   I will check lipids today Continue pravastatin  20 mg daily.       Return in about 3 months (around 07/12/2024) for Reassessment.   Whitney CHRISTELLA Simpler, MD    [1]  Allergies Allergen Reactions   Amlodipine  Other (See Comments)    Pedal edema   Lisinopril -Hydrochlorothiazide  Swelling    Angioedema   Penicillins     Has patient had a PCN reaction causing immediate rash, facial/tongue/throat swelling, SOB or lightheadedness with hypotension: Yes Has patient had a PCN reaction causing severe rash involving mucus membranes or skin necrosis: Unknown Has patient had a PCN reaction that required hospitalization: Unknown Has patient had a PCN reaction occurring within the last 10 years: No If all of the above answers are NO, then may proceed with Cephalosporin use.   "

## 2024-04-15 ENCOUNTER — Encounter (INDEPENDENT_AMBULATORY_CARE_PROVIDER_SITE_OTHER): Admitting: Ophthalmology

## 2024-04-15 DIAGNOSIS — I10 Essential (primary) hypertension: Secondary | ICD-10-CM

## 2024-04-15 DIAGNOSIS — H353231 Exudative age-related macular degeneration, bilateral, with active choroidal neovascularization: Secondary | ICD-10-CM

## 2024-04-15 DIAGNOSIS — H35033 Hypertensive retinopathy, bilateral: Secondary | ICD-10-CM

## 2024-04-15 DIAGNOSIS — H43813 Vitreous degeneration, bilateral: Secondary | ICD-10-CM

## 2024-04-22 ENCOUNTER — Ambulatory Visit: Admitting: Dermatology

## 2024-04-22 ENCOUNTER — Encounter: Payer: Self-pay | Admitting: Dermatology

## 2024-04-22 DIAGNOSIS — L6681 Central centrifugal cicatricial alopecia: Secondary | ICD-10-CM

## 2024-04-22 DIAGNOSIS — L649 Androgenic alopecia, unspecified: Secondary | ICD-10-CM | POA: Diagnosis not present

## 2024-04-22 DIAGNOSIS — Z79899 Other long term (current) drug therapy: Secondary | ICD-10-CM | POA: Diagnosis not present

## 2024-04-22 MED ORDER — SAFETY SEAL MISCELLANEOUS MISC: 1.0000 "application " | Freq: Every morning | 11 refills | Status: AC

## 2024-04-22 NOTE — Progress Notes (Signed)
 "  Follow-Up Visit   Subjective  Whitney Davidson is a 89 y.o. female who presents for the following: CCCA and Androgenetic Alopecia  Patient present today for follow up visit for CCCA and Androgenetic Alopecia. Patient was last evaluated on 10/2023. At this visit patient was prescribed AA Gel with Minoxidil 8% Clobetasol 0.05% Finsteride 0.1%. Patient reports sxs are improving but not at goal. Patient denies medication changes.  The following portions of the chart were reviewed this encounter and updated as appropriate: medications, allergies, medical history  Review of Systems:  No other skin or systemic complaints except as noted in HPI or Assessment and Plan.  Objective  Well appearing patient in no apparent distress; mood and affect are within normal limits.  A focused examination was performed of the following areas: Scalp  Relevant exam findings are noted in the Assessment and Plan.                  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 Regrowth is challenging in the middle part due to scarring. Current treatment with topical medication is showing some regrowth of baby hairs, which are expected to grow longer and darker over time. The use of a supplement is also ongoing to potentially boost hair growth. - Continue topical medication application daily. - Switched topical medication to a liquid solution for easier application. - Continue taking the hair growth supplement. - Avoid heat styling tools above 350 degrees Fahrenheit to prevent hair breakage. - Consider using roller sets instead of heat styling tools. - Use hydrating hair products sparingly, once or twice a week, to avoid scalp issues.  Chronic condition with  ongoing hair thinning. Current treatment with topical medication is showing some regrowth of baby hairs, which are expected to grow longer and darker over time. The use of a supplement is also ongoing to potentially boost hair growth.  - Continue topical medication application daily. - Switched topical medication to a liquid solution for easier application. - Continue taking the hair growth supplement. - Avoid heat styling tools above 350 degrees Fahrenheit to prevent hair breakage. - Consider using roller sets instead of heat styling tools. - Use hydrating hair products sparingly, once or twice a week, to avoid scalp issues.  Long term medication management.  Patient is using long term (months to years) prescription medication  to control their dermatologic condition.  These medications require periodic monitoring to evaluate for efficacy and side effects and may require periodic laboratory monitoring.  ANDROGENETIC ALOPECIA   This Visit - Safety Seal Miscellaneous MISC - Apply 1 application  topically in the morning. Hormonic Hair Solution with Minoxidil 8% Clobetasol 0.05% Finsteride 0.1% - apply to affected areas of the scalp every morning CENTRAL CENTRIFUGAL CICATRICIAL ALOPECIA   This Visit - Safety Seal Miscellaneous MISC - Apply 1 application  topically in the morning. Hormonic Hair Solution with Minoxidil 8% Clobetasol 0.05% Finsteride 0.1% - apply to affected areas of the scalp every morning  Return in about 9 months (around 01/20/2025) for Androgenetic Alopecia F/U.  I, Jetta Ager, am acting as neurosurgeon for [employer], DO.  Documentation: I have reviewed the above documentation for accuracy and completeness, and I agree with the above.  Delon Lenis, DO   "

## 2024-04-22 NOTE — Patient Instructions (Addendum)
 VISIT SUMMARY:  During your visit, we discussed your hair loss and scalp issues. You have been diagnosed with central centrifugal cicatricial alopecia and androgenetic alopecia. We reviewed your current treatments and made some adjustments to help manage your conditions better.  YOUR PLAN:  -CENTRAL CENTRIFUGAL CICATRICIAL ALOPECIA and -ANDROGENETIC ALOPECIA: :  This is a chronic condition that causes scarring and hair loss, primarily affecting the frontal hairline. Regrowth is challenging in the middle part due to scarring.   Continue applying the topical compound medication daily, which has shown some regrowth of baby hairs. We have switched to a liquid solution for easier application.   Continue taking the hair growth supplement. Avoid using heat styling tools above 350 degrees Fahrenheit to prevent hair breakage, and consider using roller sets instead. Use hydrating hair products sparingly, once or twice a week, to avoid scalp issues.   Important Information   Due to recent changes in healthcare laws, you may see results of your pathology and/or laboratory studies on MyChart before the doctors have had a chance to review them. We understand that in some cases there may be results that are confusing or concerning to you. Please understand that not all results are received at the same time and often the doctors may need to interpret multiple results in order to provide you with the best plan of care or course of treatment. Therefore, we ask that you please give us  2 business days to thoroughly review all your results before contacting the office for clarification. Should we see a critical lab result, you will be contacted sooner.     If You Need Anything After Your Visit   If you have any questions or concerns for your doctor, please call our main line at [phone]. If no one answers, please leave a voicemail as directed and we will return your call as soon as possible. Messages left after  4 pm will be answered the following business day.    You may also send us  a message via MyChart. We typically respond to MyChart messages within 1-2 business days.  For prescription refills, please ask your pharmacy to contact our office. Our fax number is [phone].  If you have an urgent issue when the clinic is closed that cannot wait until the next business day, you can page your doctor at the number below.     Please note that while we do our best to be available for urgent issues outside of office hours, we are not available 24/7.    If you have an urgent issue and are unable to reach us , you may choose to seek medical care at your doctor's office, retail clinic, urgent care center, or emergency room.   If you have a medical emergency, please immediately call 911 or go to the emergency department. In the event of inclement weather, please call our main line at [phone] for an update on the status of any delays or closures.  Dermatology Medication Tips: Please keep the boxes that topical medications come in in order to help keep track of the instructions about where and how to use these. Pharmacies typically print the medication instructions only on the boxes and not directly on the medication tubes.   If your medication is too expensive, please contact our office at [phone] or send us  a message through MyChart.    We are unable to tell what your co-pay for medications will be in advance as this is different depending on your insurance coverage. However,  we may be able to find a substitute medication at lower cost or fill out paperwork to get insurance to cover a needed medication.    If a prior authorization is required to get your medication covered by your insurance company, please allow us  1-2 business days to complete this process.   Drug prices often vary depending on where the prescription is filled and some pharmacies may offer cheaper prices.   The website  www.goodrx.com contains coupons for medications through different pharmacies. The prices here do not account for what the cost may be with help from insurance (it may be cheaper with your insurance), but the website can give you the price if you did not use any insurance.  - You can print the associated coupon and take it with your prescription to the pharmacy.  - You may also stop by our office during regular business hours and pick up a GoodRx coupon card.  - If you need your prescription sent electronically to a different pharmacy, notify our office through [facility] or by phone at [phone]

## 2024-06-17 ENCOUNTER — Encounter (INDEPENDENT_AMBULATORY_CARE_PROVIDER_SITE_OTHER): Admitting: Ophthalmology

## 2024-07-14 ENCOUNTER — Ambulatory Visit: Admitting: Family Medicine

## 2024-10-23 ENCOUNTER — Ambulatory Visit

## 2025-01-26 ENCOUNTER — Ambulatory Visit: Admitting: Dermatology
